# Patient Record
Sex: Female | Born: 1950 | Race: White | Hispanic: No | Marital: Married | Smoking: Current every day smoker
Health system: Southern US, Community
[De-identification: ages and names within clinical notes are randomized; demographics above are authoritative.]

## PROBLEM LIST (undated history)

## (undated) DIAGNOSIS — E1140 Type 2 diabetes mellitus with diabetic neuropathy, unspecified: Secondary | ICD-10-CM

## (undated) DIAGNOSIS — I8390 Asymptomatic varicose veins of unspecified lower extremity: Secondary | ICD-10-CM

## (undated) DIAGNOSIS — I10 Essential (primary) hypertension: Secondary | ICD-10-CM

## (undated) DIAGNOSIS — G4710 Hypersomnia, unspecified: Secondary | ICD-10-CM

## (undated) DIAGNOSIS — M19041 Primary osteoarthritis, right hand: Secondary | ICD-10-CM

## (undated) DIAGNOSIS — F319 Bipolar disorder, unspecified: Secondary | ICD-10-CM

## (undated) DIAGNOSIS — J45909 Unspecified asthma, uncomplicated: Secondary | ICD-10-CM

## (undated) DIAGNOSIS — K589 Irritable bowel syndrome without diarrhea: Secondary | ICD-10-CM

## (undated) DIAGNOSIS — E785 Hyperlipidemia, unspecified: Secondary | ICD-10-CM

## (undated) DIAGNOSIS — M170 Bilateral primary osteoarthritis of knee: Secondary | ICD-10-CM

## (undated) DIAGNOSIS — M19072 Primary osteoarthritis, left ankle and foot: Secondary | ICD-10-CM

## (undated) DIAGNOSIS — G4730 Sleep apnea, unspecified: Secondary | ICD-10-CM

## (undated) DIAGNOSIS — M1990 Unspecified osteoarthritis, unspecified site: Secondary | ICD-10-CM

## (undated) DIAGNOSIS — M791 Myalgia, unspecified site: Secondary | ICD-10-CM

## (undated) DIAGNOSIS — E119 Type 2 diabetes mellitus without complications: Secondary | ICD-10-CM

## (undated) DIAGNOSIS — M19042 Primary osteoarthritis, left hand: Secondary | ICD-10-CM

## (undated) DIAGNOSIS — Z9114 Patient's other noncompliance with medication regimen: Secondary | ICD-10-CM

## (undated) DIAGNOSIS — Z91199 Patient's noncompliance with other medical treatment and regimen due to unspecified reason: Secondary | ICD-10-CM

## (undated) DIAGNOSIS — I739 Peripheral vascular disease, unspecified: Secondary | ICD-10-CM

## (undated) DIAGNOSIS — D649 Anemia, unspecified: Secondary | ICD-10-CM

## (undated) DIAGNOSIS — M5136 Other intervertebral disc degeneration, lumbar region: Principal | ICD-10-CM

## (undated) DIAGNOSIS — M069 Rheumatoid arthritis, unspecified: Secondary | ICD-10-CM

## (undated) DIAGNOSIS — N189 Chronic kidney disease, unspecified: Secondary | ICD-10-CM

## (undated) DIAGNOSIS — S42309A Unspecified fracture of shaft of humerus, unspecified arm, initial encounter for closed fracture: Secondary | ICD-10-CM

## (undated) DIAGNOSIS — M7730 Calcaneal spur, unspecified foot: Secondary | ICD-10-CM

## (undated) DIAGNOSIS — L309 Dermatitis, unspecified: Secondary | ICD-10-CM

## (undated) DIAGNOSIS — S0300XA Dislocation of jaw, unspecified side, initial encounter: Secondary | ICD-10-CM

## (undated) DIAGNOSIS — M19071 Primary osteoarthritis, right ankle and foot: Secondary | ICD-10-CM

## (undated) DIAGNOSIS — F988 Other specified behavioral and emotional disorders with onset usually occurring in childhood and adolescence: Secondary | ICD-10-CM

## (undated) DIAGNOSIS — I499 Cardiac arrhythmia, unspecified: Secondary | ICD-10-CM

## (undated) DIAGNOSIS — K219 Gastro-esophageal reflux disease without esophagitis: Secondary | ICD-10-CM

## (undated) DIAGNOSIS — R600 Localized edema: Secondary | ICD-10-CM

## (undated) HISTORY — DX: Rheumatoid arthritis, unspecified: M06.9

## (undated) HISTORY — PX: ELBOW SURGERY: SHX618

## (undated) HISTORY — DX: Myalgia, unspecified site: M79.10

## (undated) HISTORY — DX: Hypersomnia, unspecified: G47.10

## (undated) HISTORY — DX: Dermatitis, unspecified: L30.9

## (undated) HISTORY — DX: Unspecified asthma, uncomplicated: J45.909

## (undated) HISTORY — DX: Hyperlipidemia, unspecified: E78.5

## (undated) HISTORY — DX: Primary osteoarthritis, left ankle and foot: M19.072

## (undated) HISTORY — DX: Calcaneal spur, unspecified foot: M77.30

## (undated) HISTORY — DX: Localized edema: R60.0

## (undated) HISTORY — DX: Primary osteoarthritis, right hand: M19.041

## (undated) HISTORY — PX: BACK SURGERY: SHX140

## (undated) HISTORY — DX: Primary osteoarthritis, right ankle and foot: M19.071

## (undated) HISTORY — DX: Other intervertebral disc degeneration, lumbar region: M51.36

## (undated) HISTORY — DX: Asymptomatic varicose veins of unspecified lower extremity: I83.90

## (undated) HISTORY — DX: Peripheral vascular disease, unspecified: I73.9

## (undated) HISTORY — DX: Type 2 diabetes mellitus without complications: E11.9

## (undated) HISTORY — DX: Dislocation of jaw, unspecified side, initial encounter: S03.00XA

## (undated) HISTORY — DX: Type 2 diabetes mellitus with diabetic neuropathy, unspecified: E11.40

## (undated) HISTORY — DX: Sleep apnea, unspecified: G47.30

## (undated) HISTORY — DX: Irritable bowel syndrome, unspecified: K58.9

## (undated) HISTORY — DX: Gastro-esophageal reflux disease without esophagitis: K21.9

## (undated) HISTORY — DX: Other specified behavioral and emotional disorders with onset usually occurring in childhood and adolescence: F98.8

## (undated) HISTORY — DX: Unspecified osteoarthritis, unspecified site: M19.90

## (undated) HISTORY — DX: Primary osteoarthritis, left hand: M19.042

## (undated) HISTORY — PX: ENDOVENOUS ABLATION SAPHENOUS VEIN W/ LASER: SUR449

## (undated) HISTORY — DX: Essential (primary) hypertension: I10

## (undated) HISTORY — DX: Bilateral primary osteoarthritis of knee: M17.0

---

## 2006-07-15 ENCOUNTER — Ambulatory Visit: Payer: Self-pay | Admitting: Orthopedic Surgery

## 2006-07-30 DIAGNOSIS — E119 Type 2 diabetes mellitus without complications: Secondary | ICD-10-CM | POA: Insufficient documentation

## 2006-08-22 ENCOUNTER — Ambulatory Visit: Payer: Self-pay | Admitting: Orthopedic Surgery

## 2006-09-02 ENCOUNTER — Ambulatory Visit: Payer: Self-pay

## 2006-09-26 ENCOUNTER — Ambulatory Visit: Payer: Self-pay | Admitting: Orthopedic Surgery

## 2006-12-30 ENCOUNTER — Ambulatory Visit: Payer: Self-pay | Admitting: Orthopedic Surgery

## 2007-04-24 ENCOUNTER — Ambulatory Visit: Payer: Self-pay | Admitting: Orthopedic Surgery

## 2007-04-24 DIAGNOSIS — M24519 Contracture, unspecified shoulder: Secondary | ICD-10-CM | POA: Insufficient documentation

## 2007-07-28 ENCOUNTER — Ambulatory Visit: Payer: Self-pay | Admitting: Orthopedic Surgery

## 2008-12-15 ENCOUNTER — Ambulatory Visit: Payer: Self-pay | Admitting: Vascular Surgery

## 2009-02-05 HISTORY — PX: COLONOSCOPY: SHX174

## 2009-03-16 ENCOUNTER — Ambulatory Visit: Payer: Self-pay | Admitting: Vascular Surgery

## 2009-05-04 ENCOUNTER — Ambulatory Visit: Payer: Self-pay | Admitting: Vascular Surgery

## 2009-05-11 ENCOUNTER — Ambulatory Visit: Payer: Self-pay | Admitting: Vascular Surgery

## 2010-01-10 ENCOUNTER — Ambulatory Visit: Payer: Self-pay | Admitting: Vascular Surgery

## 2010-03-08 NOTE — Assessment & Plan Note (Signed)
Summary: 3 m RECHECK ROM RT SHOULDER/BCBS/MEDICAID/CAF    History of Present Illness: I saw Margaret [name] in the office today for a 3 month followup visit.  She is a 60 years old woman with the complaint of:  right shoulder pain and adhesive capulitis.  Patiient is here today to check her ROM.  Patient states that some movements still bothers her and she states that it bothers her when she sleeps and it aches at night.    Current Allergies: No known allergies   Past Medical History:    Reviewed history from 07/30/2006 and no changes required:       Diabetes       bipolar       left upper thigh pain  Past Surgical History:    Reviewed history from 07/30/2006 and no changes required:       c-section     Review of Systems  Neuro      negative   MS      no other complaints     Shoulder/Elbow Exam  General:    Well-developed, well-nourished, normal body habitus; no deformities, normal grooming.    Skin:    Intact, no scars, lesions, rashes, cafe au lait spots or bruising.    Vascular:    Radial, ulnar, brachial, and axillary pulses 2+ and symmetric; capillary refill less than 2 seconds; no evidence of ischemia, clubbing, or cyanosis.    Sensory:    Gross sensation intact in the upper extremities.    Motor:    Normal strength in the upper extremities.    Shoulder Exam:    Right:    Inspection:  Abnormal    Palpation:  Abnormal    Stability:  stable    Tenderness:  right deltoid    Swelling:  no    Range of Motion:       Flexion-Active: 120       Flexion-Passive: 150       External Rotation : 30  Impingement Sign NEER:    Right positive Apprehension Sign:    Right negative    Impression & Recommendations:  Problem # 1:  * PAIN IN RT SHOULDER AND UPPER ARM Assessment: Improved  Orders: Est. Patient Level III (13244)  Verbal consent obtained/The right shoulder was injected with depomedrol 40mg /cc and sensorcaine .25% . There were no  complications   Problem # 2:  CONTRACTURE OF SHOULDER JOINT (ICD-718.41) Assessment: Improved adhesive capsulitis  Orders: Est. Patient Level III (01027-25) Joint Aspirate / Injection, Large (20610) Depo- Medrol 40mg  (J1030)  Orders: Est. Patient Level III (36644) Joint Aspirate / Injection, Large (20610) Depo- Medrol 40mg  (J1030)    Patient Instructions: 1)  You have received an injection of cortisone today. You may experience increased pain at the injection site. Apply ice pack to the area for 20 minutes every 2 hours and take 2 xtra strength tylenol every 8 hours. This increased pain will usually resolve in 24 hours. The injection will take effect in 3-10 days.  2)  3 months     ]

## 2010-06-20 NOTE — Consult Note (Signed)
NEW PATIENT CONSULTATION   Margaret [name], Margaret [name]  DOB:  [birth year]                                       12/15/2008  ZOXWR#:[id number]   Patient presents today for complaints of right leg venous pathology.  She is a very pleasant 60 year old female with a many-year history of  progressive pain associated with her right leg varicosities.  She has  pain mostly related to her calf and the pretibial area.  She reports  that this is severe with prolonged standing.  It can occur at night as  well after standing for prolonged periods through the day and can awaken  her at night as well.  She does have some relief with elevation.  She  has not tried compression.  She does not have any history of DVT or  superficial thrombophlebitis.  Has not had any bleeding from these  areas.   Review of systems is noted for no history of cardiac disease.  She does  not have any new weight loss.  She does report some weight gain with  limited activity.  She denies any pulmonary dysfunction.  She does have  esophageal reflux and constipation.  Negative GU.  Vascular:  Does have  pain with her walking.  Neurologic is negative for headaches, seizures,  or neurologic changes.  Musculoskeletal is positive for right joint  pain, muscle pain.  Psychiatric is for depression, anxiety.  ENT is  negative.  Hematologic and skin is positive for a rash in her left arm,  otherwise negative.   PAST MEDICAL HISTORY:  Significant for non-insulin diabetes.  She does  have a history of bipolar disorder.   Family history is negative for premature atherosclerotic disease.  She  does have a brother with venous varicosities.   SOCIAL HISTORY:  She is married with 2 children.  She is a housewife.  She does smoke a pack of cigarettes per day and does not drink alcohol  on a regular basis.   PHYSICAL EXAMINATION:  In general, she is a moderately obese white  female in no acute distress.  HEENT:  Pupils are  equal, round and  reactive to light.  Conjunctivae are clear.  Neck shows no JVD, no  cervical adenopathy.  No bruits are present.  Lungs are clear  bilaterally.  She has no rales, rhonchi or wheezes.  Cardiovascular:  Regular rate and rhythm with no murmurs.  Her peripheral pulses are 2+  in her radial and dorsalis pedis bilaterally.  Abdomen:  Soft,  nontender.  Normal bowel sounds.  No masses.  Moderate obesity.  Musculoskeletal:  No major deformities or cyanosis.  Neurologic:  No  focal weaknesses or paresthesias.  Skin:  No ulcers.  She does have the  changes on her lower extremity, most markedly on her right of very  superficial telangiectasia but are above the skin.  These are not true  varicosities but extremely prominent telangiectasia.   She underwent venous duplex in our office, and this reveals no evidence  of DVT and no evidence of deep venous valvular incompetence.  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  Her saphenous vein is not aneurysmal.   I had a long discussion with patient and her husband, present.  I did  review her duplex with her  and also reviewed her workup, as described by  Dr. Sherryll Burger in his office notes, which I reviewed with her.  I have  recommended graduated compression garments for relief of her pain.  I  explained this is related to venous hypertension.  I also explained the  potential risk for bleeding from these very superficial telangiectasia.  Fortunately, this has not been present.  I explained this frequently  happens in the shower or bathtub and can be quite significant bleeding  and can be treated simply with pressure over the bleeding site.  We have  fitted her today for thigh-high graduated compression stockings.  She  knows to continue elevation when possible and will use ibuprofen for  discomfort.  I did explain that she is an excellent candidate for laser  ablation of her right  great saphenous vein should she fail conservative  treatment and discussed this option with her as well.  She will see [place]  again in 3 months for continued discussion.   Larina Earthly, M.D.  Electronically Signed   TFE/MEDQ  D:  12/15/2008  T:  12/16/2008  Job:  [id number]   cc:   Kirstie Peri, MD

## 2010-06-20 NOTE — Assessment & Plan Note (Signed)
OFFICE VISIT   CHARLOTTE, FIDALGO  DOB:  01-10-1951                                       03/16/2009  EAVWU#:[id number]   Patient presents today for continued followup of her right leg venous  hypertension.  She has worn her graduated compression garments for 3  months and does elevate her legs when possible and takes ibuprofen for  discomfort.   DICTATION ENDED AT THIS POINT.     Larina Earthly, M.D.  Electronically Signed   TFE/MEDQ  D:  03/16/2009  T:  03/16/2009  Job:  [id number]

## 2010-06-20 NOTE — Procedures (Signed)
DUPLEX DEEP VENOUS EXAM - LOWER EXTREMITY   INDICATION:  Status post right greater saphenous vein laser ablation.   HISTORY:  Edema:  No.  Trauma/Surgery:  Right greater saphenous vein laser ablation on  05/04/2009.  Pain:  Complaint of medial right thigh and knee tenderness.  PE:  No.  Previous DVT:  No.  Anticoagulants:  Other:   DUPLEX EXAM:                CFV   SFV   PopV  PTV    GSV                R  L  R  L  R  L  R   L  R  L  Thrombosis    o  o  o     o     o      +  Spontaneous   +  +  +     +     +      o  Phasic        +  +  +     +     +      o  Augmentation  +  +  +     +     +      o  Compressible  +  +  +     +     +      o  Competent     +  +  +     +     +      o   Legend:  + - yes  o - no  p - partial  D - decreased   IMPRESSION:  1. No evidence of right lower extremity deep venous thrombosis.  2. 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  3. No reflux was noted in the right saphenofemoral junction.       _____________________________  Larina Earthly, M.D.   CH/MEDQ  D:  05/11/2009  T:  05/11/2009  Job:  [id number]

## 2010-06-20 NOTE — Assessment & Plan Note (Signed)
OFFICE VISIT   DYAMOND, TOLOSA  DOB:  10-03-1950                                       01/10/2010  JXBJY#:[id number]   The patient presents today for follow-up of lower extremity venous  problems.  She is status post laser ablation of her right great  saphenous vein and stab phlebectomies and sclerotherapy for multiple  large telangiectasias over her right medial calf.  These were raised  above the skin and had had bleeding on 1 occasion.  She has had some  mild discomfort but this is improved since her procedure.  She does have  an area over the left calf which she is concerned about as well.  She  has a birthmark at this area which is approximately 1 to 2 cm in  diameter and has developed a telangiectasia below these, which are also  raised above the surface similar to the ones that have bled in the past.  Her past medical history is otherwise unchanged.  I did image her  saphenous veins with SonoSite and this showed a complete closure of her  saphenous vein on the right from the treatment site at just below her  knee proximally.  She does have some mild incompetence on the left vein,  and I have not recommended laser treatment at this time.  I did review  her prior pictures prior to the sclerotherapy.  She does have some  hemosiderin staining at the sclerotherapy site due to the large nest of  telangiectasia that were present.  These are no longer raised and are  markedly improved from her preprocedural period.  She is interested in  sclerotherapy to the area raised at her left medial calf and the areas  around her ankle on the right that were not treated before, and we will  coordinate this with Clementeen Hoof.  I will see her again on an as-needed  basis.     Larina Earthly, M.D.  Electronically Signed   TFE/MEDQ  D:  01/10/2010  T:  01/11/2010  Job:  [id number]

## 2010-06-20 NOTE — Assessment & Plan Note (Signed)
OFFICE VISIT   [name], Margaret [name]  DOB:  [birth year]                                       03/16/2009  ZOXWR#:[id number]   The patient presents today for continued followup of her right leg  venous hypertension.  She has worn graduated compression garments for 3  months.  She reports that she continues to have difficulty despite this  and really no movement.  She does elevate her legs when possible and  does take ibuprofen for discomfort.  She reports that the pain and  swelling of the varicosities make it difficult to do her basic housework  with prolonged standing.  Also was on a walking program and had to stop  this, the length and duration of the walking due to the discomfort.  She  also reports that her usual household activities including walking up  stairs are difficult due to pain as well.  Physical exam is unchanged.  She is a moderately obese white female in no acute distress.  Blood  pressure today is 165/88, heart rate is 109, respirations 16.  Her lower  extremities reveal marked tributary varicosities over her medial calf.  She also has a significant spider vein telangiectasia over her calves as  well.  She has clearly failed conservative treatment of her venous  hypertension and venous varicosities.  I have recommended that we  proceed with laser ablation of her right great saphenous vein and stab  phlebectomy of multiple tributary varicosities.  We will schedule this  at her earliest convenience.     Larina Earthly, M.D.  Electronically Signed   TFE/MEDQ  D:  03/16/2009  T:  03/16/2009  Job:  [id number]

## 2010-06-20 NOTE — Procedures (Signed)
LOWER EXTREMITY VENOUS REFLUX EXAM   INDICATION:  Bilateral lower extremity swelling, pain, and varicose  veins.   EXAM:  Using color-flow imaging and pulse Doppler spectral analysis, the  bilateral common femoral, superficial femoral, popliteal, posterior  tibial, greater and lesser saphenous veins are evaluated.  There is no  evidence suggesting deep venous insufficiency in the bilateral lower  extremities.   The bilateral saphenofemoral junction is not competent with Reflux of  >557milliseconds. The bilateral GSV's are not competent with Reflux of  >549milliseconds with the caliber as described below.   The bilateral proximal short saphenous veins demonstrate competency.   GSV Diameter (used if found to be incompetent only)                                            Right    Left  Proximal Greater Saphenous Vein           0.67 cm  0.60 cm  Proximal-to-mid-thigh                     0.61 cm  0.45 cm  Mid thigh                                 0.48 cm  0.47 cm  Mid-distal thigh                          cm       cm  Distal thigh                              0.55 cm  0.27 cm  Knee                                      0.48 cm  0.28 cm   IMPRESSION:  1. Bilateral greater saphenous vein Reflux with >553milliseconds is      identified with the caliber ranging from 0.48 cm to 0.67 cm knee to      groin on the right and 0.28 to 0.60 cm on the left.  2. The bilateral left greater saphenous vein is not aneurysmal.  3. The bilateral greater saphenous veins are not tortuous.  4. The deep venous system is competent.  5. An incompetent perforator on the right medial proximal calf and      posterior calves are present.          ___________________________________________  Larina Earthly, M.D.   CJ/MEDQ  D:  12/15/2008  T:  12/15/2008  Job:  [id number]

## 2010-06-20 NOTE — Assessment & Plan Note (Signed)
OFFICE VISIT   Margaret [name], Margaret [name]  DOB:  1950-05-29                                       05/04/2009  ZOXWR#:[id number]   This patient presents today for laser ablation of her right great  saphenous vein from below her knee to her saphenofemoral junction, stab  phlebectomy to multiple tributaries in her calf and also sclerotherapy  to areas of her calf that were also engorged and painful.  She has no  immediate complications and will be seen in 1 week for followup.     Larina Earthly, M.D.  Electronically Signed   TFE/MEDQ  D:  05/04/2009  T:  05/05/2009  Job:  [id number]

## 2010-06-20 NOTE — Assessment & Plan Note (Signed)
OFFICE VISIT   Margaret [name], Margaret [name]  DOB:  December 16, 1950                                       05/11/2009  ZOXWR#:[id number]   The patient presents today for one week followup of her laser ablation  of right great saphenous vein and stab phlebectomy of multiple  tributaries varicosities.  She has the usual amount of soreness at the  ablation site.  Her stab sites look quite good with mild bruising and  her sclerotherapy sites look quite good as well.  She underwent  noninvasive vascular laboratory studies in our office today and this  reveals ablation of her saphenous vein from just below her knee up to  her saphenofemoral junction with no evidence of DVT.  I am pleased with  her initial response as is the patient and plan to see her again in 6  weeks for final followup.     Larina Earthly, M.D.  Electronically Signed   TFE/MEDQ  D:  05/11/2009  T:  05/12/2009  Job:  [id number]   cc:   Kirstie Peri, MD

## 2010-08-18 ENCOUNTER — Other Ambulatory Visit: Payer: Self-pay | Admitting: Unknown Physician Specialty

## 2010-08-18 DIAGNOSIS — Z1231 Encounter for screening mammogram for malignant neoplasm of breast: Secondary | ICD-10-CM

## 2010-09-11 ENCOUNTER — Ambulatory Visit
Admission: RE | Admit: 2010-09-11 | Discharge: 2010-09-11 | Disposition: A | Discharge status: Home / Self Care | Payer: Medicare Other | Source: Ambulatory Visit | Attending: Unknown Physician Specialty | Admitting: Unknown Physician Specialty

## 2010-09-11 DIAGNOSIS — Z1231 Encounter for screening mammogram for malignant neoplasm of breast: Secondary | ICD-10-CM

## 2011-09-25 ENCOUNTER — Other Ambulatory Visit: Payer: Self-pay | Admitting: Unknown Physician Specialty

## 2011-09-25 DIAGNOSIS — Z1231 Encounter for screening mammogram for malignant neoplasm of breast: Secondary | ICD-10-CM

## 2011-10-11 ENCOUNTER — Ambulatory Visit
Admission: RE | Admit: 2011-10-11 | Discharge: 2011-10-11 | Disposition: A | Discharge status: Home / Self Care | Payer: Medicare Other | Source: Ambulatory Visit | Attending: Unknown Physician Specialty | Admitting: Unknown Physician Specialty

## 2011-10-11 DIAGNOSIS — Z1231 Encounter for screening mammogram for malignant neoplasm of breast: Secondary | ICD-10-CM

## 2012-10-16 ENCOUNTER — Other Ambulatory Visit: Payer: Self-pay

## 2012-10-16 DIAGNOSIS — Z1231 Encounter for screening mammogram for malignant neoplasm of breast: Secondary | ICD-10-CM

## 2012-11-17 ENCOUNTER — Ambulatory Visit: Payer: Medicare Other

## 2012-11-20 ENCOUNTER — Ambulatory Visit
Admission: RE | Admit: 2012-11-20 | Discharge: 2012-11-20 | Disposition: A | Discharge status: Home / Self Care | Payer: Medicare PPO | Source: Ambulatory Visit

## 2012-11-20 DIAGNOSIS — Z1231 Encounter for screening mammogram for malignant neoplasm of breast: Secondary | ICD-10-CM

## 2013-02-05 DIAGNOSIS — G4730 Sleep apnea, unspecified: Secondary | ICD-10-CM

## 2013-02-05 HISTORY — DX: Sleep apnea, unspecified: G47.30

## 2013-04-08 DIAGNOSIS — N903 Dysplasia of vulva, unspecified: Secondary | ICD-10-CM | POA: Insufficient documentation

## 2013-11-30 ENCOUNTER — Other Ambulatory Visit: Payer: Self-pay

## 2013-11-30 DIAGNOSIS — Z1231 Encounter for screening mammogram for malignant neoplasm of breast: Secondary | ICD-10-CM

## 2013-12-17 ENCOUNTER — Ambulatory Visit
Admission: RE | Admit: 2013-12-17 | Discharge: 2013-12-17 | Disposition: A | Discharge status: Home / Self Care | Payer: Medicare PPO | Source: Ambulatory Visit

## 2013-12-17 DIAGNOSIS — Z1231 Encounter for screening mammogram for malignant neoplasm of breast: Secondary | ICD-10-CM

## 2014-04-20 ENCOUNTER — Other Ambulatory Visit (HOSPITAL_COMMUNITY): Payer: Self-pay | Admitting: Internal Medicine

## 2014-04-20 DIAGNOSIS — N644 Mastodynia: Secondary | ICD-10-CM

## 2014-04-22 ENCOUNTER — Other Ambulatory Visit (HOSPITAL_COMMUNITY): Payer: Self-pay | Admitting: Internal Medicine

## 2014-04-22 ENCOUNTER — Ambulatory Visit (HOSPITAL_COMMUNITY)
Admission: RE | Admit: 2014-04-22 | Discharge: 2014-04-22 | Disposition: A | Discharge status: Home / Self Care | Payer: Medicare PPO | Source: Ambulatory Visit | Attending: Internal Medicine | Admitting: Internal Medicine

## 2014-04-22 DIAGNOSIS — N644 Mastodynia: Secondary | ICD-10-CM

## 2014-04-22 DIAGNOSIS — M25552 Pain in left hip: Secondary | ICD-10-CM

## 2014-10-26 ENCOUNTER — Encounter: Payer: Self-pay | Admitting: Vascular Surgery

## 2014-10-26 ENCOUNTER — Other Ambulatory Visit: Payer: Self-pay | Admitting: *Deleted

## 2014-10-26 DIAGNOSIS — M79605 Pain in left leg: Secondary | ICD-10-CM

## 2014-10-26 DIAGNOSIS — M79604 Pain in right leg: Secondary | ICD-10-CM

## 2014-10-27 ENCOUNTER — Encounter: Payer: Self-pay | Admitting: Vascular Surgery

## 2014-12-10 ENCOUNTER — Encounter: Payer: Self-pay | Admitting: Vascular Surgery

## 2014-12-14 ENCOUNTER — Ambulatory Visit (HOSPITAL_COMMUNITY)
Admission: RE | Admit: 2014-12-14 | Discharge: 2014-12-14 | Disposition: A | Discharge status: Home / Self Care | Payer: Medicare PPO | Source: Ambulatory Visit | Attending: Vascular Surgery | Admitting: Vascular Surgery

## 2014-12-14 ENCOUNTER — Ambulatory Visit (INDEPENDENT_AMBULATORY_CARE_PROVIDER_SITE_OTHER): Payer: Medicare PPO | Admitting: Vascular Surgery

## 2014-12-14 ENCOUNTER — Encounter: Payer: Self-pay | Admitting: Vascular Surgery

## 2014-12-14 VITALS — BP 122/68 | HR 89 | Temp 97.1°F | Resp 14 | Ht 62.5 in | Wt 198.0 lb

## 2014-12-14 DIAGNOSIS — M79605 Pain in left leg: Secondary | ICD-10-CM | POA: Diagnosis not present

## 2014-12-14 DIAGNOSIS — M25561 Pain in right knee: Secondary | ICD-10-CM | POA: Diagnosis not present

## 2014-12-14 DIAGNOSIS — M79604 Pain in right leg: Secondary | ICD-10-CM | POA: Diagnosis not present

## 2014-12-14 DIAGNOSIS — M25562 Pain in left knee: Secondary | ICD-10-CM | POA: Diagnosis not present

## 2014-12-14 NOTE — Progress Notes (Signed)
V[facility] and Vein Specialist of [facility]  Patient name: Margaret [name] MRN: [id number] DOB: 1950-12-23 Sex: female  REASON FOR CONSULT: Bilateral lower extremity pain and cyanosis to me from prior endovenous treatment of saphenous vein with ablation. This was approximately 2011. She presents now with multiple complaints regarding her lower extremities. She reports that she has tenderness in the discomfort and not to touch and at rest on both lower extremity 6 sitting throughout her legs down into her feet. She also reports that her feet remain cold to the touch and she has dependent rubor but this can occur with her legs elevated as well. She does have degenerative disc disease with back pain that this can extend into her legs as well. Has no history of heart disease and no history of lower from the arterial insufficiency. She is a diabetic. A portion she does smoke pack cigarettes per day    Past Medical History  Diagnosis Date  . Diabetes mellitus without complication (HCC)   . Arthritis   . Hyperlipidemia   . Peripheral vascular disease (HCC)   . Varicose veins   . Diabetic neuropathy (HCC)   . Benign essential hypertension   . Hypersomnia   . Gastroesophageal reflux disease   . IBS (irritable bowel syndrome)   . Eczema   . Sleep apnea   . Myalgia   . Asthma     Family History  Problem Relation Age of Onset  . Hypertension Father   . AAA (abdominal aortic aneurysm) Brother     SOCIAL HISTORY: Social History   Social History  . Marital Status: Married    Spouse Name: N/A  . Number of Children: N/A  . Years of Education: N/A   Occupational History  . Not on file.   Social History Main Topics  . Smoking status: Current Every Day Smoker -- 1.00 packs/day    Types: Cigarettes  . Smokeless tobacco: Never Used  . Alcohol Use: No  . Drug Use: No  . Sexual Activity: Not on file   Other Topics Concern  . Not on file   Social History Narrative    Allergies    Allergen Reactions  . Hydroxychloroquine Rash  . Metformin And Related Diarrhea  . Vicodin [Hydrocodone-Acetaminophen] Nausea Only    Current Outpatient Prescriptions  Medication Sig Dispense Refill  . ALPRAZolam (XANAX) 0.5 MG tablet Take 0.5 mg by mouth.    Marland Kitchen aspirin 81 MG tablet Take 81 mg by mouth daily.    Marland Kitchen atomoxetine (STRATTERA) 60 MG capsule Take 60 mg by mouth.    Marland Kitchen buPROPion (WELLBUTRIN XL) 300 MG 24 hr tablet Take 300 mg by mouth.    . diltiazem (TAZTIA XT) 180 MG 24 hr capsule Take 180 mg by mouth.    Marland Kitchen glimepiride (AMARYL) 4 MG tablet Take 4 mg by mouth daily with breakfast. Take 1/2  -1 (one) every morning per medication list from [facility] notes.    Marland Kitchen glimepiride (AMARYL) 4 MG tablet Take 4 mg by mouth.    . Insulin Pen Needle (NOVOFINE) 30G X 8 MM MISC Inject 1 packet into the skin daily.    . Liraglutide (VICTOZA) 18 MG/3ML SOPN Inject 1.8 mg into the skin.    Marland Kitchen lisinopril (PRINIVIL,ZESTRIL) 5 MG tablet Take 5 mg by mouth.    . metFORMIN (GLUCOPHAGE) 500 MG tablet     . METFORMIN HCL PO Take 500 mg by mouth 2 (two) times daily.    Marland Kitchen  pantoprazole (PROTONIX) 40 MG tablet Take 40 mg by mouth.    . sulfaSALAzine (AZULFIDINE) 500 MG EC tablet     . sulfaSALAzine (AZULFIDINE) 500 MG tablet Take 1,000 mg by mouth.    . topiramate (TOPAMAX) 200 MG tablet Take 200 mg by mouth.    . clotrimazole-betamethasone (LOTRISONE) cream Apply 1 application topically 2 (two) times daily.     No current facility-administered medications for this visit.    REVIEW OF SYSTEMS:  [X]  denotes positive finding, [ ]  denotes negative finding Cardiac  Comments:  Chest pain or chest pressure:    Shortness of breath upon exertion:    Short of breath when lying flat:    Irregular heart rhythm:        Vascular    Pain in calf, thigh, or hip brought on by ambulation:    Pain in feet at night that wakes you up from your sleep:     Blood clot in your veins:    Leg swelling:          Pulmonary    Oxygen at home:    Productive cough:     Wheezing:         Neurologic    Sudden weakness in arms or legs:     Sudden numbness in arms or legs:     Sudden onset of difficulty speaking or slurred speech:    Temporary loss of vision in one eye:     Problems with dizziness:         Gastrointestinal    Blood in stool:     Vomited blood:         Genitourinary    Burning when urinating:     Blood in urine:        Psychiatric    Major depression:  x       Hematologic    Bleeding problems:    Problems with blood clotting too easily:        Skin    Rashes or ulcers:        Constitutional    Fever or chills:      PHYSICAL EXAM: Filed Vitals:   12/14/14 0937  BP: 122/68  Pulse: 89  Temp: 97.1 F (36.2 C)  Resp: 14  Height: 5' 2.5" (1.588 m)  Weight: 198 lb (89.812 kg)  SpO2: 97%    GENERAL: The patient is a well-nourished female, in no acute distress. The vital signs are documented above. CARDIAC: There is a regular rate and rhythm.  VASCULAR: Palpable dorsalis pedis pulses bilaterally. 2+ radial pulses bilaterally PULMONARY: There is good air exchange bilaterally without wheezing or rales. ABDOMEN: Soft and non-tender with normal pitched bowel sounds.  MUSCULOSKELETAL: There are no major deformities NEUROLOGIC: No focal weakness or paresthesias are detected. SKIN: There are no ulcers or rashes noted. She does have a red discoloration in her feet and extending into her toes bilaterally. No ulceration PSYCHIATRIC: The patient has a normal affect.  DATA:  Noninvasive arterial studies reveal ankle arm index diminished at 0.70 on the right and normal at greater than 1 on the left. He does have monophasic flows of the tibial vessels on the right leg and normal biphasic and triphasic flows on the left.  MEDICAL ISSUES: Had long discussion with patient and her husband present. I did explain that she does have mild arterial insufficiency left leg and normal  arterial flow in her right leg. Her symptoms are equal right and left. He  does not have any significant swelling in her lower extremities. I do not feel this is related to venous hypertension. The digital waveforms in her toes bilaterally are flattened suggestive of either vasospasm or small vessel disease in the toes of her feet bilaterally. Explained there would be no treatment option for this. 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 I did reassure her that this is not limb threatening but do not see any options from a vascular stent for for further evaluation or treatment. She does have multiple other etiologies that would explain her leg pain. She will continue follow-up with her other providers and see [place] again on as-needed basis   Kalianne Fetting [facility] [employer]: [phone]

## 2015-01-12 ENCOUNTER — Other Ambulatory Visit: Payer: Self-pay | Admitting: Neurosurgery

## 2015-01-20 ENCOUNTER — Encounter (HOSPITAL_COMMUNITY)
Admission: RE | Admit: 2015-01-20 | Discharge: 2015-01-20 | Disposition: A | Discharge status: Home / Self Care | Payer: Medicare PPO | Source: Ambulatory Visit | Attending: Neurosurgery | Admitting: Neurosurgery

## 2015-01-20 ENCOUNTER — Encounter (HOSPITAL_COMMUNITY): Payer: Self-pay

## 2015-01-20 DIAGNOSIS — Z01818 Encounter for other preprocedural examination: Secondary | ICD-10-CM | POA: Diagnosis not present

## 2015-01-20 DIAGNOSIS — Z0183 Encounter for blood typing: Secondary | ICD-10-CM | POA: Diagnosis not present

## 2015-01-20 DIAGNOSIS — Z01812 Encounter for preprocedural laboratory examination: Secondary | ICD-10-CM | POA: Diagnosis not present

## 2015-01-20 DIAGNOSIS — M4310 Spondylolisthesis, site unspecified: Secondary | ICD-10-CM | POA: Insufficient documentation

## 2015-01-20 HISTORY — DX: Bipolar disorder, unspecified: F31.9

## 2015-01-20 HISTORY — DX: Patient's other noncompliance with medication regimen: Z91.14

## 2015-01-20 HISTORY — DX: Cardiac arrhythmia, unspecified: I49.9

## 2015-01-20 HISTORY — DX: Patient's noncompliance with other medical treatment and regimen due to unspecified reason: Z91.199

## 2015-01-20 HISTORY — DX: Rheumatoid arthritis, unspecified: M06.9

## 2015-01-20 LAB — CBC
HEMATOCRIT: 43.6 % (ref 36.0–46.0)
Hemoglobin: 14.4 g/dL (ref 12.0–15.0)
MCH: 32.6 pg (ref 26.0–34.0)
MCHC: 33 g/dL (ref 30.0–36.0)
MCV: 98.6 fL (ref 78.0–100.0)
Platelets: 224 10*3/uL (ref 150–400)
RBC: 4.42 MIL/uL (ref 3.87–5.11)
RDW: 13.1 % (ref 11.5–15.5)
WBC: 8.5 10*3/uL (ref 4.0–10.5)

## 2015-01-20 LAB — SURGICAL PCR SCREEN
MRSA, PCR: NEGATIVE
STAPHYLOCOCCUS AUREUS: NEGATIVE

## 2015-01-20 LAB — BASIC METABOLIC PANEL
Anion gap: 10 (ref 5–15)
BUN: 22 mg/dL — ABNORMAL HIGH (ref 6–20)
CALCIUM: 9.4 mg/dL (ref 8.9–10.3)
CO2: 20 mmol/L — AB (ref 22–32)
CREATININE: 0.95 mg/dL (ref 0.44–1.00)
Chloride: 112 mmol/L — ABNORMAL HIGH (ref 101–111)
GFR calc non Af Amer: >60 mL/min (ref >60)
GLUCOSE: 132 mg/dL — AB (ref 65–99)
Potassium: 4.2 mmol/L (ref 3.5–5.1)
Sodium: 142 mmol/L (ref 135–145)

## 2015-01-20 LAB — TYPE AND SCREEN
ABO/RH(D): A POS
ANTIBODY SCREEN: NEGATIVE

## 2015-01-20 LAB — ABO/RH: ABO/RH(D): A POS

## 2015-01-20 LAB — GLUCOSE, CAPILLARY: Glucose-Capillary: 142 mg/dL — ABNORMAL HIGH (ref 65–99)

## 2015-01-20 NOTE — Progress Notes (Signed)
Patient denies ever having seen a cardiologist for any reason or having any cardiac testing done.    Patient's PCP is Kirstie Peri, MD at [facility].  Patient sees Dr. Corliss Skains at [facility] for management of Rheumatoid Arthritis.

## 2015-01-20 NOTE — Pre-Procedure Instructions (Signed)
Huldah Marin [facility]  01/20/2015      [facility] [address], [place] - [address] Dol[place]is [address] [place] [address] Phone: [phone] Fax: [phone]    Your procedure is scheduled on Monday December 19th 2016 at 1210pm.  Report to [facility] Admitting at 1000 A.M.  Call this number if you have problems in the days leading up to your surgery:  [phone]  Call this number if you have problems the morning of surgery:  [phone]   Remember:  Do not eat food or drink liquids after midnight Sunday December 18th.  Take these medicines the morning of surgery with A SIP OF WATER alprazolam (xanax) if needed, atomoxetine (strattera) if needed, bupropion (wellbutrin) if needed, pantoprazole (protonix) if needed. Please be sure to take your diltiazem (taztia xt) and your topiramate (topamax) if you normally take them in the morning.   Please stop your sulfasalazine now.  STOP: ALL Vitamins, Supplements, Effient and Herbal Medications, Fish Oils, Aspirins, NSAIDs (Nonsteroidal Anti-inflammatories such as Ibuprofen, Aleve, or Advil), and Goody's/BC Powders 7 days prior to surgery, until after surgery as directed by your physician.    How to Manage Your Diabetes Before Surgery   Why is it important to control my blood sugar before and after surgery?   Improving blood sugar levels before and after surgery helps healing and can limit problems.  A way of improving blood sugar control is eating a healthy diet by:  - Eating less sugar and carbohydrates  - Increasing activity/exercise  - Talk with your doctor about reaching your blood sugar goals  High blood sugars (greater than 180 mg/dL) can raise your risk of infections and slow down your recovery so you will need to focus on controlling your diabetes during the weeks before surgery.  Make sure that the doctor who takes care of your diabetes knows about your planned surgery including the date and location.  How do  I manage my blood sugars before surgery?   Check your blood sugar at least 4 times a day, 2 days before surgery to make sure that they are not too high or low.   Check your blood sugar the morning of your surgery when you wake up and every 2               hours until you get to the Short-Stay unit.  If your blood sugar is less than 70 mg/dL, you will need to treat for low blood sugar by:  Treat a low blood sugar (less than 70 mg/dL) with 1/2 cup of clear juice (cranberry or apple), 4 glucose tablets, OR glucose gel.  Recheck blood sugar in 15 minutes after treatment (to make sure it is greater than 70 mg/dL).  If blood sugar is not greater than 70 mg/dL on re-check, call [phone] for further instructions.   Report your blood sugar to the Short-Stay nurse when you get to Short-Stay.  References:  [facility], 2007 "How to Manage your Diabetes Before and After Surgery".  What do I do about my diabetes medications?   Do not take oral diabetes medicines (pills) the morning of surgery.   THE MORNING OF SURGERY, Do not take any of your diabetes medications.  This includes: glimeripide (amaryl), liraglutide (victoza), and metformin (glucophage).      Do not take other diabetes injectables the day of surgery including Byetta, Victoza, Bydureon, and Trulicity.    If your CBG is  greater than 220 mg/dL, you may take 1/2 of your sliding scale (correction) dose of insulin.    Do not wear jewelry, make-up or nail polish.  Do not wear lotions, powders, or perfumes.  You may wear deodorant.  Do not shave 48 hours prior to surgery.  Men may shave face and neck.  Do not bring valuables to the hospital.  [facility] is not responsible for any belongings or valuables.  Contacts, dentures or bridgework may not be worn into surgery.  Leave your suitcase in the car.  After surgery it may be brought to your room.  For patients admitted to the hospital, discharge time  will be determined by your treatment team.  Patients discharged the day of surgery will not be allowed to drive home.    Please read over the following fact sheets that you were given. Pain Booklet, Coughing and Deep Breathing, Blood Transfusion Information, MRSA Information and Surgical Site Infection Prevention        Preparing for Surgery at [facility]  Before surgery, you can play an important role.  Because skin is not sterile, your skin needs to be as free of germs as possible.  You can reduce the number of germs on your skin by washing with CHG (chlorahexidine gluconate) Soap before surgery.  CHG is an antiseptic cleaner with kills germs and bonds with the skin to continue killing germs even after washing.   Please do not use if you have an allergy to CHG or antibacterial soaps.  If your skin becomes reddened/irritated stop using the CHG.  Do not shave (including legs and underarms) for at least 48 hours prior to first CHG shower.  It is okay to shave your face.  Please follow these instructions carefully:  1. Shower with CHG Soap the night before surgery and the morning of Surgery. 2. If you choose to wash your hair, wash your hair first as usual with your normal shampoo. 3. After you shampoo, rinse your hair and body thoroughly to remove the Shampoo. 4. Use CHG as you would any other liquid soap. You can apply chg directly to the skin and wash gently with scrungie or a clean washcloth. 5. Apply the CHG Soap to your body ONLY FROM THE NECK DOWN. Do not use on open wounds or open sores. Avoid contact with your eyes, ears, mouth and genitals (private parts). Wash genitals (private parts) with your normal soap. 6. Wash thoroughly, paying special attention to the area where your surgery will be performed. 7. Thoroughly rinse your body with warm water from the neck down. 8. DO NOT  shower/wash with your normal soap after using and rinsing off the CHG Soap. 9. Pat yourself dry with a clean towel.  10. Wear clean pajamas.  11. Place clean sheets on your bed the night of your first shower and do not sleep with pets.  Day of Surgery  Do not apply any lotions/deodorants the morning of surgery. Please wear clean clothes to the hospital/surgery center.

## 2015-01-21 LAB — HEMOGLOBIN A1C
Hgb A1c MFr Bld: 6.7 % — ABNORMAL HIGH (ref 4.8–5.6)
Mean Plasma Glucose: 146 mg/dL

## 2015-01-23 MED ORDER — CEFAZOLIN SODIUM-DEXTROSE 2-3 GM-% IV SOLR
2.0000 g | INTRAVENOUS | Status: AC
  Administered 2015-01-24: 2 g via INTRAVENOUS
  Filled 2015-01-23 (×2): qty 50

## 2015-01-24 ENCOUNTER — Encounter (HOSPITAL_COMMUNITY): Payer: Self-pay | Admitting: *Deleted

## 2015-01-24 ENCOUNTER — Encounter (HOSPITAL_COMMUNITY)
Admission: RE | Disposition: A | Discharge status: Home / Self Care | Payer: Self-pay | Source: Ambulatory Visit | Attending: Neurosurgery

## 2015-01-24 ENCOUNTER — Inpatient Hospital Stay (HOSPITAL_COMMUNITY): Payer: Medicare PPO | Admitting: Certified Registered Nurse Anesthetist

## 2015-01-24 ENCOUNTER — Inpatient Hospital Stay (HOSPITAL_COMMUNITY)
Admission: RE | Admit: 2015-01-24 | Discharge: 2015-01-25 | DRG: 460 | Disposition: A | Discharge status: Home / Self Care | Payer: Medicare PPO | Source: Ambulatory Visit | Attending: Neurosurgery | Admitting: Neurosurgery

## 2015-01-24 ENCOUNTER — Inpatient Hospital Stay (HOSPITAL_COMMUNITY): Payer: Medicare PPO

## 2015-01-24 DIAGNOSIS — Z9119 Patient's noncompliance with other medical treatment and regimen: Secondary | ICD-10-CM | POA: Diagnosis not present

## 2015-01-24 DIAGNOSIS — E1151 Type 2 diabetes mellitus with diabetic peripheral angiopathy without gangrene: Secondary | ICD-10-CM | POA: Diagnosis present

## 2015-01-24 DIAGNOSIS — M4316 Spondylolisthesis, lumbar region: Secondary | ICD-10-CM | POA: Diagnosis present

## 2015-01-24 DIAGNOSIS — Z888 Allergy status to other drugs, medicaments and biological substances status: Secondary | ICD-10-CM

## 2015-01-24 DIAGNOSIS — E785 Hyperlipidemia, unspecified: Secondary | ICD-10-CM | POA: Diagnosis present

## 2015-01-24 DIAGNOSIS — M069 Rheumatoid arthritis, unspecified: Secondary | ICD-10-CM | POA: Diagnosis present

## 2015-01-24 DIAGNOSIS — J45909 Unspecified asthma, uncomplicated: Secondary | ICD-10-CM | POA: Diagnosis present

## 2015-01-24 DIAGNOSIS — M79606 Pain in leg, unspecified: Secondary | ICD-10-CM | POA: Diagnosis present

## 2015-01-24 DIAGNOSIS — M4806 Spinal stenosis, lumbar region: Secondary | ICD-10-CM | POA: Diagnosis present

## 2015-01-24 DIAGNOSIS — K589 Irritable bowel syndrome without diarrhea: Secondary | ICD-10-CM | POA: Diagnosis present

## 2015-01-24 DIAGNOSIS — I10 Essential (primary) hypertension: Secondary | ICD-10-CM | POA: Diagnosis present

## 2015-01-24 DIAGNOSIS — F17200 Nicotine dependence, unspecified, uncomplicated: Secondary | ICD-10-CM | POA: Diagnosis present

## 2015-01-24 DIAGNOSIS — M1990 Unspecified osteoarthritis, unspecified site: Secondary | ICD-10-CM | POA: Diagnosis present

## 2015-01-24 DIAGNOSIS — Z885 Allergy status to narcotic agent status: Secondary | ICD-10-CM

## 2015-01-24 DIAGNOSIS — G4730 Sleep apnea, unspecified: Secondary | ICD-10-CM | POA: Diagnosis present

## 2015-01-24 DIAGNOSIS — K219 Gastro-esophageal reflux disease without esophagitis: Secondary | ICD-10-CM | POA: Diagnosis present

## 2015-01-24 DIAGNOSIS — E1140 Type 2 diabetes mellitus with diabetic neuropathy, unspecified: Secondary | ICD-10-CM | POA: Diagnosis present

## 2015-01-24 DIAGNOSIS — Z419 Encounter for procedure for purposes other than remedying health state, unspecified: Secondary | ICD-10-CM

## 2015-01-24 DIAGNOSIS — Z79899 Other long term (current) drug therapy: Secondary | ICD-10-CM | POA: Diagnosis not present

## 2015-01-24 DIAGNOSIS — Z7984 Long term (current) use of oral hypoglycemic drugs: Secondary | ICD-10-CM | POA: Diagnosis not present

## 2015-01-24 DIAGNOSIS — Z7982 Long term (current) use of aspirin: Secondary | ICD-10-CM | POA: Diagnosis not present

## 2015-01-24 DIAGNOSIS — F319 Bipolar disorder, unspecified: Secondary | ICD-10-CM | POA: Diagnosis present

## 2015-01-24 LAB — GLUCOSE, CAPILLARY
GLUCOSE-CAPILLARY: 164 mg/dL — AB (ref 65–99)
GLUCOSE-CAPILLARY: 206 mg/dL — AB (ref 65–99)

## 2015-01-24 LAB — CBC
HCT: 38.3 % (ref 36.0–46.0)
Hemoglobin: 12.4 g/dL (ref 12.0–15.0)
MCH: 32.2 pg (ref 26.0–34.0)
MCHC: 32.4 g/dL (ref 30.0–36.0)
MCV: 99.5 fL (ref 78.0–100.0)
PLATELETS: 181 10*3/uL (ref 150–400)
RBC: 3.85 MIL/uL — AB (ref 3.87–5.11)
RDW: 13.1 % (ref 11.5–15.5)
WBC: 14.3 10*3/uL — AB (ref 4.0–10.5)

## 2015-01-24 LAB — CREATININE, SERUM
CREATININE: 1.01 mg/dL — AB (ref 0.44–1.00)
GFR calc non Af Amer: 58 mL/min — ABNORMAL LOW (ref >60)

## 2015-01-24 SURGERY — POSTERIOR LUMBAR FUSION 1 LEVEL
Anesthesia: General | Site: Spine Lumbar

## 2015-01-24 MED ORDER — PROPOFOL 10 MG/ML IV BOLUS
INTRAVENOUS | Status: AC
  Filled 2015-01-24: qty 20

## 2015-01-24 MED ORDER — ALBUMIN HUMAN 5 % IV SOLN
INTRAVENOUS | Status: DC | PRN
  Administered 2015-01-24: 15:00:00 via INTRAVENOUS

## 2015-01-24 MED ORDER — DILTIAZEM HCL ER BEADS 180 MG PO CP24: 180.0000 mg | ORAL_CAPSULE | Freq: Every day | ORAL | Status: DC

## 2015-01-24 MED ORDER — BUPROPION HCL ER (XL) 300 MG PO TB24
300.0000 mg | ORAL_TABLET | Freq: Every day | ORAL | Status: DC
  Administered 2015-01-25: 300 mg via ORAL
  Filled 2015-01-24: qty 1

## 2015-01-24 MED ORDER — FENTANYL CITRATE (PF) 100 MCG/2ML IJ SOLN
25.0000 ug | INTRAMUSCULAR | Status: DC | PRN
  Administered 2015-01-24 (×3): 50 ug via INTRAVENOUS

## 2015-01-24 MED ORDER — ACETAMINOPHEN 325 MG PO TABS: 650.0000 mg | ORAL_TABLET | ORAL | Status: DC | PRN

## 2015-01-24 MED ORDER — GLYCOPYRROLATE 0.2 MG/ML IJ SOLN
INTRAMUSCULAR | Status: DC | PRN
  Administered 2015-01-24: 0.4 mg via INTRAVENOUS

## 2015-01-24 MED ORDER — TOPIRAMATE 100 MG PO TABS
200.0000 mg | ORAL_TABLET | Freq: Two times a day (BID) | ORAL | Status: DC
  Administered 2015-01-24 – 2015-01-25 (×2): 200 mg via ORAL
  Filled 2015-01-24 (×3): qty 2

## 2015-01-24 MED ORDER — SODIUM CHLORIDE 0.9 % IJ SOLN: 3.0000 mL | Freq: Two times a day (BID) | INTRAMUSCULAR | Status: DC

## 2015-01-24 MED ORDER — ALPRAZOLAM 0.5 MG PO TABS: 0.5000 mg | ORAL_TABLET | Freq: Two times a day (BID) | ORAL | Status: DC | PRN

## 2015-01-24 MED ORDER — ONDANSETRON HCL 4 MG/2ML IJ SOLN: 4.0000 mg | INTRAMUSCULAR | Status: DC | PRN

## 2015-01-24 MED ORDER — ATOMOXETINE HCL 60 MG PO CAPS
60.0000 mg | ORAL_CAPSULE | Freq: Every day | ORAL | Status: DC
  Administered 2015-01-25: 60 mg via ORAL
  Filled 2015-01-24: qty 1

## 2015-01-24 MED ORDER — GLIMEPIRIDE 4 MG PO TABS
4.0000 mg | ORAL_TABLET | Freq: Every day | ORAL | Status: DC
  Administered 2015-01-25: 4 mg via ORAL
  Filled 2015-01-24 (×2): qty 1

## 2015-01-24 MED ORDER — ONDANSETRON HCL 4 MG/2ML IJ SOLN
INTRAMUSCULAR | Status: DC | PRN
  Administered 2015-01-24: 4 mg via INTRAVENOUS

## 2015-01-24 MED ORDER — METHOCARBAMOL 500 MG PO TABS
500.0000 mg | ORAL_TABLET | Freq: Four times a day (QID) | ORAL | Status: DC | PRN
Start: 2015-01-24 — End: 2015-01-25
  Administered 2015-01-24 – 2015-01-25 (×3): 500 mg via ORAL
  Filled 2015-01-24 (×2): qty 1

## 2015-01-24 MED ORDER — OXYCODONE-ACETAMINOPHEN 5-325 MG PO TABS
ORAL_TABLET | ORAL | Status: AC
  Filled 2015-01-24: qty 1

## 2015-01-24 MED ORDER — BUPIVACAINE HCL (PF) 0.5 % IJ SOLN
INTRAMUSCULAR | Status: DC | PRN
  Administered 2015-01-24: 5 mL

## 2015-01-24 MED ORDER — SODIUM CHLORIDE 0.9 % IJ SOLN: 3.0000 mL | INTRAMUSCULAR | Status: DC | PRN

## 2015-01-24 MED ORDER — FENTANYL CITRATE (PF) 250 MCG/5ML IJ SOLN
INTRAMUSCULAR | Status: AC
  Filled 2015-01-24: qty 5

## 2015-01-24 MED ORDER — LIRAGLUTIDE 18 MG/3ML ~~LOC~~ SOPN: 1.8000 mg | PEN_INJECTOR | Freq: Every day | SUBCUTANEOUS | Status: DC

## 2015-01-24 MED ORDER — CEFAZOLIN SODIUM-DEXTROSE 2-3 GM-% IV SOLR
2.0000 g | Freq: Three times a day (TID) | INTRAVENOUS | Status: AC
  Administered 2015-01-24 – 2015-01-25 (×2): 2 g via INTRAVENOUS
  Filled 2015-01-24 (×2): qty 50

## 2015-01-24 MED ORDER — LIDOCAINE-EPINEPHRINE 1 %-1:100000 IJ SOLN
INTRAMUSCULAR | Status: DC | PRN
  Administered 2015-01-24: 5 mL

## 2015-01-24 MED ORDER — NEOSTIGMINE METHYLSULFATE 10 MG/10ML IV SOLN
INTRAVENOUS | Status: DC | PRN
Start: 2015-01-24 — End: 2015-01-24
  Administered 2015-01-24: 3 mg via INTRAVENOUS

## 2015-01-24 MED ORDER — FENTANYL CITRATE (PF) 100 MCG/2ML IJ SOLN
INTRAMUSCULAR | Status: DC | PRN
  Administered 2015-01-24 (×2): 50 ug via INTRAVENOUS
  Administered 2015-01-24: 100 ug via INTRAVENOUS
  Administered 2015-01-24 (×2): 50 ug via INTRAVENOUS

## 2015-01-24 MED ORDER — PANTOPRAZOLE SODIUM 40 MG PO TBEC
40.0000 mg | DELAYED_RELEASE_TABLET | Freq: Two times a day (BID) | ORAL | Status: DC
  Administered 2015-01-24 – 2015-01-25 (×2): 40 mg via ORAL
  Filled 2015-01-24 (×2): qty 1

## 2015-01-24 MED ORDER — LIDOCAINE HCL (CARDIAC) 20 MG/ML IV SOLN: INTRAVENOUS | Status: DC | PRN

## 2015-01-24 MED ORDER — BACITRACIN 50000 UNITS IM SOLR
INTRAMUSCULAR | Status: DC | PRN
  Administered 2015-01-24: 500 mL

## 2015-01-24 MED ORDER — SENNOSIDES-DOCUSATE SODIUM 8.6-50 MG PO TABS: 1.0000 | ORAL_TABLET | Freq: Every evening | ORAL | Status: DC | PRN

## 2015-01-24 MED ORDER — OXYCODONE-ACETAMINOPHEN 5-325 MG PO TABS
1.0000 | ORAL_TABLET | ORAL | Status: DC | PRN
  Administered 2015-01-24: 1 via ORAL
  Administered 2015-01-24 – 2015-01-25 (×3): 2 via ORAL
  Filled 2015-01-24 (×3): qty 2

## 2015-01-24 MED ORDER — MENTHOL 3 MG MT LOZG: 1.0000 | LOZENGE | OROMUCOSAL | Status: DC | PRN

## 2015-01-24 MED ORDER — ROCURONIUM BROMIDE 100 MG/10ML IV SOLN
INTRAVENOUS | Status: DC | PRN
  Administered 2015-01-24: 50 mg via INTRAVENOUS
  Administered 2015-01-24: 10 mg via INTRAVENOUS

## 2015-01-24 MED ORDER — FENTANYL CITRATE (PF) 100 MCG/2ML IJ SOLN
INTRAMUSCULAR | Status: AC
  Filled 2015-01-24: qty 2

## 2015-01-24 MED ORDER — EPHEDRINE SULFATE 50 MG/ML IJ SOLN
INTRAMUSCULAR | Status: DC | PRN
  Administered 2015-01-24 (×2): 5 mg via INTRAVENOUS

## 2015-01-24 MED ORDER — LISINOPRIL 5 MG PO TABS
5.0000 mg | ORAL_TABLET | Freq: Every day | ORAL | Status: DC
  Administered 2015-01-25: 5 mg via ORAL
  Filled 2015-01-24 (×2): qty 1

## 2015-01-24 MED ORDER — 0.9 % SODIUM CHLORIDE (POUR BTL) OPTIME
TOPICAL | Status: DC | PRN
  Administered 2015-01-24: 1000 mL

## 2015-01-24 MED ORDER — INSULIN ASPART 100 UNIT/ML ~~LOC~~ SOLN
0.0000 [IU] | Freq: Three times a day (TID) | SUBCUTANEOUS | Status: DC
  Administered 2015-01-25: 4 [IU] via SUBCUTANEOUS

## 2015-01-24 MED ORDER — METHOCARBAMOL 1000 MG/10ML IJ SOLN
500.0000 mg | Freq: Four times a day (QID) | INTRAVENOUS | Status: DC | PRN
  Filled 2015-01-24: qty 5

## 2015-01-24 MED ORDER — DOCUSATE SODIUM 100 MG PO CAPS
100.0000 mg | ORAL_CAPSULE | Freq: Two times a day (BID) | ORAL | Status: DC
  Administered 2015-01-24 – 2015-01-25 (×2): 100 mg via ORAL
  Filled 2015-01-24 (×2): qty 1

## 2015-01-24 MED ORDER — METFORMIN HCL 500 MG PO TABS
500.0000 mg | ORAL_TABLET | Freq: Two times a day (BID) | ORAL | Status: DC
  Administered 2015-01-25: 500 mg via ORAL
  Filled 2015-01-24: qty 1

## 2015-01-24 MED ORDER — MIDAZOLAM HCL 5 MG/5ML IJ SOLN
INTRAMUSCULAR | Status: DC | PRN
  Administered 2015-01-24: 2 mg via INTRAVENOUS

## 2015-01-24 MED ORDER — THROMBIN 20000 UNITS EX SOLR
CUTANEOUS | Status: DC | PRN
  Administered 2015-01-24: 20 mL via TOPICAL

## 2015-01-24 MED ORDER — SENNA 8.6 MG PO TABS
1.0000 | ORAL_TABLET | Freq: Two times a day (BID) | ORAL | Status: DC
  Administered 2015-01-24 – 2015-01-25 (×2): 8.6 mg via ORAL
  Filled 2015-01-24 (×2): qty 1

## 2015-01-24 MED ORDER — HEPARIN SODIUM (PORCINE) 5000 UNIT/ML IJ SOLN
5000.0000 [IU] | Freq: Three times a day (TID) | INTRAMUSCULAR | Status: DC
  Administered 2015-01-25: 5000 [IU] via SUBCUTANEOUS
  Filled 2015-01-24: qty 1

## 2015-01-24 MED ORDER — LACTATED RINGERS IV SOLN
INTRAVENOUS | Status: DC
  Administered 2015-01-24 (×2): via INTRAVENOUS

## 2015-01-24 MED ORDER — DILTIAZEM HCL ER COATED BEADS 180 MG PO CP24
180.0000 mg | ORAL_CAPSULE | Freq: Every day | ORAL | Status: DC
  Administered 2015-01-25: 180 mg via ORAL
  Filled 2015-01-24: qty 1

## 2015-01-24 MED ORDER — THROMBIN 5000 UNITS EX SOLR
OROMUCOSAL | Status: DC | PRN
  Administered 2015-01-24 (×2): 5 mL via TOPICAL

## 2015-01-24 MED ORDER — METHOCARBAMOL 500 MG PO TABS
ORAL_TABLET | ORAL | Status: AC
  Filled 2015-01-24: qty 1

## 2015-01-24 MED ORDER — MIDAZOLAM HCL 2 MG/2ML IJ SOLN
INTRAMUSCULAR | Status: AC
  Filled 2015-01-24: qty 2

## 2015-01-24 MED ORDER — ONDANSETRON HCL 4 MG/2ML IJ SOLN: 4.0000 mg | Freq: Once | INTRAMUSCULAR | Status: DC | PRN

## 2015-01-24 MED ORDER — ACETAMINOPHEN 650 MG RE SUPP: 650.0000 mg | RECTAL | Status: DC | PRN

## 2015-01-24 MED ORDER — SODIUM CHLORIDE 0.9 % IV SOLN: INTRAVENOUS | Status: DC

## 2015-01-24 MED ORDER — SULFASALAZINE 500 MG PO TABS
1000.0000 mg | ORAL_TABLET | Freq: Two times a day (BID) | ORAL | Status: DC
  Administered 2015-01-24 – 2015-01-25 (×2): 1000 mg via ORAL
  Filled 2015-01-24 (×3): qty 2

## 2015-01-24 MED ORDER — SODIUM CHLORIDE 0.9 % IV SOLN: 250.0000 mL | INTRAVENOUS | Status: DC

## 2015-01-24 MED ORDER — PROPOFOL 10 MG/ML IV BOLUS
INTRAVENOUS | Status: DC | PRN
  Administered 2015-01-24: 130 mg via INTRAVENOUS

## 2015-01-24 MED ORDER — MORPHINE SULFATE (PF) 2 MG/ML IV SOLN
1.0000 mg | INTRAVENOUS | Status: DC | PRN
  Administered 2015-01-24: 2 mg via INTRAVENOUS
  Administered 2015-01-24: 4 mg via INTRAVENOUS
  Filled 2015-01-24 (×2): qty 2

## 2015-01-24 MED ORDER — LIDOCAINE HCL (CARDIAC) 20 MG/ML IV SOLN
INTRAVENOUS | Status: DC | PRN
  Administered 2015-01-24: 100 mg via INTRAVENOUS

## 2015-01-24 MED ORDER — ZOLPIDEM TARTRATE 5 MG PO TABS: 5.0000 mg | ORAL_TABLET | Freq: Every evening | ORAL | Status: DC | PRN

## 2015-01-24 MED ORDER — PHENYLEPHRINE HCL 10 MG/ML IJ SOLN
INTRAMUSCULAR | Status: DC | PRN
  Administered 2015-01-24 (×2): 80 ug via INTRAVENOUS
  Administered 2015-01-24: 160 ug via INTRAVENOUS
  Administered 2015-01-24 (×3): 80 ug via INTRAVENOUS
  Administered 2015-01-24: 120 ug via INTRAVENOUS
  Administered 2015-01-24: 80 ug via INTRAVENOUS
  Administered 2015-01-24: 160 ug via INTRAVENOUS
  Administered 2015-01-24 (×2): 80 ug via INTRAVENOUS
  Administered 2015-01-24: 120 ug via INTRAVENOUS
  Administered 2015-01-24 (×5): 80 ug via INTRAVENOUS

## 2015-01-24 MED ORDER — INSULIN ASPART 100 UNIT/ML ~~LOC~~ SOLN
0.0000 [IU] | Freq: Every day | SUBCUTANEOUS | Status: DC
Start: 2015-01-24 — End: 2015-01-25
  Administered 2015-01-24: 2 [IU] via SUBCUTANEOUS

## 2015-01-24 MED ORDER — PHENOL 1.4 % MT LIQD
1.0000 | OROMUCOSAL | Status: DC | PRN
Start: 2015-01-24 — End: 2015-01-25

## 2015-01-24 SURGICAL SUPPLY — 73 items
BAG DECANTER FOR FLEXI CONT (MISCELLANEOUS) ×3 IMPLANT
BENZOIN TINCTURE PRP APPL 2/3 (GAUZE/BANDAGES/DRESSINGS) IMPLANT
BIT DRILL 3.5 POWEREASE (BIT) ×2 IMPLANT
BIT DRILL 3.5MM POWEREASE (BIT) ×1
BLADE CLIPPER SURG (BLADE) IMPLANT
BLADE SURG 11 STRL SS (BLADE) ×3 IMPLANT
BUR MATCHSTICK NEURO 3.0 LAGG (BURR) ×3 IMPLANT
BUR PRECISION FLUTE 5.0 (BURR) ×3 IMPLANT
CANISTER SUCT 3000ML PPV (MISCELLANEOUS) ×3 IMPLANT
CLOSURE WOUND 1/2 X4 (GAUZE/BANDAGES/DRESSINGS)
CONT SPEC 4OZ CLIKSEAL STRL BL (MISCELLANEOUS) ×3 IMPLANT
COVER BACK TABLE 60X90IN (DRAPES) ×3 IMPLANT
DECANTER SPIKE VIAL GLASS SM (MISCELLANEOUS) ×3 IMPLANT
DERMABOND ADVANCED (GAUZE/BANDAGES/DRESSINGS) ×2
DERMABOND ADVANCED .7 DNX12 (GAUZE/BANDAGES/DRESSINGS) ×1 IMPLANT
DEVICE INTERBODY ELEVATE 9X23 (Cage) ×6 IMPLANT
DRAPE C-ARM 42X72 X-RAY (DRAPES) ×3 IMPLANT
DRAPE C-ARMOR (DRAPES) ×3 IMPLANT
DRAPE LAPAROTOMY 100X72X124 (DRAPES) ×3 IMPLANT
DRAPE POUCH INSTRU U-SHP 10X18 (DRAPES) ×3 IMPLANT
DRAPE PROXIMA HALF (DRAPES) ×3 IMPLANT
DRAPE SURG 17X23 STRL (DRAPES) IMPLANT
DRSG OPSITE POSTOP 4X6 (GAUZE/BANDAGES/DRESSINGS) ×3 IMPLANT
DURAPREP 26ML APPLICATOR (WOUND CARE) ×3 IMPLANT
ELECT REM PT RETURN 9FT ADLT (ELECTROSURGICAL) ×3
ELECTRODE REM PT RTRN 9FT ADLT (ELECTROSURGICAL) ×1 IMPLANT
GAUZE SPONGE 4X4 12PLY STRL (GAUZE/BANDAGES/DRESSINGS) IMPLANT
GAUZE SPONGE 4X4 16PLY XRAY LF (GAUZE/BANDAGES/DRESSINGS) IMPLANT
GLOVE BIOGEL PI IND STRL 7.0 (GLOVE) ×3 IMPLANT
GLOVE BIOGEL PI IND STRL 7.5 (GLOVE) ×3 IMPLANT
GLOVE BIOGEL PI INDICATOR 7.0 (GLOVE) ×6
GLOVE BIOGEL PI INDICATOR 7.5 (GLOVE) ×6
GLOVE ECLIPSE 7.0 STRL STRAW (GLOVE) ×6 IMPLANT
GLOVE EXAM NITRILE LRG STRL (GLOVE) IMPLANT
GLOVE EXAM NITRILE MD LF STRL (GLOVE) IMPLANT
GLOVE EXAM NITRILE XL STR (GLOVE) IMPLANT
GLOVE EXAM NITRILE XS STR PU (GLOVE) IMPLANT
GLOVE SURG SS PI 6.5 STRL IVOR (GLOVE) ×9 IMPLANT
GLOVE SURG SS PI 7.0 STRL IVOR (GLOVE) ×12 IMPLANT
GOWN STRL REUS W/ TWL LRG LVL3 (GOWN DISPOSABLE) ×3 IMPLANT
GOWN STRL REUS W/ TWL XL LVL3 (GOWN DISPOSABLE) ×1 IMPLANT
GOWN STRL REUS W/TWL 2XL LVL3 (GOWN DISPOSABLE) IMPLANT
GOWN STRL REUS W/TWL LRG LVL3 (GOWN DISPOSABLE) ×6
GOWN STRL REUS W/TWL XL LVL3 (GOWN DISPOSABLE) ×2
HEMOSTAT POWDER KIT SURGIFOAM (HEMOSTASIS) ×6 IMPLANT
KIT BASIN OR (CUSTOM PROCEDURE TRAY) ×3 IMPLANT
KIT INFUSE XX SMALL 0.7CC (Orthopedic Implant) ×3 IMPLANT
KIT ROOM TURNOVER OR (KITS) ×3 IMPLANT
MILL MEDIUM DISP (BLADE) ×3 IMPLANT
NEEDLE HYPO 18GX1.5 BLUNT FILL (NEEDLE) IMPLANT
NEEDLE HYPO 25X1 1.5 SAFETY (NEEDLE) ×3 IMPLANT
NEEDLE SPNL 18GX3.5 QUINCKE PK (NEEDLE) ×3 IMPLANT
NS IRRIG 1000ML POUR BTL (IV SOLUTION) ×3 IMPLANT
PACK LAMINECTOMY NEURO (CUSTOM PROCEDURE TRAY) ×3 IMPLANT
PAD ARMBOARD 7.5X6 YLW CONV (MISCELLANEOUS) ×9 IMPLANT
ROD COBALT 47.5X35 (Rod) ×6 IMPLANT
SCREW 5.5X35MM (Screw) ×8 IMPLANT
SCREW BN 35X5.5XMA NS SPNE (Screw) ×4 IMPLANT
SCREW SET SOLERA (Screw) ×8 IMPLANT
SCREW SET SOLERA TI (Screw) ×4 IMPLANT
SPONGE LAP 4X18 X RAY DECT (DISPOSABLE) IMPLANT
SPONGE SURGIFOAM ABS GEL 100 (HEMOSTASIS) ×3 IMPLANT
STRIP BIOACTIVE VITOSS 25X52X4 (Orthopedic Implant) ×3 IMPLANT
STRIP CLOSURE SKIN 1/2X4 (GAUZE/BANDAGES/DRESSINGS) IMPLANT
SUT VIC AB 0 CT1 18XCR BRD8 (SUTURE) ×2 IMPLANT
SUT VIC AB 0 CT1 8-18 (SUTURE) ×4
SUT VICRYL 3-0 RB1 18 ABS (SUTURE) ×6 IMPLANT
SYR 3ML LL SCALE MARK (SYRINGE) IMPLANT
TOWEL OR 17X24 6PK STRL BLUE (TOWEL DISPOSABLE) ×3 IMPLANT
TOWEL OR 17X26 10 PK STRL BLUE (TOWEL DISPOSABLE) ×3 IMPLANT
TRAP SPECIMEN MUCOUS 40CC (MISCELLANEOUS) ×3 IMPLANT
TRAY FOLEY W/METER SILVER 14FR (SET/KITS/TRAYS/PACK) ×3 IMPLANT
WATER STERILE IRR 1000ML POUR (IV SOLUTION) ×3 IMPLANT

## 2015-01-24 NOTE — Anesthesia Preprocedure Evaluation (Addendum)
Anesthesia Evaluation  Patient identified by MRN, date of birth, ID band Patient awake    Reviewed: Allergy & Precautions, NPO status , Patient's Chart, lab work & pertinent test results  History of Anesthesia Complications Negative for: history of anesthetic complications  Airway Mallampati: III  TM Distance: >3 FB Neck ROM: Full    Dental no notable dental hx. (+) Dental Advisory Given, Poor Dentition   Pulmonary asthma , sleep apnea , Current Smoker,    Pulmonary exam normal breath sounds clear to auscultation       Cardiovascular hypertension, Pt. on medications Normal cardiovascular exam Rhythm:Regular Rate:Normal     Neuro/Psych PSYCHIATRIC DISORDERS Bipolar Disorder negative neurological ROS  negative psych ROS   GI/Hepatic Neg liver ROS, GERD  Medicated and Controlled,  Endo/Other  diabetesobesity  Renal/GU negative Renal ROS  negative genitourinary   Musculoskeletal  (+) Arthritis ,   Abdominal   Peds negative pediatric ROS (+)  Hematology negative hematology ROS (+)   Anesthesia Other Findings   Reproductive/Obstetrics negative OB ROS                           Anesthesia Physical Anesthesia Plan  ASA: II  Anesthesia Plan: General   Post-op Pain Management:    Induction: Intravenous  Airway Management Planned: Oral ETT  Additional Equipment:   Intra-op Plan:   Post-operative Plan: Extubation in OR  Informed Consent: I have reviewed the patients History and Physical, chart, labs and discussed the procedure including the risks, benefits and alternatives for the proposed anesthesia with the patient or authorized representative who has indicated his/her understanding and acceptance.   Dental advisory given  Plan Discussed with: CRNA  Anesthesia Plan Comments:         Anesthesia Quick Evaluation

## 2015-01-24 NOTE — Op Note (Signed)
PREOP DIAGNOSIS:  1. 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 Spondylolisthesis, L4-5  POSTOP DIAGNOSIS: Same  PROCEDURE: 1. L4 laminectomy with facetectomy for decompression of exiting nerve roots, more than would be required for placement of interbody graft 2. Placement of anterior interbody device - Medtronic expandable cage, 75mm x2 3. Posterior instrumentation using cortical pedicle screws at L4, L5 Medtronic Solera 5.5x65mm 4. Interbody arthrodesis, L4-5 5. Use of locally harvested bone autograft 6. Use of non-structural bone allograft - Vitoss BA 7. Use of BMP  SURGEON: Dr. Lisbeth Renshaw, MD  ASSISTANT: None  ANESTHESIA: General Endotracheal  EBL: 100cc  SPECIMENS: None  DRAINS: None  COMPLICATIONS: none immediate  CONDITION: Hemodynamically stable to PACU  HISTORY: Margaret [name] is a 64 y.o. female who has been followed in the outpatient clinic with back and leg pain related to stenosis and spondylolisthesis at L4-5. She attempted multiple conservative treatments and ultimately elected to proceed with surgical decompression and fusion. Risks and benefits were reviewed and consent was obtained.  PROCEDURE IN DETAIL: After informed consent was obtained and witnessed, the patient was brought to the operating room. After induction of general anesthesia, the patient was positioned on the operative table in the prone position. All pressure points were meticulously padded. Incision was then marked out and prepped and draped in the usual sterile fashion.  After timeout was conducted, skin was infiltrated with local anesthetic. Skin incision was then made sharply and Bovie electrocautery was used to dissect the subcutaneous tissue until the lumbodorsal fascia was identified and incised. The muscle was then elevated in the subperiosteal plane and the L4 lamina, inferior L3-4 and superior L4-5 facet complexes were identified. Self-retaining retractors were then placed.  Using intraoperative  fluoroscopy, entry points for bilateral L4cortical pedicle screws were identified. Pilot holes were then created under lateral fluoroscopy and tapped to 5.5 mm.  At this point attention was turned to decompression. Complete L4 laminectomy with facetectomy was completed using a combination of Kerrison rongeurs and a high-speed drill. I did not a significant amount of spondylotic tissue adherent to the dura on the left in the axilla of the L4 nerve root which was completely removed. Good decompression of the exiting and traversing roots was confirmed.  Disc space was then identified, incised, and using a combination of shavers, curettes and rongeurs, complete discectomy was completed. Endplates were prepared, and a 71mm expnadable cage was tapped into place and expanded to 49mm augmented with placement of bone harvested during decompression mixed with Vitoss. BMP was also placed in the interspace. Good position was confirmed with fluoroscopy.  At this point, the L5 cortical pedicle screws were drilled and tapped to 5.5 mm. Screws were then placed in L4 and L5, 5.63mm x 8mm. 82mm rod was then placed, set screws placed and final tightened. Final AP and lateral fluoroscopic images confirmed good position.  The wound was then irrigated with copious amounts of antibiotic saline, then closed in standard fashion using a combination of interrupted 0 and 3-0 Vicryl stitches in the muscular, fascial, and subcutaneous layers. Skin was then closed using standard Dermabond. Sterile dressing was then applied. The patient was then transferred to the stretcher, extubated, and taken to the postanesthesia care unit in stable hemodynamic condition.  At the end of the case all sponge, needle, cottonoid, and instrument counts were correct.

## 2015-01-24 NOTE — Transfer of Care (Signed)
Immediate Anesthesia Transfer of Care Note  Patient: Margaret [name]  Procedure(s) Performed: Procedure(s): LUMBAR FOUR-FIVE POSTERIOR LUMBAR INTERBODY FUSION WITH INTERBODY PROSTHESIS;POSTERIOR LATERAL ARTHRODESIS AND POSTERIOR NONSEGMENTAL INSTRUMENTATION (N/A)  Patient Location: PACU  Anesthesia Type:General  Level of Consciousness: awake, alert , oriented and patient cooperative  Airway & Oxygen Therapy: Patient Spontanous Breathing and Patient connected to nasal cannula oxygen  Post-op Assessment: Report given to RN, Post -op Vital signs reviewed and stable, Patient moving all extremities, Patient moving all extremities X 4 and Patient able to stick tongue midline  Post vital signs: Reviewed and stable  Last Vitals:  Filed Vitals:   01/24/15 1019  BP: 129/74  Pulse: 94  Temp: 36.3 C  Resp: 18    Complications: No apparent anesthesia complications

## 2015-01-24 NOTE — Anesthesia Postprocedure Evaluation (Signed)
Anesthesia Post Note  Patient: Margaret [name]  Procedure(s) Performed: Procedure(s) (LRB): LUMBAR FOUR-FIVE POSTERIOR LUMBAR INTERBODY FUSION WITH INTERBODY PROSTHESIS;POSTERIOR LATERAL ARTHRODESIS AND POSTERIOR NONSEGMENTAL INSTRUMENTATION (N/A)  Patient location during evaluation: PACU Anesthesia Type: General Level of consciousness: awake Pain management: pain level controlled Respiratory status: spontaneous breathing Cardiovascular status: stable    Last Vitals:  Filed Vitals:   01/24/15 1715 01/24/15 1730  BP: 112/67 114/65  Pulse: 84 76  Temp:    Resp: 16 14    Last Pain:  Filed Vitals:   01/24/15 1735  PainSc: Asleep                 EDWARDS,Aylan Bayona

## 2015-01-24 NOTE — H&P (Signed)
CC:  Back and leg pain  HPI: Margaret [name] is a 64 year old woman I'm seeing for back and leg pain. She was last seen a few months ago at which time she had been having fairly progressive back and leg pain consistent with neurogenic claudication. I referred her for a course of physical therapy and requested an MRI. She says that because her back and leg pain is so severe, she was not really able to even attempt physical therapy. In fact, she says that her husband has to assist her with walking of any length, even around their property. She is not really able to take NSAIDs because they cause problems with her stomach, and she has been taking tramadol, but this is not providing any significant relief of her pain.   PMH: Past Medical History  Diagnosis Date  . Diabetes mellitus without complication (HCC)   . Arthritis   . Hyperlipidemia   . Peripheral vascular disease (HCC)   . Varicose veins   . Diabetic neuropathy (HCC)   . Benign essential hypertension   . Hypersomnia   . Gastroesophageal reflux disease   . IBS (irritable bowel syndrome)   . Eczema   . Myalgia   . Asthma   . Dysrhythmia     takes diltiazem prescribed by medical doctor; has never cardiologist  . Noncompliance with CPAP treatment   . Sleep apnea 2015    home sleep study   . Rheumatoid arthritis (HCC)   . Bipolar disorder (HCC)     hospitalized at [facility] for manic episodes    PSH: Past Surgical History  Procedure Laterality Date  . Cesarean section    . Elbow surgery    . Endovenous ablation saphenous vein w/ laser      SH: Social History  Substance Use Topics  . Smoking status: Current Every Day Smoker -- 1.00 packs/day for 40 years    Types: Cigarettes  . Smokeless tobacco: Never Used  . Alcohol Use: No    MEDS: Prior to Admission medications   Medication Sig Start Date End Date Taking? Authorizing Provider  ALPRAZolam Prudy Feeler) 0.5 MG tablet Take 0.5 mg by mouth 2 (two) times daily as needed for  anxiety.    Yes Historical Provider, MD  aspirin 81 MG tablet Take 81 mg by mouth daily.   Yes Historical Provider, MD  atomoxetine (STRATTERA) 60 MG capsule Take 60 mg by mouth daily.    Yes Historical Provider, MD  buPROPion (WELLBUTRIN XL) 300 MG 24 hr tablet Take 300 mg by mouth daily.    Yes Historical Provider, MD  diltiazem (TAZTIA XT) 180 MG 24 hr capsule Take 180 mg by mouth daily.    Yes Historical Provider, MD  glimepiride (AMARYL) 4 MG tablet Take 4 mg by mouth daily with breakfast.    Yes Historical Provider, MD  Liraglutide (VICTOZA) 18 MG/3ML SOPN Inject 1.8 mg into the skin daily.    Yes Historical Provider, MD  lisinopril (PRINIVIL,ZESTRIL) 5 MG tablet Take 5 mg by mouth daily.    Yes Historical Provider, MD  metFORMIN (GLUCOPHAGE) 500 MG tablet Take 500 mg by mouth 2 (two) times daily with a meal.  11/04/14  Yes Historical Provider, MD  pantoprazole (PROTONIX) 40 MG tablet Take 40 mg by mouth 2 (two) times daily.    Yes Historical Provider, MD  sulfaSALAzine (AZULFIDINE) 500 MG tablet Take 1,000 mg by mouth 2 (two) times daily.    Yes Historical Provider, MD  topiramate (TOPAMAX) 200 MG  tablet Take 200 mg by mouth 2 (two) times daily.    Yes Historical Provider, MD    ALLERGY: Allergies  Allergen Reactions  . Hydroxychloroquine Rash  . Vicodin [Hydrocodone-Acetaminophen] Nausea Only    ROS: ROS  NEUROLOGIC EXAM: Awake, alert, oriented Memory and concentration grossly intact Speech fluent, appropriate CN grossly intact Motor exam: Upper Extremities Deltoid Bicep Tricep Grip  Right 5/5 5/5 5/5 5/5  Left 5/5 5/5 5/5 5/5   Lower Extremity IP Quad PF DF EHL  Right 5/5 5/5 5/5 5/5 5/5  Left 5/5 5/5 5/5 5/5 5/5   Sensation grossly intact to LT  [facility]: MRI of the lumbar spine dated 11-16 was reviewed. This demonstrates primary pathology at L4 L5, where there is grade 1 anterolisthesis. There is also severe multifactorial stenosis from broad-based disc bulge, facet  arthropathy, and ligamentous hypertrophy. There is mild to moderate central and lateral recess stenosis at L3 L4 primarily due to broad-based disc bulge.  IMPRESSION: 64 year old woman with back and leg pain likely related to severe stenosis and spondylolisthesis at L4 L5. She is unable to tolerate any significant activity, including physical therapy due to the pain. She is not getting any relief from oral medication.  PLAN: Proceed with L4-5 decompression/fusion  I did review the MRI findings with the patient and her husband. Options at this point were discussed including surgical decompression and fusion at L4 L5. Risks and benefits of the surgery were discussed, including nerve root injury leading to leg or foot weakness and or bowel and bladder dysfunction, CSF leak, bleeding, and infection. Possible outcomes of surgery were also discussed including the possibility of uncomplicated surgery but persistence of pain symptoms and the possiblity of accelerated adjacent level degeneration. The general risks of anesthesia were also reviewed including heart attack, stroke, and DVT/PE.   The patient under husband appear to understand our discussion, and agreed to proceed with surgery.  All questions were answered

## 2015-01-24 NOTE — Anesthesia Procedure Notes (Signed)
Procedure Name: Intubation Date/Time: 01/24/2015 1:37 PM Performed by: Adonis Housekeeper Pre-anesthesia Checklist: Patient identified, Emergency Drugs available, Suction available and Patient being monitored Patient Re-evaluated:Patient Re-evaluated prior to inductionOxygen Delivery Method: Circle system utilized Preoxygenation: Pre-oxygenation with 100% oxygen Intubation Type: IV induction Ventilation: Mask ventilation without difficulty, Oral airway inserted - appropriate to patient size and Two handed mask ventilation required Laryngoscope Size: Mac and 3 Grade View: Grade II Tube type: Oral Tube size: 7.0 mm Number of attempts: 1 Airway Equipment and Method: Stylet Placement Confirmation: ETT inserted through vocal cords under direct vision,  positive ETCO2 and breath sounds checked- equal and bilateral Secured at: 22 cm Tube secured with: Tape Dental Injury: Teeth and Oropharynx as per pre-operative assessment

## 2015-01-25 LAB — CBC
HEMATOCRIT: 36.6 % (ref 36.0–46.0)
HEMOGLOBIN: 12.6 g/dL (ref 12.0–15.0)
MCH: 33.4 pg (ref 26.0–34.0)
MCHC: 34.4 g/dL (ref 30.0–36.0)
MCV: 97.1 fL (ref 78.0–100.0)
Platelets: 197 10*3/uL (ref 150–400)
RBC: 3.77 MIL/uL — AB (ref 3.87–5.11)
RDW: 12.9 % (ref 11.5–15.5)
WBC: 13.2 10*3/uL — ABNORMAL HIGH (ref 4.0–10.5)

## 2015-01-25 LAB — BASIC METABOLIC PANEL
Anion gap: 9 (ref 5–15)
BUN: 17 mg/dL (ref 6–20)
CHLORIDE: 112 mmol/L — AB (ref 101–111)
CO2: 20 mmol/L — AB (ref 22–32)
CREATININE: 0.9 mg/dL (ref 0.44–1.00)
Calcium: 8.6 mg/dL — ABNORMAL LOW (ref 8.9–10.3)
GFR calc non Af Amer: >60 mL/min (ref >60)
Glucose, Bld: 185 mg/dL — ABNORMAL HIGH (ref 65–99)
POTASSIUM: 4.1 mmol/L (ref 3.5–5.1)
SODIUM: 141 mmol/L (ref 135–145)

## 2015-01-25 LAB — GLUCOSE, CAPILLARY: GLUCOSE-CAPILLARY: 184 mg/dL — AB (ref 65–99)

## 2015-01-25 MED ORDER — METHOCARBAMOL 500 MG PO TABS: 500.0000 mg | ORAL_TABLET | Freq: Four times a day (QID) | ORAL | Status: DC | PRN

## 2015-01-25 MED ORDER — OXYCODONE-ACETAMINOPHEN 5-325 MG PO TABS: 1.0000 | ORAL_TABLET | ORAL | Status: DC | PRN

## 2015-01-25 MED ORDER — ASPIRIN 81 MG PO TABS: 81.0000 mg | ORAL_TABLET | Freq: Every day | ORAL | Status: DC

## 2015-01-25 NOTE — Evaluation (Signed)
Occupational Therapy Evaluation Patient Details Name: Margaret [name] MRN: [id number] DOB: 06/02/1950 Today's Date: 01/25/2015    History of Present Illness 64 yo female s/p L4-5 fusion PMH: DM arthritis, PVD, neuropathy, IBS, asthma, bipolar   Clinical Impression   Patient evaluated by Occupational Therapy with no further acute OT needs identified. All education has been completed and the patient has no further questions. See below for any follow-up Occupational Therapy or equipment needs. OT to sign off. Thank you for referral.      Follow Up Recommendations  No OT follow up    Equipment Recommendations  None recommended by OT    Recommendations for Other Services       Precautions / Restrictions Precautions Precautions: Back Precaution Comments: handout provided and reviewed in detail      Mobility Bed Mobility Overal bed mobility: Needs Assistance Bed Mobility: Supine to Sit;Sit to Supine     Supine to sit: Min assist Sit to supine: Min assist   General bed mobility comments: (A) for bil LE into bed surface but able to complete proper sequence  Transfers Overall transfer level: Needs assistance   Transfers: Sit to/from Stand Sit to Stand: Supervision              Balance                                            ADL Overall ADL's : Needs assistance/impaired Eating/Feeding: Modified independent   Grooming: Wash/dry face;Oral care;Supervision/safety       Lower Body Bathing: Moderate assistance;Sit to/from stand       Lower Body Dressing: Moderate assistance;Sit to/from stand Lower Body Dressing Details (indicate cue type and reason): husband (A) at baseline and declined AE education. Pt does have reacher at home. Pt states "i am just not good with that stuff" Toilet Transfer: Min guard     Toileting - Clothing Manipulation Details (indicate cue type and reason): educated on avoiding bendign and twisting Tub/ Shower  Transfer: Supervision/safety;Walk-in Copywriter, advertising Details (indicate cue type and reason): spouse present and educated patient on use of warm water  Functional mobility during ADLs: Supervision/safety General ADL Comments: Pt and spouse provided hand out and reviewed in detail. educated on night time medication management, care for dogs at home x2 and proper sitting positioning     Vision     Perception     Praxis      Pertinent Vitals/Pain Pain Assessment: 0-10 Pain Score: 2  Pain Location: back pain at surgerical site Pain Descriptors / Indicators: Discomfort Pain Intervention(s): Repositioned;Monitored during session     Hand Dominance Right   Extremity/Trunk Assessment Upper Extremity Assessment Upper Extremity Assessment: Overall WFL for tasks assessed   Lower Extremity Assessment Lower Extremity Assessment: Defer to PT evaluation   Cervical / Trunk Assessment Cervical / Trunk Assessment: Other exceptions (s/p surg)   Communication Communication Communication: No difficulties   Cognition Arousal/Alertness: Awake/alert Behavior During Therapy: WFL for tasks assessed/performed Overall Cognitive Status: Within Functional Limits for tasks assessed                     General Comments       Exercises       Shoulder Instructions      Home Living Family/patient expects to be discharged to:: Private residence Living Arrangements: Spouse/significant other Available Help at  Discharge: Family;Available 24 hours/day Type of Home: House Home Access: Stairs to enter [employer] of Steps: 1 Entrance Stairs-Rails: Right Home Layout: Multi-level;Bed/bath upstairs Alternate Level Stairs-Number of Steps: 7 with rail Alternate Level Stairs-Rails: Can reach both Bathroom Shower/Tub: Producer, television/film/video: Standard     Home Equipment: None          Prior Functioning/Environment Level of Independence: Needs assistance     ADL's / Homemaking Assistance Needed: husband helps with dressing / bathing        OT Diagnosis: Generalized weakness;Acute pain   OT Problem List:     OT Treatment/Interventions:      OT Goals(Current goals can be found in the care plan section) Acute Rehab OT Goals OT Goal Formulation: With patient/family Potential to Achieve Goals: Good  OT Frequency:     Barriers to D/C:            Co-evaluation              End of Session Nurse Communication: Mobility status;Precautions  Activity Tolerance: Patient tolerated treatment well Patient left: in bed;with call bell/phone within reach;with family/visitor present   Time: 1610-9604 OT Time Calculation (min): 26 min Charges:  OT General Charges $OT Visit: 1 Procedure OT Evaluation $Initial OT Evaluation Tier I: 1 Procedure OT Treatments $Self Care/Home Management : 8-22 mins G-Codes:    Harolyn Rutherford 02/18/2015, 9:12 AM   Mateo Flow   OTR/L Pager: [phone] Office: [phone] .

## 2015-01-25 NOTE — Discharge Instructions (Signed)
Wound Care Keep incision covered and dry for two days.   Do not put any creams, lotions, or ointments on incision. Leave incision open to air. Activity Walk each and every day, increasing distance each day. No lifting greater than 5 lbs.  Avoid bending, lifting and twisting. No driving for 2 weeks; may ride as a passenger locally. If provided with back brace, wear when out of bed.  It is not necessary to wear brace in bed. Diet Resume your normal diet.  Return to Work Will be discussed at you follow up appointment. Call Your Doctor If Any of These Occur Redness, drainage, or swelling at the wound.  Temperature greater than 101 degrees. Severe pain not relieved by pain medication. Incision starts to come apart. Follow Up Appt Call today for appointment in 1-2 weeks ([phone]) or for problems.  If you have any hardware placed in your spine, you will need an x-ray before your appointment.

## 2015-01-25 NOTE — Progress Notes (Signed)
Orthopedic Tech Progress Note Patient Details:  Margaret [name] 1950-02-23 [id number]  Patient ID: Margaret [name], female   DOB: [birth year], 64 y.o.   MRN: [id number] Called in bio-tech brace order; spoke with Anderson Malta, Frantz Quattrone 01/25/2015, 10:09 AM

## 2015-01-25 NOTE — Evaluation (Signed)
Physical Therapy Evaluation and Discharge Patient Details Name: Margaret [name] MRN: [id number] DOB: 01-Jan-1951 Today's Date: 01/25/2015   History of Present Illness  64 yo female s/p L4-5 fusion PMH: DM arthritis, PVD, neuropathy, IBS, asthma, bipolar  Clinical Impression  Pt admitted with above diagnosis. Pt currently with functional limitations due to the deficits listed below (see PT Problem List). At the time of PT eval pt was able to perform transfers and ambulation with supervision for safety. Pt will have husband available for support at home. Pt will benefit from skilled PT to increase their independence and safety with mobility to allow discharge to the venue listed below.       Follow Up Recommendations Outpatient PT    Equipment Recommendations  None recommended by PT    Recommendations for Other Services       Precautions / Restrictions Precautions Precautions: Back Precaution Comments: handout provided and reviewed in detail Restrictions Weight Bearing Restrictions: No      Mobility  Bed Mobility Overal bed mobility: Needs Assistance Bed Mobility: Supine to Sit;Sit to Supine     Supine to sit: Min assist Sit to supine: Min assist   General bed mobility comments: Pt received sitting up EOB  Transfers Overall transfer level: Needs assistance Equipment used: None Transfers: Sit to/from Stand Sit to Stand: Supervision         General transfer comment: Supervision for safety. Pt practiced x4 throughout session until pt able to achieve without assist.  Ambulation/Gait Ambulation/Gait assistance: Supervision Ambulation Distance (Feet): 400 Feet Assistive device: None Gait Pattern/deviations: Step-through pattern;Decreased stride length Gait velocity: Decreased Gait velocity interpretation: Below normal speed for age/gender General Gait Details: Somewhat slow but guarded. Pt states she is used to reaching out for support due to leg pain, and although her  legs no longer hurt, she is still wanting to reach out for furniture to hold to.   Stairs Stairs: Yes Stairs assistance: Min guard Stair Management: One rail Right;Forwards Number of Stairs: 7 General stair comments: Pt was cued for sequencing and technique. Was able to safely maneuver 7 stairs without physical assistance, however close guard was provided for safety.   Wheelchair Mobility    Modified Rankin (Stroke Patients Only)       Balance Overall balance assessment: Needs assistance Sitting-balance support: Feet supported;No upper extremity supported Sitting balance-Leahy Scale: Fair     Standing balance support: No upper extremity supported;During functional activity Standing balance-Leahy Scale: Fair                               Pertinent Vitals/Pain Pain Assessment: 0-10 Pain Score: 2  Pain Location: Incision site Pain Descriptors / Indicators: Operative site guarding;Discomfort Pain Intervention(s): Limited activity within patient's tolerance;Monitored during session;Repositioned    Home Living Family/patient expects to be discharged to:: Private residence Living Arrangements: Spouse/significant other Available Help at Discharge: Family;Available 24 hours/day Type of Home: House Home Access: Stairs to enter Entrance Stairs-Rails: Right Entrance Stairs-Number of Steps: 1 Home Layout: Multi-level;Bed/bath upstairs Home Equipment: None      Prior Function Level of Independence: Needs assistance      ADL's / Homemaking Assistance Needed: husband helps with dressing / bathing        Hand Dominance   Dominant Hand: Right    Extremity/Trunk Assessment   Upper Extremity Assessment: Defer to OT evaluation           Lower Extremity Assessment: Generalized  weakness      Cervical / Trunk Assessment: Other exceptions (s/p surg)  Communication   Communication: No difficulties  Cognition Arousal/Alertness: Awake/alert Behavior During  Therapy: WFL for tasks assessed/performed Overall Cognitive Status: Within Functional Limits for tasks assessed                      General Comments General comments (skin integrity, edema, etc.): dressing intact and dry    Exercises        Assessment/Plan    PT Assessment Patent does not need any further PT services  PT Diagnosis Difficulty walking;Acute pain   PT Problem List    PT Treatment Interventions     PT Goals (Current goals can be found in the Care Plan section) Acute Rehab PT Goals PT Goal Formulation: All assessment and education complete, DC therapy    Frequency     Barriers to discharge        Co-evaluation               End of Session   Activity Tolerance: Patient tolerated treatment well Patient left: in chair;with call bell/phone within reach;with family/visitor present Nurse Communication: Mobility status         Time: 1607-3710 PT Time Calculation (min) (ACUTE ONLY): 21 min   Charges:   PT Evaluation $Initial PT Evaluation Tier I: 1 Procedure     PT G CodesConni Slipper 2015/02/24, 9:50 AM   Conni Slipper, PT, DPT Acute Rehabilitation Services Pager: [phone]

## 2015-01-25 NOTE — Progress Notes (Signed)
Pt. discharged home accompanied by husband. Prescriptions and discharge instructions given with verbalization of understanding. Incision site on back with no s/s of infection - no swelling, redness, bleeding, and/or drainage noted. Lumbar brace in place. No c/o pain at the time of discharged. Pt stated understanding of instructions given.  Marland Kitchen

## 2015-01-25 NOTE — Discharge Summary (Signed)
Physician Discharge Summary  Patient ID: Margaret [name] MRN: [id number] DOB/AGE: 09-28-1950 64 y.o.  Admit date: 01/24/2015 Discharge date: 01/25/2015  Admission Diagnoses: Lumbar spondylolisthesis, L4-5  Discharge Diagnoses: Same Active Problems:   Spondylolisthesis at L4-L5 level   Discharged Condition: Stable  Hospital Course:  Mrs. ROENA SASSAMAN is a 64 y.o. female who presented to the clinic with back and leg pain and MRI demonstrating stenosis with spondylolisthesis at L4-5. The patient was admitted for elective L4-5 PLIF which was done without complication. On POD#1 the patient was at her neurologic baseline, reporting relief of her  leg pain. Back pain was controlled with oral medication, she was ambulating without difficulty, voiding normally, and tolerating diet.  Treatments: Surgery - L4-5 PLIF  Discharge Exam: Blood pressure 114/65, pulse 90, temperature 98.4 F (36.9 C), temperature source Oral, resp. rate 18, height 5\' 2"  (1.575 m), weight 88.905 kg (196 lb), SpO2 100 %. Awake, alert, oriented Speech fluent, appropriate CN grossly intact 5/5 BUE/BLE Wound c/d/i Wound c/d/i  Disposition: Home     Medication List    TAKE these medications        ALPRAZolam 0.5 MG tablet  Commonly known as:  XANAX  Take 0.5 mg by mouth 2 (two) times daily as needed for anxiety.     aspirin 81 MG tablet  Take 1 tablet (81 mg total) by mouth daily.  Start taking on:  01/31/2015     atomoxetine 60 MG capsule  Commonly known as:  STRATTERA  Take 60 mg by mouth daily.     buPROPion 300 MG 24 hr tablet  Commonly known as:  WELLBUTRIN XL  Take 300 mg by mouth daily.     glimepiride 4 MG tablet  Commonly known as:  AMARYL  Take 4 mg by mouth daily with breakfast.     lisinopril 5 MG tablet  Commonly known as:  PRINIVIL,ZESTRIL  Take 5 mg by mouth daily.     metFORMIN 500 MG tablet  Commonly known as:  GLUCOPHAGE  Take 500 mg by mouth 2 (two) times daily with a  meal.     methocarbamol 500 MG tablet  Commonly known as:  ROBAXIN  Take 1 tablet (500 mg total) by mouth every 6 (six) hours as needed for muscle spasms.     oxyCODONE-acetaminophen 5-325 MG tablet  Commonly known as:  PERCOCET/ROXICET  Take 1-2 tablets by mouth every 4 (four) hours as needed for moderate pain.     pantoprazole 40 MG tablet  Commonly known as:  PROTONIX  Take 40 mg by mouth 2 (two) times daily.     sulfaSALAzine 500 MG tablet  Commonly known as:  AZULFIDINE  Take 1,000 mg by mouth 2 (two) times daily.     TAZTIA XT 180 MG 24 hr capsule  Generic drug:  diltiazem  Take 180 mg by mouth daily.     topiramate 200 MG tablet  Commonly known as:  TOPAMAX  Take 200 mg by mouth 2 (two) times daily.     VICTOZA 18 MG/3ML Sopn  Generic drug:  Liraglutide  Inject 1.8 mg into the skin daily.           Follow-up Information    Follow up with [facility], Anthonny Schiller, C, MD In 2 weeks.   Specialty:  Neurosurgery   Contact information:   [address] [place] [phone]       Signed: [phone] 01/25/2015, 9:54 AM

## 2015-01-27 NOTE — Progress Notes (Signed)
Utilization review completed.  

## 2015-05-18 LAB — CBC AND DIFFERENTIAL
HCT: 42 % (ref 36–46)
Hemoglobin: 13.7 g/dL (ref 12.0–16.0)
Platelets: 265 10*3/uL (ref 150–399)
WBC: 10.3 10^3/mL

## 2015-05-18 LAB — HEPATIC FUNCTION PANEL
ALT: 12 U/L (ref 7–35)
AST: 12 U/L — AB (ref 13–35)
Alkaline Phosphatase: 78 U/L (ref 25–125)
BILIRUBIN, TOTAL: 0.3 mg/dL

## 2015-05-18 LAB — BASIC METABOLIC PANEL
BUN: 25 mg/dL — AB (ref 4–21)
Creatinine: 1 mg/dL (ref 0.5–1.1)
GLUCOSE: 153 mg/dL
POTASSIUM: 4.5 mmol/L (ref 3.4–5.3)
SODIUM: 140 mmol/L (ref 137–147)

## 2015-05-31 ENCOUNTER — Encounter: Payer: Self-pay | Admitting: Internal Medicine

## 2015-06-24 ENCOUNTER — Ambulatory Visit (INDEPENDENT_AMBULATORY_CARE_PROVIDER_SITE_OTHER): Payer: Medicare Other | Admitting: Gastroenterology

## 2015-06-24 ENCOUNTER — Encounter: Payer: Self-pay | Admitting: Gastroenterology

## 2015-06-24 VITALS — BP 131/5 | HR 96 | Temp 97.3°F | Ht 62.5 in | Wt 198.0 lb

## 2015-06-24 DIAGNOSIS — K5900 Constipation, unspecified: Secondary | ICD-10-CM | POA: Diagnosis not present

## 2015-06-24 DIAGNOSIS — K219 Gastro-esophageal reflux disease without esophagitis: Secondary | ICD-10-CM

## 2015-06-24 NOTE — Progress Notes (Signed)
Primary Care Physician:  Kirstie Peri, MD Primary Gastroenterologist:  Dr. Jena Gauss   Chief Complaint  Patient presents with  . Gastroesophageal Reflux    HPI:   Margaret [name] is a 65 y.o. female presenting today at the request of Dr. Sherryll Burger secondary to GERD. Feels like her esophagus flutters/spasms while eating. Present for about 2-3 months. Notes a remote history of this with eating. Would go walking and then it would stop. Chronic history of reflux. Has changed PPIs in past with improvement. Taking Protonix BID. Added Zantac, which made it "stop completely" for weeks but will still have intermittent instances. No dysphagia. Sometimes feels like gas that won't come out. Will take something to get rid of gas, but it won't go away until "it gets ready to go away". No N/V. No unexplained weight loss or lack of appetite. Has taken Nexium in the past. Never tried Dexilant.   Last colonoscopy in 2011 by Dr. Loreta Ave in [place]. Stays constipated "all the time". Stool softeners and probiotics but still staying constipated. BM will sometimes be only every 3 days. Eats a lot of fiber.   Past Medical History  Diagnosis Date  . Diabetes mellitus without complication (HCC)   . Arthritis   . Hyperlipidemia   . Peripheral vascular disease (HCC)   . Varicose veins   . Diabetic neuropathy (HCC)   . Benign essential hypertension   . Hypersomnia   . Gastroesophageal reflux disease   . IBS (irritable bowel syndrome)   . Eczema   . Myalgia   . Asthma   . Dysrhythmia     takes diltiazem prescribed by medical doctor; has never seen cardiologist  . Noncompliance with CPAP treatment   . Sleep apnea 2015    home sleep study   . Rheumatoid arthritis (HCC)   . Bipolar disorder [facility])     hospitalized at [facility] for manic episodes    Past Surgical History  Procedure Laterality Date  . Cesarean section    . Elbow surgery    . Endovenous ablation saphenous vein w/ laser    . Colonoscopy  2011   Dr. Loreta Ave in [place]  . Back surgery      Current Outpatient Prescriptions  Medication Sig Dispense Refill  . ALPRAZolam (XANAX) 0.5 MG tablet Take 0.5 mg by mouth 2 (two) times daily as needed for anxiety.     Marland Kitchen atomoxetine (STRATTERA) 60 MG capsule Take 60 mg by mouth daily.     Marland Kitchen buPROPion (WELLBUTRIN XL) 300 MG 24 hr tablet Take 300 mg by mouth daily.     Marland Kitchen diltiazem (TAZTIA XT) 180 MG 24 hr capsule Take 180 mg by mouth daily.     Marland Kitchen glimepiride (AMARYL) 4 MG tablet Take 4 mg by mouth daily with breakfast.     . Liraglutide (VICTOZA) 18 MG/3ML SOPN Inject 1.8 mg into the skin daily.     Marland Kitchen lisinopril (PRINIVIL,ZESTRIL) 5 MG tablet Take 5 mg by mouth daily.     . metFORMIN (GLUCOPHAGE) 500 MG tablet Take 500 mg by mouth 2 (two) times daily with a meal.     . pantoprazole (PROTONIX) 40 MG tablet Take 40 mg by mouth 2 (two) times daily.     Marland Kitchen sulfaSALAzine (AZULFIDINE) 500 MG tablet Take 1,000 mg by mouth 2 (two) times daily.     Marland Kitchen topiramate (TOPAMAX) 200 MG tablet Take 200 mg by mouth 2 (two) times daily.     . traMADol (ULTRAM) 50 MG  tablet Take 50 mg by mouth every 6 (six) hours as needed.     No current facility-administered medications for this visit.    Allergies as of 06/24/2015 - Review Complete 06/24/2015  Allergen Reaction Noted  . Hydroxychloroquine Rash 10/26/2014  . Vicodin [hydrocodone-acetaminophen] Nausea Only 10/26/2014    Family History  Problem Relation Age of Onset  . Hypertension Father   . AAA (abdominal aortic aneurysm) Brother   . Colon cancer Neg Hx   . Colon polyps Neg Hx     Social History   Social History  . Marital Status: Married    Spouse Name: N/A  . Number of Children: N/A  . Years of Education: N/A   Occupational History  . Not on file.   Social History Main Topics  . Smoking status: Current Every Day Smoker -- 1.00 packs/day for 40 years    Types: Cigarettes  . Smokeless tobacco: Never Used  . Alcohol Use: No  . Drug Use: No  .  Sexual Activity: Not on file   Other Topics Concern  . Not on file   Social History Narrative    Review of Systems: Gen: Denies any fever, chills, fatigue, weight loss, lack of appetite.  CV: Denies chest pain, heart palpitations, peripheral edema, syncope.  Resp: Denies shortness of breath at rest or with exertion. Denies wheezing or cough.  GI: see HPI  GU : Denies urinary burning, urinary frequency, urinary hesitancy MS: +joint pain  Derm: Denies rash, itching, dry skin Psych: Denies depression, anxiety, memory loss, and confusion Heme: Denies bruising, bleeding, and enlarged lymph nodes.  Physical Exam: BP 131/5 mmHg  Pulse 96  Temp(Src) 97.3 F (36.3 C) (Oral)  Ht 5' 2.5" (1.588 m)  Wt 198 lb (89.812 kg)  BMI 35.62 kg/m2 General:   Alert and oriented. Pleasant and cooperative. Well-nourished and well-developed.  Head:  Normocephalic and atraumatic. Eyes:  Without icterus, sclera clear and conjunctiva pink.  Ears:  Normal auditory acuity. Nose:  No deformity, discharge,  or lesions. Mouth:  No deformity or lesions, oral mucosa pink.  Lungs:  Clear to auscultation bilaterally. No wheezes, rales, or rhonchi. No distress.  Heart:  S1, S2 present without murmurs appreciated.  Abdomen:  +BS, soft, non-tender and non-distended. No HSM noted. No guarding or rebound. No masses appreciated.  Rectal:  Deferred  Msk:  Symmetrical without gross deformities. Normal posture. Extremities:  Without edema. Neurologic:  Alert and  oriented x4;  grossly normal neurologically. Psych:  Alert and cooperative. Normal mood and affect.

## 2015-06-24 NOTE — Assessment & Plan Note (Signed)
65 year old female with chronic GERD, noting vague symptoms of "flutters/spasms" in chest while eating, with some improvement noted with PPI therapy in the past. No alarm symptoms. No prior EGD. Will trial Dexilant once daily, with patient to call in 10 days with an update. If no improvement, consider EGD with Propofol (polypharmacy). May need BPE. Risks and benefits discussed at time of visit if EGD needed. 3 month return.

## 2015-06-24 NOTE — Patient Instructions (Signed)
For reflux: stop Protonix. We have given you samples of Dexilant. Take this once each day. Call in about 10 days with an update. If this is better, we will hold on the upper endoscopy. If not, we will need to proceed with that.   For constipation: you may stop the stool softeners. Start the samples of Linzess, one capsule each morning on an empty stomach before breakfast.   Call me with an update regarding medications and if you would like a prescription.  We will see you in 3 months regardless.    Have a great weekend!

## 2015-06-24 NOTE — Assessment & Plan Note (Signed)
No improvement with OTC agents. Start Linzess 290 mcg once daily. Obtain colonoscopy reports (Dr. Loreta Ave 2011). Return in 3 months.

## 2015-06-27 NOTE — Progress Notes (Signed)
CC'D TO PCP °

## 2015-06-30 ENCOUNTER — Other Ambulatory Visit: Payer: Self-pay

## 2015-06-30 DIAGNOSIS — R928 Other abnormal and inconclusive findings on diagnostic imaging of breast: Secondary | ICD-10-CM

## 2015-07-04 ENCOUNTER — Encounter: Payer: Self-pay | Admitting: Gastroenterology

## 2015-07-05 ENCOUNTER — Other Ambulatory Visit: Payer: Self-pay | Admitting: Gastroenterology

## 2015-07-05 MED ORDER — LINACLOTIDE 290 MCG PO CAPS: 290.0000 ug | ORAL_CAPSULE | Freq: Every day | ORAL | Status: DC

## 2015-07-05 MED ORDER — DEXLANSOPRAZOLE 60 MG PO CPDR: 60.0000 mg | DELAYED_RELEASE_CAPSULE | Freq: Every day | ORAL | Status: DC

## 2015-07-12 ENCOUNTER — Ambulatory Visit
Admission: RE | Admit: 2015-07-12 | Discharge: 2015-07-12 | Disposition: A | Discharge status: Home / Self Care | Payer: Medicare Other | Source: Ambulatory Visit

## 2015-07-12 DIAGNOSIS — R928 Other abnormal and inconclusive findings on diagnostic imaging of breast: Secondary | ICD-10-CM

## 2015-07-22 ENCOUNTER — Telehealth: Payer: Self-pay | Admitting: Internal Medicine

## 2015-07-22 NOTE — Telephone Encounter (Signed)
PLEASE CALL PATIENT REGARDING DEXILANT AND HER BLOOD SUGER

## 2015-07-25 NOTE — Telephone Encounter (Signed)
Spoke with the pt, she said she started on dexilant and linzess in May after seeing AS. The medications are working well but now her blood sugar is elevated. Blood sugar one day last week was 349. Her last A1C was 7.8 when the previous one was 6.1. She said she has not changed her diet, the only thing different is she started on dexilant and linzess. She read on the Internet that dexilant can block some medications from working. She wants to know what she should do?

## 2015-07-26 NOTE — Telephone Encounter (Signed)
No change in GI medication. See Dr. Treating diabetes for further device

## 2015-08-08 NOTE — Telephone Encounter (Signed)
Pt is aware of plan. I have called the Aciphex to the pharmacy. She does take the Linzess daily. She will call within 3 weeks and let us know how she is doing.

## 2015-08-08 NOTE — Telephone Encounter (Signed)
I called and informed pt. She was very upset that it has taken awhile for a return call. She was expressing her frustration and I asked if she would like to speak to my Print production planner. Durward Mallard took the call.

## 2015-08-08 NOTE — Telephone Encounter (Signed)
Patient called in very concerned that she needed to see RMR in regards to her blood sugar.  She states her pcp has increased her Metformin and now blood sugars are under control.    She stopped her Dexilant about two weeks ago to help control her blood sugars, however she would like to try another medication for her reflux that will not cause an increase in her blood sugar.  Patient became frustrated and ask me to speak with her husband-who is a EMT, regarding her issues with Dexilant.   After speaking with him for several minutes, he then placed his wife back on the phone and she thanked me for calling and apologized for her getting upset.   Please advise on another medication she can take for her reflux  She can be reached at [phone].

## 2015-08-08 NOTE — Telephone Encounter (Signed)
Routing to Doris to call the patient 

## 2015-08-08 NOTE — Telephone Encounter (Signed)
Communication noted. I discussed with AS. I doubt blood sugars and Dexilant are related.  Let's try a course of rabeprazole 20 mg twice a day 1 month (dispense 60) with 11 refills  Need to review the multi pronged approach to reflux disease treatment. Patient needs to get literature on non-pharmacologic therapy of GERD as well.  Please make sure she is taking Linzess daily as well for constipation.  If GERD symptoms haven't settled down significantly in 3 weeks from now, we'll bypass the office and set her up directly for an EGD with propofol.  If rabeprazole is effective, I see her back in the office in about 2 months.

## 2015-09-13 ENCOUNTER — Ambulatory Visit (HOSPITAL_COMMUNITY)
Admission: RE | Admit: 2015-09-13 | Discharge: 2015-09-13 | Disposition: A | Discharge status: Home / Self Care | Payer: Medicare Other | Source: Ambulatory Visit | Attending: Internal Medicine | Admitting: Internal Medicine

## 2015-09-13 ENCOUNTER — Other Ambulatory Visit (HOSPITAL_COMMUNITY): Payer: Self-pay | Admitting: Internal Medicine

## 2015-09-13 DIAGNOSIS — R0602 Shortness of breath: Secondary | ICD-10-CM

## 2015-09-26 ENCOUNTER — Ambulatory Visit: Payer: Medicare Other | Admitting: Gastroenterology

## 2015-10-03 ENCOUNTER — Ambulatory Visit: Payer: Medicare Other | Admitting: Internal Medicine

## 2016-01-05 ENCOUNTER — Encounter: Payer: Self-pay | Admitting: *Deleted

## 2016-01-05 DIAGNOSIS — M5136 Other intervertebral disc degeneration, lumbar region: Secondary | ICD-10-CM

## 2016-01-05 DIAGNOSIS — N903 Dysplasia of vulva, unspecified: Secondary | ICD-10-CM

## 2016-01-05 DIAGNOSIS — F988 Other specified behavioral and emotional disorders with onset usually occurring in childhood and adolescence: Secondary | ICD-10-CM

## 2016-01-05 DIAGNOSIS — F319 Bipolar disorder, unspecified: Secondary | ICD-10-CM | POA: Insufficient documentation

## 2016-01-05 DIAGNOSIS — M19042 Primary osteoarthritis, left hand: Secondary | ICD-10-CM

## 2016-01-05 DIAGNOSIS — R600 Localized edema: Secondary | ICD-10-CM

## 2016-01-05 DIAGNOSIS — M19041 Primary osteoarthritis, right hand: Secondary | ICD-10-CM

## 2016-01-05 DIAGNOSIS — M51369 Other intervertebral disc degeneration, lumbar region without mention of lumbar back pain or lower extremity pain: Secondary | ICD-10-CM

## 2016-01-05 DIAGNOSIS — M19072 Primary osteoarthritis, left ankle and foot: Secondary | ICD-10-CM

## 2016-01-05 DIAGNOSIS — M19071 Primary osteoarthritis, right ankle and foot: Secondary | ICD-10-CM

## 2016-01-05 DIAGNOSIS — G4730 Sleep apnea, unspecified: Secondary | ICD-10-CM | POA: Insufficient documentation

## 2016-01-05 DIAGNOSIS — M7730 Calcaneal spur, unspecified foot: Secondary | ICD-10-CM

## 2016-01-05 DIAGNOSIS — S0300XA Dislocation of jaw, unspecified side, initial encounter: Secondary | ICD-10-CM

## 2016-01-05 DIAGNOSIS — M069 Rheumatoid arthritis, unspecified: Secondary | ICD-10-CM

## 2016-01-05 DIAGNOSIS — M170 Bilateral primary osteoarthritis of knee: Secondary | ICD-10-CM

## 2016-01-05 HISTORY — DX: Bilateral primary osteoarthritis of knee: M17.0

## 2016-01-05 HISTORY — DX: Primary osteoarthritis, left hand: M19.041

## 2016-01-05 HISTORY — DX: Calcaneal spur, unspecified foot: M77.30

## 2016-01-05 HISTORY — DX: Primary osteoarthritis, right ankle and foot: M19.071

## 2016-01-05 HISTORY — DX: Localized edema: R60.0

## 2016-01-05 HISTORY — DX: Other specified behavioral and emotional disorders with onset usually occurring in childhood and adolescence: F98.8

## 2016-01-05 HISTORY — DX: Dislocation of jaw, unspecified side, initial encounter: S03.00XA

## 2016-01-05 HISTORY — DX: Rheumatoid arthritis, unspecified: M06.9

## 2016-01-05 HISTORY — DX: Other intervertebral disc degeneration, lumbar region: M51.36

## 2016-01-05 HISTORY — DX: Other intervertebral disc degeneration, lumbar region without mention of lumbar back pain or lower extremity pain: M51.369

## 2016-01-06 DIAGNOSIS — Z79899 Other long term (current) drug therapy: Secondary | ICD-10-CM | POA: Insufficient documentation

## 2016-01-06 NOTE — Progress Notes (Signed)
Office Visit Note  Patient: Margaret [name]             Date of Birth: [birth year]           MRN: [id number]             PCP: Kirstie Peri, MD Referring: Kirstie Peri, MD Visit Date: 01/09/2016 Occupation: House maker    Subjective:  Bilateral knee joint pain   History of Present Illness: Margaret [name] is a 65 y.o. female with history of sero positive rheumatoid arthritis. She continues to have some pain in her knee joints. She had Visco supplement injections to her knee joints in April 2017 and did well for a while. Recently her  right knee joint has been hurting. She denies any joint swelling. She complains of some occasional lower back pain. She had discectomy on her lower back in December 2016.  Activities of Daily Living:  Patient reports morning stiffness for 10 minutes.   Patient Denies nocturnal pain.  Difficulty dressing/grooming: Denies Difficulty climbing stairs: Denies Difficulty getting out of chair: Denies Difficulty using hands for taps, buttons, cutlery, and/or writing: Denies   Review of Systems  Constitutional: Positive for weight loss. Negative for fatigue, night sweats, weight gain and weakness.       Intentional weight loss  HENT: Positive for mouth dryness. Negative for mouth sores, trouble swallowing, trouble swallowing and nose dryness.        Medication related  Eyes: Positive for dryness. Negative for pain, redness and visual disturbance.  Respiratory: Negative for cough, shortness of breath and difficulty breathing.   Cardiovascular: Negative for chest pain, palpitations, hypertension, irregular heartbeat and swelling in legs/feet.  Gastrointestinal: Negative for blood in stool, constipation and diarrhea.  Endocrine: Negative for increased urination.  Genitourinary: Negative for vaginal dryness.  Musculoskeletal: Positive for arthralgias, joint pain and morning stiffness. Negative for joint swelling, myalgias, muscle weakness, muscle tenderness and  myalgias.  Skin: Negative for color change, rash, hair loss, skin tightness, ulcers and sensitivity to sunlight.  Allergic/Immunologic: Negative for susceptible to infections.  Neurological: Negative for dizziness, memory loss and night sweats.  Hematological: Negative for swollen glands.  Psychiatric/Behavioral: Negative for depressed mood and sleep disturbance. The patient is not nervous/anxious.   All other systems reviewed and are negative.   PMFS History:  Patient Active Problem List   Diagnosis Date Noted  . Smoker 01/09/2016  . High risk medication use 01/06/2016  . RA (rheumatoid arthritis) (HCC) 01/05/2016  . DDD (degenerative disc disease), lumbar 01/05/2016  . Calcaneal spur 01/05/2016  . Bipolar disorder (HCC) 01/05/2016  . ADD (attention deficit disorder) 01/05/2016  . Sleep apnea 01/05/2016  . TMJ (dislocation of temporomandibular joint) 01/05/2016  . Pedal edema 01/05/2016  . Osteoarthritis of both hands 01/05/2016  . Osteoarthritis of both feet 01/05/2016  . Osteoarthritis of both knees 01/05/2016  . GERD (gastroesophageal reflux disease) 06/24/2015  . Constipation 06/24/2015  . Spondylolisthesis at L4-L5 level 01/24/2015  . 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 CONTRACTURE OF SHOULDER JOINT 04/24/2007  . DIABETES 07/30/2006    Past Medical History:  Diagnosis Date  . ADD (attention deficit disorder) 01/05/2016  . Arthritis   . Asthma   . Benign essential hypertension   . Bipolar disorder [facility])    hospitalized at [facility] for manic episodes  . Calcaneal spur 01/05/2016  . DDD (degenerative disc disease), lumbar 01/05/2016  . Diabetes mellitus without complication (HCC)   . Diabetic neuropathy (HCC)   . Dysrhythmia  takes diltiazem prescribed by medical doctor; has never seen cardiologist  . Eczema   . Gastroesophageal reflux disease   . Hyperlipidemia   . Hypersomnia   . IBS (irritable bowel syndrome)   . Myalgia   . Noncompliance with CPAP treatment     . Osteoarthritis of both feet 01/05/2016  . Osteoarthritis of both hands 01/05/2016  . Osteoarthritis of both knees 01/05/2016  . Pedal edema 01/05/2016  . Peripheral vascular disease (HCC)   . RA (rheumatoid arthritis) (HCC) 01/05/2016   +RF, +CCP, Nodulous,   . Rheumatoid arthritis (HCC)   . Sleep apnea 2015   home sleep study   . TMJ (dislocation of temporomandibular joint) 01/05/2016  . Varicose veins     Family History  Problem Relation Age of Onset  . Hypertension Father   . AAA (abdominal aortic aneurysm) Brother   . Colon cancer Neg Hx   . Colon polyps Neg Hx    Past Surgical History:  Procedure Laterality Date  . BACK SURGERY    . CESAREAN SECTION    . COLONOSCOPY  2011   Dr. Loreta Ave in [place]  . ELBOW SURGERY    . ENDOVENOUS ABLATION SAPHENOUS VEIN W/ LASER     Social History   Social History Narrative  . No narrative on file     Objective: Vital Signs: BP 127/63 (BP Location: Left Arm, Patient Position: Sitting, Cuff Size: Large)   Pulse 97   Resp 14   Ht 5\' 3"  (1.6 m)   Wt 187 lb (84.8 kg)   BMI 33.13 kg/m    Physical Exam  Constitutional: She is oriented to person, place, and time. She appears well-developed and well-nourished.  HENT:  Head: Normocephalic and atraumatic.  Eyes: Conjunctivae and EOM are normal.  Neck: Normal range of motion.  Cardiovascular: Normal rate, regular rhythm, normal heart sounds and intact distal pulses.   Pulmonary/Chest: Effort normal and breath sounds normal.  Abdominal: Soft. Bowel sounds are normal.  Liver and spleen were difficult to palpate due to body habitus  Lymphadenopathy:    She has no cervical adenopathy.  Neurological: She is alert and oriented to person, place, and time.  Skin: Skin is warm and dry. Capillary refill takes less than 2 seconds.  Psychiatric: She has a normal mood and affect. Her behavior is normal.  Nursing note and vitals reviewed.    Musculoskeletal Exam: C-spine good range of  motion, thoracic spine good range of motion she has limited painful range of motion of her lumbar spine. Shoulder joints, elbow joints, wrist joints with good range of motion MCPs with good range of motion no synovitis she has thickening of PIP/DIP joints in her hands consistent with osteoarthritis. Hip joints, knee joints, ankle joints, MTPs PIPs with good range of motion she has thickening of PIP/DIP joints consistent with osteoarthritis.  CDAI Exam: CDAI Homunculus Exam:   Tenderness:  RLE: tibiofemoral  Joint Counts:  CDAI Tender Joint count: 1 CDAI Swollen Joint count: 0  Global Assessments:  Patient Global Assessment: 4 Provider Global Assessment: 4  CDAI Calculated Score: 9   Investigation: Findings:  Rheumatoid arthritis.  Positive rheumatoid factor and CCP CBC CMP 05/18/2015 normal  08/18/2015 RAPID-3 shows a high severity, consistent with 4.3 index.   03/21/2015 X-rays of bilateral knee joints  shows moderate medial compartment narrowing, mild patellofemoral joint space narrowing, no CPPD.   X-rays of bilateral hands 2 views shows moderate DIP and PIP joint space narrowing, moderate radiocarpal joint space  narrowing, no erosions and no changes versus 2014.   X-rays of bilateral feet 2 views versus May 2014 shows bilateral DIP and PIP joint space narrowing, no MTP joint space narrowing, no erosions.  No changes versus May 2014.      On sulfasalazine EN 2 pills b.i.d.  Imaging: No results found.  Speciality Comments: No specialty comments available.    Procedures:  No procedures performed Allergies: Fluoxetine hcl (pmdd); Hydroxychloroquine; and Vicodin [hydrocodone-acetaminophen]   Assessment / Plan:     Visit Diagnoses: Rheumatoid arthritis with rheumatoid factor of multiple sites without organ or systems involvement (HCC) -   Positive rheumatoid factor and CCP: She currently does not have any synovitis although she continues to have some  arthralgias.  Primary osteoarthritis of both hands: Joint pain continues. Muscle strengthening and joint protection was discussed.  Primary osteoarthritis of both knees: She did really well with Visco supplement injections her right knee joint has been causing some discomfort now. I've advised her to contact us before she wants to had her next Visco supplement injections.  Primary osteoarthritis of both feet: Proper proper fitting shoes were discussed.  High risk medication use : She is on sulfasalazine which she is tolerating well her labs are past-due we will check her labs today and then every 3 months to monitor for drug toxicity.  Chronic smoker: Smoking cessation was discussed at length.  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 She continues to have some lower back pain.  Diabetes, reflux, bipolar disorder, ADD, sleep apnea.   Orders: Orders Placed This Encounter  Procedures  . COMPLETE METABOLIC PANEL WITH GFR  . COMPLETE METABOLIC PANEL WITH GFR  . CBC with Differential/Platelet  . COMPLETE METABOLIC PANEL WITH GFR   No orders of the defined types were placed in this encounter.   Face-to-face time spent with patient was  . 50% of time was spent in counseling and coordination of care.  Follow-Up Instructions: Return in about 5 months (around 06/08/2016) for Rheumatoid arthritis.   Pollyann Savoy, MD

## 2016-01-09 ENCOUNTER — Ambulatory Visit (INDEPENDENT_AMBULATORY_CARE_PROVIDER_SITE_OTHER): Payer: Medicare Other | Admitting: Rheumatology

## 2016-01-09 ENCOUNTER — Encounter: Payer: Self-pay | Admitting: Rheumatology

## 2016-01-09 VITALS — BP 127/63 | HR 97 | Resp 14 | Ht 63.0 in | Wt 187.0 lb

## 2016-01-09 DIAGNOSIS — M170 Bilateral primary osteoarthritis of knee: Secondary | ICD-10-CM | POA: Diagnosis not present

## 2016-01-09 DIAGNOSIS — M19071 Primary osteoarthritis, right ankle and foot: Secondary | ICD-10-CM

## 2016-01-09 DIAGNOSIS — M0579 Rheumatoid arthritis with rheumatoid factor of multiple sites without organ or systems involvement: Secondary | ICD-10-CM | POA: Diagnosis not present

## 2016-01-09 DIAGNOSIS — M19072 Primary osteoarthritis, left ankle and foot: Secondary | ICD-10-CM

## 2016-01-09 DIAGNOSIS — M19042 Primary osteoarthritis, left hand: Secondary | ICD-10-CM | POA: Diagnosis not present

## 2016-01-09 DIAGNOSIS — F17200 Nicotine dependence, unspecified, uncomplicated: Secondary | ICD-10-CM | POA: Diagnosis not present

## 2016-01-09 DIAGNOSIS — Z79899 Other long term (current) drug therapy: Secondary | ICD-10-CM

## 2016-01-09 DIAGNOSIS — M19041 Primary osteoarthritis, right hand: Secondary | ICD-10-CM | POA: Diagnosis not present

## 2016-01-09 LAB — COMPLETE METABOLIC PANEL WITH GFR
ALBUMIN: 4.3 g/dL (ref 3.6–5.1)
ALT: 14 U/L (ref 6–29)
AST: 13 U/L (ref 10–35)
Alkaline Phosphatase: 56 U/L (ref 33–130)
BILIRUBIN TOTAL: 0.2 mg/dL (ref 0.2–1.2)
BUN: 25 mg/dL (ref 7–25)
CO2: 23 mmol/L (ref 20–31)
CREATININE: 1.11 mg/dL — AB (ref 0.50–0.99)
Calcium: 9.3 mg/dL (ref 8.6–10.4)
Chloride: 107 mmol/L (ref 98–110)
GFR, Est African American: 61 mL/min (ref >=60)
GFR, Est Non African American: 53 mL/min — ABNORMAL LOW (ref >=60)
GLUCOSE: 112 mg/dL — AB (ref 65–99)
Potassium: 4.2 mmol/L (ref 3.5–5.3)
SODIUM: 139 mmol/L (ref 135–146)
TOTAL PROTEIN: 6.3 g/dL (ref 6.1–8.1)

## 2016-01-09 LAB — CBC WITH DIFFERENTIAL/PLATELET
BASOS PCT: 0 %
Basophils Absolute: 0 cells/uL (ref 0–200)
EOS ABS: 270 {cells}/uL (ref 15–500)
Eosinophils Relative: 3 %
HCT: 41 % (ref 35.0–45.0)
HEMOGLOBIN: 13.4 g/dL (ref 11.7–15.5)
LYMPHS ABS: 2430 {cells}/uL (ref 850–3900)
Lymphocytes Relative: 27 %
MCH: 31.8 pg (ref 27.0–33.0)
MCHC: 32.7 g/dL (ref 32.0–36.0)
MCV: 97.4 fL (ref 80.0–100.0)
MONOS PCT: 8 %
MPV: 9.1 fL (ref 7.5–12.5)
Monocytes Absolute: 720 cells/uL (ref 200–950)
NEUTROS ABS: 5580 {cells}/uL (ref 1500–7800)
Neutrophils Relative %: 62 %
PLATELETS: 253 10*3/uL (ref 140–400)
RBC: 4.21 MIL/uL (ref 3.80–5.10)
RDW: 13.7 % (ref 11.0–15.0)
WBC: 9 10*3/uL (ref 3.8–10.8)

## 2016-01-09 NOTE — Patient Instructions (Signed)
Standing Labs We placed an order today for your standing lab work.    Please come back and get your standing labs in March 2018 and every 3 months.  We have open lab Monday through Friday from 8:30-11:30 AM and 1-4 PM at the office of Dr. Arbutus Ped, PA.   The office is located at [address], [address], [place], [place] [address] No appointment is necessary.   Labs are drawn by [employer].  You may receive a bill from [facility] for your lab work.

## 2016-01-09 NOTE — Progress Notes (Signed)
Rheumatology Medication Review by a Pharmacist Does the patient feel that his/her medications are working for him/her?  Yes Has the patient been experiencing any side effects to the medications prescribed?  No Does the patient have any problems obtaining medications?  No  Issues to address at subsequent visits: None   Pharmacist comments:  Margaret [name] is a pleasant 65 yo F who presents for follow up of her rheumatoid arthritis.  Patient is currently taking sulfasalazine 1000 mg twice daily.  Patient reports adherence with her medication.  Patient is past due for her standing labs at this time.  Will place standing lab orders.  Patient denied any medication related questions at this time.    Margaret [name], Pharm.D., BCPS Clinical Pharmacist Pager: [phone] Phone: [phone] 01/09/2016 8:27 AM

## 2016-01-10 NOTE — Progress Notes (Signed)
Reduce sulfasalazine to 3 tablets a day ,will recheck labs and schedule

## 2016-01-19 ENCOUNTER — Ambulatory Visit: Payer: Self-pay | Admitting: Rheumatology

## 2016-03-12 ENCOUNTER — Ambulatory Visit (INDEPENDENT_AMBULATORY_CARE_PROVIDER_SITE_OTHER): Payer: Medicare Other | Admitting: Otolaryngology

## 2016-03-12 DIAGNOSIS — H9313 Tinnitus, bilateral: Secondary | ICD-10-CM | POA: Diagnosis not present

## 2016-03-12 DIAGNOSIS — H9209 Otalgia, unspecified ear: Secondary | ICD-10-CM

## 2016-03-12 DIAGNOSIS — H903 Sensorineural hearing loss, bilateral: Secondary | ICD-10-CM | POA: Diagnosis not present

## 2016-03-12 DIAGNOSIS — H6123 Impacted cerumen, bilateral: Secondary | ICD-10-CM

## 2016-04-12 ENCOUNTER — Other Ambulatory Visit: Payer: Self-pay | Admitting: *Deleted

## 2016-04-12 DIAGNOSIS — Z79899 Other long term (current) drug therapy: Secondary | ICD-10-CM

## 2016-04-12 LAB — COMPLETE METABOLIC PANEL WITH GFR
ALBUMIN: 4.1 g/dL (ref 3.6–5.1)
ALK PHOS: 54 U/L (ref 33–130)
ALT: 14 U/L (ref 6–29)
AST: 12 U/L (ref 10–35)
BUN: 22 mg/dL (ref 7–25)
CALCIUM: 9.7 mg/dL (ref 8.6–10.4)
CHLORIDE: 105 mmol/L (ref 98–110)
CO2: 23 mmol/L (ref 20–31)
Creat: 1.04 mg/dL — ABNORMAL HIGH (ref 0.50–0.99)
GFR, EST AFRICAN AMERICAN: 66 mL/min (ref >=60)
GFR, EST NON AFRICAN AMERICAN: 57 mL/min — AB (ref >=60)
Glucose, Bld: 152 mg/dL — ABNORMAL HIGH (ref 65–99)
POTASSIUM: 4.5 mmol/L (ref 3.5–5.3)
Sodium: 140 mmol/L (ref 135–146)
Total Bilirubin: 0.3 mg/dL (ref 0.2–1.2)
Total Protein: 6.2 g/dL (ref 6.1–8.1)

## 2016-04-13 NOTE — Progress Notes (Signed)
stable °

## 2016-05-05 ENCOUNTER — Other Ambulatory Visit: Payer: Self-pay | Admitting: Rheumatology

## 2016-05-08 NOTE — Telephone Encounter (Signed)
Last Visit: 01/09/16 Next Visit: 06/07/16 Labs: 04/12/16 Stable  Okay to refill SSZ?

## 2016-05-08 NOTE — Telephone Encounter (Signed)
ok 

## 2016-06-07 ENCOUNTER — Encounter: Payer: Self-pay | Admitting: Rheumatology

## 2016-06-07 ENCOUNTER — Ambulatory Visit (INDEPENDENT_AMBULATORY_CARE_PROVIDER_SITE_OTHER): Payer: Medicare Other | Admitting: Rheumatology

## 2016-06-07 VITALS — BP 120/74 | HR 74 | Ht 62.5 in | Wt 184.0 lb

## 2016-06-07 DIAGNOSIS — M19041 Primary osteoarthritis, right hand: Secondary | ICD-10-CM

## 2016-06-07 DIAGNOSIS — M059 Rheumatoid arthritis with rheumatoid factor, unspecified: Secondary | ICD-10-CM

## 2016-06-07 DIAGNOSIS — M170 Bilateral primary osteoarthritis of knee: Secondary | ICD-10-CM | POA: Diagnosis not present

## 2016-06-07 DIAGNOSIS — Z79899 Other long term (current) drug therapy: Secondary | ICD-10-CM

## 2016-06-07 DIAGNOSIS — M19071 Primary osteoarthritis, right ankle and foot: Secondary | ICD-10-CM

## 2016-06-07 DIAGNOSIS — M19042 Primary osteoarthritis, left hand: Secondary | ICD-10-CM

## 2016-06-07 DIAGNOSIS — M19072 Primary osteoarthritis, left ankle and foot: Secondary | ICD-10-CM

## 2016-06-07 LAB — COMPLETE METABOLIC PANEL WITH GFR
ALK PHOS: 59 U/L (ref 33–130)
ALT: 13 U/L (ref 6–29)
AST: 12 U/L (ref 10–35)
Albumin: 4.2 g/dL (ref 3.6–5.1)
BILIRUBIN TOTAL: 0.3 mg/dL (ref 0.2–1.2)
BUN: 28 mg/dL — ABNORMAL HIGH (ref 7–25)
CO2: 18 mmol/L — AB (ref 20–31)
Calcium: 9.2 mg/dL (ref 8.6–10.4)
Chloride: 110 mmol/L (ref 98–110)
Creat: 1.04 mg/dL — ABNORMAL HIGH (ref 0.50–0.99)
GFR, EST AFRICAN AMERICAN: 66 mL/min (ref >=60)
GFR, EST NON AFRICAN AMERICAN: 57 mL/min — AB (ref >=60)
Glucose, Bld: 99 mg/dL (ref 65–99)
POTASSIUM: 4.3 mmol/L (ref 3.5–5.3)
Sodium: 139 mmol/L (ref 135–146)
TOTAL PROTEIN: 6.4 g/dL (ref 6.1–8.1)

## 2016-06-07 LAB — CBC WITH DIFFERENTIAL/PLATELET
BASOS ABS: 96 {cells}/uL (ref 0–200)
Basophils Relative: 1 %
EOS ABS: 384 {cells}/uL (ref 15–500)
EOS PCT: 4 %
HCT: 41.9 % (ref 35.0–45.0)
Hemoglobin: 13.7 g/dL (ref 11.7–15.5)
LYMPHS PCT: 27 %
Lymphs Abs: 2592 cells/uL (ref 850–3900)
MCH: 30.1 pg (ref 27.0–33.0)
MCHC: 32.7 g/dL (ref 32.0–36.0)
MCV: 92.1 fL (ref 80.0–100.0)
MONOS PCT: 8 %
MPV: 9.4 fL (ref 7.5–12.5)
Monocytes Absolute: 768 cells/uL (ref 200–950)
NEUTROS ABS: 5760 {cells}/uL (ref 1500–7800)
NEUTROS PCT: 60 %
PLATELETS: 285 10*3/uL (ref 140–400)
RBC: 4.55 MIL/uL (ref 3.80–5.10)
RDW: 14.8 % (ref 11.0–15.0)
WBC: 9.6 10*3/uL (ref 3.8–10.8)

## 2016-06-07 NOTE — Progress Notes (Signed)
Office Visit Note  Patient: Margaret [name]             Date of Birth: [birth year]           MRN: [id number]             PCP: Monico Blitz, MD Referring: Monico Blitz, MD Visit Date: 06/07/2016 Occupation: '@GUAROCC' @    Subjective:  Follow-up   History of Present Illness: Margaret [name] is a 66 y.o. female   Last seen 01/09/2016. Patient has a history of rheumatoid arthritis with positive rheumatoid factor and positive CCP. At the last visit on December, she was advised to decrease her sulfasalazine. She was advised to take sulfasalazine EN 1000 mg in the morning and 500 mg in the evening. She has been compliant and doing well with this new dose. She has had no flare.    Activities of Daily Living:  Patient reports morning stiffness for 15 minutes.   Patient Denies nocturnal pain.  Difficulty dressing/grooming: Denies Difficulty climbing stairs: Denies Difficulty getting out of chair: Denies Difficulty using hands for taps, buttons, cutlery, and/or writing: Denies   Review of Systems  Constitutional: Negative for fatigue.  HENT: Negative for mouth sores and mouth dryness.   Eyes: Negative for dryness.  Respiratory: Negative for shortness of breath.   Gastrointestinal: Negative for constipation and diarrhea.  Musculoskeletal: Negative for myalgias and myalgias.  Skin: Negative for sensitivity to sunlight.  Psychiatric/Behavioral: Negative for decreased concentration and sleep disturbance.    PMFS History:  Patient Active Problem List   Diagnosis Date Noted  . Smoker 01/09/2016  . High risk medication use 01/06/2016  . RA (rheumatoid arthritis) ([place]) 01/05/2016  . DDD (degenerative disc disease), lumbar 01/05/2016  . Calcaneal spur 01/05/2016  . Bipolar disorder ([place]) 01/05/2016  . ADD (attention deficit disorder) 01/05/2016  . Sleep apnea 01/05/2016  . TMJ (dislocation of temporomandibular joint) 01/05/2016  . Pedal edema 01/05/2016  . Osteoarthritis of both  hands 01/05/2016  . Osteoarthritis of both feet 01/05/2016  . Osteoarthritis of both knees 01/05/2016  . GERD (gastroesophageal reflux disease) 06/24/2015  . Constipation 06/24/2015  . Spondylolisthesis at L4-L5 level 01/24/2015  . 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 CONTRACTURE OF SHOULDER JOINT 04/24/2007  . DIABETES 07/30/2006    Past Medical History:  Diagnosis Date  . ADD (attention deficit disorder) 01/05/2016  . Arthritis   . Asthma   . Benign essential hypertension   . Bipolar disorder [facility])    hospitalized at [facility] for manic episodes  . Calcaneal spur 01/05/2016  . DDD (degenerative disc disease), lumbar 01/05/2016  . Diabetes mellitus without complication ([place])   . Diabetic neuropathy ([place])   . Dysrhythmia    takes diltiazem prescribed by medical doctor; has never seen cardiologist  . Eczema   . Gastroesophageal reflux disease   . Hyperlipidemia   . Hypersomnia   . IBS (irritable bowel syndrome)   . Myalgia   . Noncompliance with CPAP treatment   . Osteoarthritis of both feet 01/05/2016  . Osteoarthritis of both hands 01/05/2016  . Osteoarthritis of both knees 01/05/2016  . Pedal edema 01/05/2016  . Peripheral vascular disease ([place])   . RA (rheumatoid arthritis) ([place]) 01/05/2016   +RF, +CCP, Nodulous,   . Rheumatoid arthritis ([place])   . Sleep apnea 2015   home sleep study   . TMJ (dislocation of temporomandibular joint) 01/05/2016  . Varicose veins     Family History  Problem Relation Age of Onset  .  Hypertension Father   . AAA (abdominal aortic aneurysm) Brother   . Colon cancer Neg Hx   . Colon polyps Neg Hx    Past Surgical History:  Procedure Laterality Date  . BACK SURGERY    . CESAREAN SECTION    . COLONOSCOPY  2011   Dr. Collene Mares in [place]  . ELBOW SURGERY    . ENDOVENOUS ABLATION SAPHENOUS VEIN W/ LASER     Social History   Social History Narrative  . No narrative on file     Objective: Vital Signs: BP 120/74   Pulse 74   Ht 5' 2.5"  (1.588 m)   Wt 184 lb (83.5 kg)   BMI 33.12 kg/m    Physical Exam  Constitutional: She is oriented to person, place, and time. She appears well-developed and well-nourished.  HENT:  Head: Normocephalic and atraumatic.  Eyes: EOM are normal. Pupils are equal, round, and reactive to light.  Cardiovascular: Normal rate, regular rhythm and normal heart sounds.  Exam reveals no gallop and no friction rub.   No murmur heard. Pulmonary/Chest: Effort normal and breath sounds normal. She has no wheezes. She has no rales.  Abdominal: Soft. Bowel sounds are normal. She exhibits no distension. There is no tenderness. There is no guarding. No hernia.  Musculoskeletal: Normal range of motion. She exhibits no edema, tenderness or deformity.  Lymphadenopathy:    She has no cervical adenopathy.  Neurological: She is alert and oriented to person, place, and time. Coordination normal.  Skin: Skin is warm and dry. Capillary refill takes less than 2 seconds. No rash noted.  Psychiatric: She has a normal mood and affect. Her behavior is normal.  Nursing note and vitals reviewed.    Musculoskeletal Exam:  Full range of motion of all joints Grip strength is equal and strong bilaterally Fiber myalgia tender points are all absent  CDAI Exam: CDAI Homunculus Exam:   Joint Counts:  CDAI Tender Joint count: 0 CDAI Swollen Joint count: 0  Global Assessments:  Patient Global Assessment: 0 Provider Global Assessment: 0    Investigation: No additional findings. Orders Only on 04/12/2016  Component Date Value Ref Range Status  . Sodium 04/12/2016 140  135 - 146 mmol/L Final  . Potassium 04/12/2016 4.5  3.5 - 5.3 mmol/L Final  . Chloride 04/12/2016 105  98 - 110 mmol/L Final  . CO2 04/12/2016 23  20 - 31 mmol/L Final  . Glucose, Bld 04/12/2016 152* 65 - 99 mg/dL Final  . BUN 04/12/2016 22  7 - 25 mg/dL Final  . Creat 04/12/2016 1.04* 0.50 - 0.99 mg/dL Final   Comment:   For patients > or = 64 years  of age: The upper reference limit for Creatinine is approximately 13% higher for people identified as African-American.     . Total Bilirubin 04/12/2016 0.3  0.2 - 1.2 mg/dL Final  . Alkaline Phosphatase 04/12/2016 54  33 - 130 U/L Final  . AST 04/12/2016 12  10 - 35 U/L Final  . ALT 04/12/2016 14  6 - 29 U/L Final  . Total Protein 04/12/2016 6.2  6.1 - 8.1 g/dL Final  . Albumin 04/12/2016 4.1  3.6 - 5.1 g/dL Final  . Calcium 04/12/2016 9.7  8.6 - 10.4 mg/dL Final  . GFR, Est African American 04/12/2016 66  >=60 mL/min Final  . GFR, Est Non African American 04/12/2016 57* >=60 mL/min Final  Office Visit on 01/09/2016  Component Date Value Ref Range Status  . WBC  01/09/2016 9.0  3.8 - 10.8 K/uL Final  . RBC 01/09/2016 4.21  3.80 - 5.10 MIL/uL Final  . Hemoglobin 01/09/2016 13.4  11.7 - 15.5 g/dL Final  . HCT 01/09/2016 41.0  35.0 - 45.0 % Final  . MCV 01/09/2016 97.4  80.0 - 100.0 fL Final  . MCH 01/09/2016 31.8  27.0 - 33.0 pg Final  . MCHC 01/09/2016 32.7  32.0 - 36.0 g/dL Final  . RDW 01/09/2016 13.7  11.0 - 15.0 % Final  . Platelets 01/09/2016 253  140 - 400 K/uL Final  . MPV 01/09/2016 9.1  7.5 - 12.5 fL Final  . Neutro Abs 01/09/2016 5580  1,500 - 7,800 cells/uL Final  . Lymphs Abs 01/09/2016 2430  850 - 3,900 cells/uL Final  . Monocytes Absolute 01/09/2016 720  200 - 950 cells/uL Final  . Eosinophils Absolute 01/09/2016 270  15 - 500 cells/uL Final  . Basophils Absolute 01/09/2016 0  0 - 200 cells/uL Final  . Neutrophils Relative % 01/09/2016 62  % Final  . Lymphocytes Relative 01/09/2016 27  % Final  . Monocytes Relative 01/09/2016 8  % Final  . Eosinophils Relative 01/09/2016 3  % Final  . Basophils Relative 01/09/2016 0  % Final  . Smear Review 01/09/2016 Criteria for review not met   Final  . Sodium 01/09/2016 139  135 - 146 mmol/L Final  . Potassium 01/09/2016 4.2  3.5 - 5.3 mmol/L Final  . Chloride 01/09/2016 107  98 - 110 mmol/L Final  . CO2 01/09/2016 23  20 - 31  mmol/L Final  . Glucose, Bld 01/09/2016 112* 65 - 99 mg/dL Final  . BUN 01/09/2016 25  7 - 25 mg/dL Final  . Creat 01/09/2016 1.11* 0.50 - 0.99 mg/dL Final   Comment:   For patients > or = 64 years of age: The upper reference limit for Creatinine is approximately 13% higher for people identified as African-American.     . Total Bilirubin 01/09/2016 0.2  0.2 - 1.2 mg/dL Final  . Alkaline Phosphatase 01/09/2016 56  33 - 130 U/L Final  . AST 01/09/2016 13  10 - 35 U/L Final  . ALT 01/09/2016 14  6 - 29 U/L Final  . Total Protein 01/09/2016 6.3  6.1 - 8.1 g/dL Final  . Albumin 01/09/2016 4.3  3.6 - 5.1 g/dL Final  . Calcium 01/09/2016 9.3  8.6 - 10.4 mg/dL Final  . GFR, Est African American 01/09/2016 61  >=60 mL/min Final  . GFR, Est Non African American 01/09/2016 53* >=60 mL/min Final  Abstract on 01/05/2016  Component Date Value Ref Range Status  . Hemoglobin 05/18/2015 13.7  12.0 - 16.0 g/dL Final  . HCT 05/18/2015 42  36 - 46 % Final  . Platelets 05/18/2015 265  150 - 399 K/L Final  . WBC 05/18/2015 10.3  10^3/mL Final  . Glucose 05/18/2015 153  mg/dL Final  . BUN 05/18/2015 25* 4 - 21 mg/dL Final  . Creatinine 05/18/2015 1.0  0.5 - 1.1 mg/dL Final  . Potassium 05/18/2015 4.5  3.4 - 5.3 mmol/L Final  . Sodium 05/18/2015 140  137 - 147 mmol/L Final  . Alkaline Phosphatase 05/18/2015 78  25 - 125 U/L Final  . ALT 05/18/2015 12  7 - 35 U/L Final  . AST 05/18/2015 12* 13 - 35 U/L Final  . Bilirubin, Total 05/18/2015 0.3  mg/dL Final     Imaging: No results found.  Speciality Comments: No specialty comments available.  Procedures:  No procedures performed Allergies: Fluoxetine hcl (pmdd); Hydroxychloroquine; and Vicodin [hydrocodone-acetaminophen]   Assessment / Plan:     Visit Diagnoses: High risk medications (not anticoagulants) long-term use - SSZ EN 1000 mg in a.m., 500 mg PM; adequate response - Plan: CBC with Differential/Platelet, COMPLETE METABOLIC PANEL WITH  GFR  Rheumatoid arthritis with positive rheumatoid factor, involving unspecified site (HCC) - +CCP;  Primary osteoarthritis of both knees  Primary osteoarthritis of both hands  Primary osteoarthritis of both feet   Plan: #1: Rheumatoid arthritis with positive rheumatoid factor and CCP. No flare since the last visit. No joint pain, swelling, stiffness.  #2: High risk prescription Last refill of sulfasalazine was done on 05/08/2016. Patient was told verbally at the last office visit with Dr. Estanislado Pandy to change the sulfasalazine to the following new dose: 2 pills in the morning and 1 pill at night. However, when we refilled it on 05/08/2016, we wrote for 2 pills in the morning and 2 pills at night but patient has been taking it the way she was instructed by Dr. Estanislado Pandy verbally at the December office visit.  #3: On March 2018 labs, Mild elevation of creatinine at 1.04, and mild decrease in GFR at 57. We have adjusted the patient's sulfasalazine where she is taking only 2 pills in the morning and 1 pill at night. I would like to do labs today to see if her kidney function is improved some. Note: Patient is a diabetic and may have slight decrease in kidney function due to her diabetes.  #4: Osteoarthritis of the hands, knees, feet. Doing relatively well. Mild pain and discomfort at times.  #5: Return to clinic in 5 months  Orders: Orders Placed This Encounter  Procedures  . CBC with Differential/Platelet  . COMPLETE METABOLIC PANEL WITH GFR   No orders of the defined types were placed in this encounter.   Face-to-face time spent with patient was 30 minutes. 50% of time was spent in counseling and coordination of care.  Follow-Up Instructions: Return in about 5 months (around 11/07/2016) for RA,SSZ 1045m am, 5044mpm; elevated kidney fnx; .   NaEliezer LoftsPA-C  Note - This record has been created using [employer] Chart creation errors have been sought, but may  not always  have been located. Such creation errors do not reflect on  the standard of medical care.

## 2016-06-08 ENCOUNTER — Ambulatory Visit: Payer: Medicare Other | Admitting: Rheumatology

## 2016-06-09 NOTE — Progress Notes (Signed)
stable °

## 2016-06-18 ENCOUNTER — Other Ambulatory Visit: Payer: Self-pay | Admitting: Unknown Physician Specialty

## 2016-06-18 DIAGNOSIS — Z1231 Encounter for screening mammogram for malignant neoplasm of breast: Secondary | ICD-10-CM

## 2016-07-12 ENCOUNTER — Ambulatory Visit
Admission: RE | Admit: 2016-07-12 | Discharge: 2016-07-12 | Disposition: A | Discharge status: Home / Self Care | Payer: Medicare Other | Source: Ambulatory Visit | Attending: Unknown Physician Specialty | Admitting: Unknown Physician Specialty

## 2016-07-12 DIAGNOSIS — Z1231 Encounter for screening mammogram for malignant neoplasm of breast: Secondary | ICD-10-CM

## 2016-08-12 ENCOUNTER — Other Ambulatory Visit: Payer: Self-pay | Admitting: Rheumatology

## 2016-08-13 NOTE — Telephone Encounter (Signed)
Last Visit: 06/07/16 Next Visit: 11/07/16 Labs: 06/07/16 Stable  Okay to refill per Dr. Corliss Skains

## 2016-10-23 ENCOUNTER — Ambulatory Visit (INDEPENDENT_AMBULATORY_CARE_PROVIDER_SITE_OTHER): Payer: Medicare Other | Admitting: Rheumatology

## 2016-10-23 ENCOUNTER — Encounter: Payer: Self-pay | Admitting: Rheumatology

## 2016-10-23 VITALS — BP 131/65 | HR 94 | Resp 12 | Ht 62.5 in | Wt 188.0 lb

## 2016-10-23 DIAGNOSIS — M19041 Primary osteoarthritis, right hand: Secondary | ICD-10-CM | POA: Diagnosis not present

## 2016-10-23 DIAGNOSIS — M19072 Primary osteoarthritis, left ankle and foot: Secondary | ICD-10-CM | POA: Diagnosis not present

## 2016-10-23 DIAGNOSIS — M170 Bilateral primary osteoarthritis of knee: Secondary | ICD-10-CM

## 2016-10-23 DIAGNOSIS — Z79899 Other long term (current) drug therapy: Secondary | ICD-10-CM

## 2016-10-23 DIAGNOSIS — M19042 Primary osteoarthritis, left hand: Secondary | ICD-10-CM

## 2016-10-23 DIAGNOSIS — M19071 Primary osteoarthritis, right ankle and foot: Secondary | ICD-10-CM

## 2016-10-23 DIAGNOSIS — M059 Rheumatoid arthritis with rheumatoid factor, unspecified: Secondary | ICD-10-CM | POA: Diagnosis not present

## 2016-10-23 MED ORDER — SULFASALAZINE 500 MG PO TBEC: DELAYED_RELEASE_TABLET | ORAL | 0 refills | Status: DC

## 2016-10-23 NOTE — Progress Notes (Signed)
Office Visit Note  Patient: Margaret [name]             Date of Birth: May 04, 1950           MRN: [id number]             PCP: Monico Blitz, MD Referring: Monico Blitz, MD Visit Date: 10/23/2016 Occupation: '@GUAROCC' @    Subjective:  No chief complaint on file.   History of Present Illness: Margaret [name] is a 66 y.o. female  Last seen in our office on 06/07/2016 for rheumatoid arthritis with positives rheumatoid factor and positive CCP and high risk prescription (sulfasalazine 1000 mg in the morning and 500 mg in the evening) and mildly elevated creatinine at 1.04 and mildly decreased GFR at 57. It is because the kidney functions were mildly abnormal that we change the sulfasalazine dose to 3 pills a day.  Today, patient states she is doing well in all of her joints except her knee joints have some pain. The pain in her knees are coming from osteoarthritis. She states that this is been going on for the last 2 or 3 months. She states that when she had her this go supplementation injections in her knees, they did really well for a long time until the last 2 or 3 months when they started hurting again. She rates her pain in her knees as well number to call it a 7 on a scale of 0-10. Patient also reports that she is having rheumatoid nodule starting again. She has them to bilateral elbows. She also has 2 growing on her second and third PIP joints (1 on each joint). When her finger strikes and objects sometimes those rheumatoid nodules are slightly painful. Overall she is tolerating these nodules well. If they get larger and more uncomfortable, she knows that she can make an appointment for Dr. Estanislado Pandy to look and see if any of them can be injected with steroid.   Activities of Daily Living:  Patient reports morning stiffness for 15 minutes.   Patient Denies nocturnal pain.  Difficulty dressing/grooming: Denies Difficulty climbing stairs: Denies Difficulty getting out of chair:  Denies Difficulty using hands for taps, buttons, cutlery, and/or writing: Denies   Review of Systems  Constitutional: Negative for fatigue.  HENT: Negative for mouth sores and mouth dryness.   Eyes: Negative for dryness.  Respiratory: Negative for shortness of breath.   Gastrointestinal: Negative for constipation and diarrhea.  Musculoskeletal: Negative for myalgias and myalgias.  Skin: Negative for sensitivity to sunlight.  Psychiatric/Behavioral: Negative for decreased concentration and sleep disturbance.    PMFS History:  Patient Active Problem List   Diagnosis Date Noted  . Smoker 01/09/2016  . High risk medication use 01/06/2016  . RA (rheumatoid arthritis) ([place]) 01/05/2016  . DDD (degenerative disc disease), lumbar 01/05/2016  . Calcaneal spur 01/05/2016  . Bipolar disorder ([place]) 01/05/2016  . ADD (attention deficit disorder) 01/05/2016  . Sleep apnea 01/05/2016  . TMJ (dislocation of temporomandibular joint) 01/05/2016  . Pedal edema 01/05/2016  . Osteoarthritis of both hands 01/05/2016  . Osteoarthritis of both feet 01/05/2016  . Osteoarthritis of both knees 01/05/2016  . GERD (gastroesophageal reflux disease) 06/24/2015  . Constipation 06/24/2015  . Spondylolisthesis at L4-L5 level 01/24/2015  . 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 CONTRACTURE OF SHOULDER JOINT 04/24/2007  . DIABETES 07/30/2006    Past Medical History:  Diagnosis Date  . ADD (attention deficit disorder) 01/05/2016  . Arthritis   . Asthma   .  Benign essential hypertension   . Bipolar disorder [facility])    hospitalized at [facility] for manic episodes  . Calcaneal spur 01/05/2016  . DDD (degenerative disc disease), lumbar 01/05/2016  . Diabetes mellitus without complication ([place])   . Diabetic neuropathy ([place])   . Dysrhythmia    takes diltiazem prescribed by medical doctor; has never seen cardiologist  . Eczema   . Gastroesophageal reflux disease   . Hyperlipidemia   . Hypersomnia   . IBS (irritable  bowel syndrome)   . Myalgia   . Noncompliance with CPAP treatment   . Osteoarthritis of both feet 01/05/2016  . Osteoarthritis of both hands 01/05/2016  . Osteoarthritis of both knees 01/05/2016  . Pedal edema 01/05/2016  . Peripheral vascular disease ([place])   . RA (rheumatoid arthritis) ([place]) 01/05/2016   +RF, +CCP, Nodulous,   . Rheumatoid arthritis ([place])   . Sleep apnea 2015   home sleep study   . TMJ (dislocation of temporomandibular joint) 01/05/2016  . Varicose veins     Family History  Problem Relation Age of Onset  . Hypertension Father   . AAA (abdominal aortic aneurysm) Brother   . Colon cancer Neg Hx   . Colon polyps Neg Hx    Past Surgical History:  Procedure Laterality Date  . BACK SURGERY    . CESAREAN SECTION    . COLONOSCOPY  2011   Dr. Collene Mares in [place]  . ELBOW SURGERY    . ENDOVENOUS ABLATION SAPHENOUS VEIN W/ LASER     Social History   Social History Narrative  . No narrative on file     Objective: Vital Signs: BP 131/65   Pulse 94   Resp 12   Ht 5' 2.5" (1.588 m)   Wt 188 lb (85.3 kg)   BMI 33.84 kg/m    Physical Exam  Constitutional: She is oriented to person, place, and time. She appears well-developed and well-nourished.  HENT:  Head: Normocephalic and atraumatic.  Eyes: Pupils are equal, round, and reactive to light. EOM are normal.  Cardiovascular: Normal rate, regular rhythm and normal heart sounds.  Exam reveals no gallop and no friction rub.   No murmur heard. Pulmonary/Chest: Effort normal and breath sounds normal. She has no wheezes. She has no rales.  Abdominal: Soft. Bowel sounds are normal. She exhibits no distension. There is no tenderness. There is no guarding. No hernia.  Musculoskeletal: Normal range of motion. She exhibits no edema, tenderness or deformity.  Lymphadenopathy:    She has no cervical adenopathy.  Neurological: She is alert and oriented to person, place, and time. Coordination normal.  Skin: Skin is warm  and dry. Capillary refill takes less than 2 seconds. No rash noted.  Psychiatric: She has a normal mood and affect. Her behavior is normal.  Nursing note and vitals reviewed.    Musculoskeletal Exam:  Full range of motion of all joints Grip strength is equal and strong bilaterally Fibromyalgia tender points are all absent  CDAI Exam: No CDAI exam completed.  No synovitis on examination  Investigation: No additional findings. Office Visit on 06/07/2016  Component Date Value Ref Range Status  . WBC 06/07/2016 9.6  3.8 - 10.8 K/uL Final  . RBC 06/07/2016 4.55  3.80 - 5.10 MIL/uL Final  . Hemoglobin 06/07/2016 13.7  11.7 - 15.5 g/dL Final  . HCT 06/07/2016 41.9  35.0 - 45.0 % Final  . MCV 06/07/2016 92.1  80.0 - 100.0 fL Final  . MCH 06/07/2016 30.1  27.0 - 33.0 pg Final  . MCHC 06/07/2016 32.7  32.0 - 36.0 g/dL Final  . RDW 06/07/2016 14.8  11.0 - 15.0 % Final  . Platelets 06/07/2016 285  140 - 400 K/uL Final  . MPV 06/07/2016 9.4  7.5 - 12.5 fL Final  . Neutro Abs 06/07/2016 5760  1,500 - 7,800 cells/uL Final  . Lymphs Abs 06/07/2016 2592  850 - 3,900 cells/uL Final  . Monocytes Absolute 06/07/2016 768  200 - 950 cells/uL Final  . Eosinophils Absolute 06/07/2016 384  15 - 500 cells/uL Final  . Basophils Absolute 06/07/2016 96  0 - 200 cells/uL Final  . Neutrophils Relative % 06/07/2016 60  % Final  . Lymphocytes Relative 06/07/2016 27  % Final  . Monocytes Relative 06/07/2016 8  % Final  . Eosinophils Relative 06/07/2016 4  % Final  . Basophils Relative 06/07/2016 1  % Final  . Smear Review 06/07/2016 Criteria for review not met   Final  . Sodium 06/07/2016 139  135 - 146 mmol/L Final  . Potassium 06/07/2016 4.3  3.5 - 5.3 mmol/L Final  . Chloride 06/07/2016 110  98 - 110 mmol/L Final  . CO2 06/07/2016 18* 20 - 31 mmol/L Final  . Glucose, Bld 06/07/2016 99  65 - 99 mg/dL Final  . BUN 06/07/2016 28* 7 - 25 mg/dL Final  . Creat 06/07/2016 1.04* 0.50 - 0.99 mg/dL Final    Comment:   For patients > or = 64 years of age: The upper reference limit for Creatinine is approximately 13% higher for people identified as African-American.     . Total Bilirubin 06/07/2016 0.3  0.2 - 1.2 mg/dL Final  . Alkaline Phosphatase 06/07/2016 59  33 - 130 U/L Final  . AST 06/07/2016 12  10 - 35 U/L Final  . ALT 06/07/2016 13  6 - 29 U/L Final  . Total Protein 06/07/2016 6.4  6.1 - 8.1 g/dL Final  . Albumin 06/07/2016 4.2  3.6 - 5.1 g/dL Final  . Calcium 06/07/2016 9.2  8.6 - 10.4 mg/dL Final  . GFR, Est African American 06/07/2016 66  >=60 mL/min Final  . GFR, Est Non African American 06/07/2016 57* >=60 mL/min Final     Imaging: No results found.  Speciality Comments: No specialty comments available.    Procedures:  No procedures performed Allergies: Fluoxetine hcl (pmdd); Hydroxychloroquine; and Vicodin [hydrocodone-acetaminophen]   Assessment / Plan:     Visit Diagnoses: Rheumatoid arthritis with positive rheumatoid factor, involving unspecified site ([place]) - Plan: CBC with Differential/Platelet, COMPLETE METABOLIC PANEL WITH GFR  High risk medications (not anticoagulants) long-term use - Plan: CBC with Differential/Platelet, COMPLETE METABOLIC PANEL WITH GFR  Primary osteoarthritis of both knees  Primary osteoarthritis of both hands  Primary osteoarthritis of both feet     Plan: #1: Rheumatoid arthritis with positive rheumatoid factor and positive CCP Doing well. No joint pain, stiffness, swelling.  #2: High risk prescription Sulfasalazine 1000 mg in the morning and 500 mg in the evening Patient has mildly elevated creatinine and decreased GFR on March and May labs. As a result, we have decreased the sulfasalazine slightly.  #3: Abnormal kidney function March 2018 in May 2018, creatinine at 1.04 and GFR at 57  #4: Osteoarthritis of bilateral hands knees and feet. Mild pain and discomfort at times  #5: Return to clinic in 5 months  #6: CBC with  differential and CMP with GFR today  #7: Bilateral knee OA. In the past, patient has  asked [place] to prior authorize her Visco supplementation. She picked up the Visco from our office and took it to her PCPs office for them to injected into her knee. I've asked the patient that if her PCP will be doing the injections in the future, they should do the prior authorization as well. Patient understands and is agreeable.  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 The nodules are small at this time. When they become more enlarged and uncomfortable, she now she can make an appointment with Dr. Estanislado Pandy for her to evaluate and see if they can be treated with cortisone injections. Patient understands and is agreeable.    Orders: Orders Placed This Encounter  Procedures  . CBC with Differential/Platelet  . COMPLETE METABOLIC PANEL WITH GFR   No orders of the defined types were placed in this encounter.   Face-to-face time spent with patient was 30 minutes. 50% of time was spent in counseling and coordination of care.  Follow-Up Instructions: No Follow-up on file.   Eliezer Lofts, PA-C  Note - This record has been created using [employer].  Chart creation errors have been sought, but may not always  have been located. Such creation errors do not reflect on  the standard of medical care.

## 2016-10-24 LAB — CBC WITH DIFFERENTIAL/PLATELET
BASOS ABS: 62 {cells}/uL (ref 0–200)
Basophils Relative: 0.7 %
EOS ABS: 249 {cells}/uL (ref 15–500)
EOS PCT: 2.8 %
HCT: 38.7 % (ref 35.0–45.0)
Hemoglobin: 12.6 g/dL (ref 11.7–15.5)
LYMPHS ABS: 1851 {cells}/uL (ref 850–3900)
MCH: 29.3 pg (ref 27.0–33.0)
MCHC: 32.6 g/dL (ref 32.0–36.0)
MCV: 90 fL (ref 80.0–100.0)
MPV: 9.3 fL (ref 7.5–12.5)
Monocytes Relative: 6.9 %
NEUTROS ABS: 6123 {cells}/uL (ref 1500–7800)
NEUTROS PCT: 68.8 %
PLATELETS: 262 10*3/uL (ref 140–400)
RBC: 4.3 10*6/uL (ref 3.80–5.10)
RDW: 14.5 % (ref 11.0–15.0)
Total Lymphocyte: 20.8 %
WBC mixed population: 614 cells/uL (ref 200–950)
WBC: 8.9 10*3/uL (ref 3.8–10.8)

## 2016-10-24 LAB — COMPLETE METABOLIC PANEL WITH GFR
AG RATIO: 2.3 (calc) (ref 1.0–2.5)
ALBUMIN MSPROF: 4.4 g/dL (ref 3.6–5.1)
ALKALINE PHOSPHATASE (APISO): 52 U/L (ref 33–130)
ALT: 13 U/L (ref 6–29)
AST: 16 U/L (ref 10–35)
BILIRUBIN TOTAL: 0.3 mg/dL (ref 0.2–1.2)
BUN / CREAT RATIO: 17 (calc) (ref 6–22)
BUN: 17 mg/dL (ref 7–25)
CHLORIDE: 110 mmol/L (ref 98–110)
CO2: 20 mmol/L (ref 20–32)
Calcium: 9.1 mg/dL (ref 8.6–10.4)
Creat: 1.03 mg/dL — ABNORMAL HIGH (ref 0.50–0.99)
GFR, EST AFRICAN AMERICAN: 67 mL/min/{1.73_m2} (ref >=60)
GFR, Est Non African American: 57 mL/min/{1.73_m2} — ABNORMAL LOW (ref >=60)
GLUCOSE: 147 mg/dL — AB (ref 65–99)
Globulin: 1.9 g/dL (calc) (ref 1.9–3.7)
POTASSIUM: 4.3 mmol/L (ref 3.5–5.3)
SODIUM: 141 mmol/L (ref 135–146)
TOTAL PROTEIN: 6.3 g/dL (ref 6.1–8.1)

## 2016-11-07 ENCOUNTER — Ambulatory Visit: Payer: Medicare Other | Admitting: Rheumatology

## 2017-01-25 ENCOUNTER — Other Ambulatory Visit: Payer: Self-pay | Admitting: Rheumatology

## 2017-01-25 NOTE — Telephone Encounter (Signed)
Last Visit: 10/23/16 Next Visit: 03/26/17 Labs: 10/23/16 stable  Left message to advise patient she is due for labs.   Okay to refill 30 day supply per Dr. Corliss Skains.

## 2017-02-12 ENCOUNTER — Other Ambulatory Visit: Payer: Self-pay

## 2017-02-12 DIAGNOSIS — Z79899 Other long term (current) drug therapy: Secondary | ICD-10-CM

## 2017-02-12 LAB — COMPLETE METABOLIC PANEL WITH GFR
AG Ratio: 1.8 (calc) (ref 1.0–2.5)
ALBUMIN MSPROF: 4.3 g/dL (ref 3.6–5.1)
ALKALINE PHOSPHATASE (APISO): 69 U/L (ref 33–130)
ALT: 11 U/L (ref 6–29)
AST: 11 U/L (ref 10–35)
BUN / CREAT RATIO: 15 (calc) (ref 6–22)
BUN: 18 mg/dL (ref 7–25)
CO2: 22 mmol/L (ref 20–32)
CREATININE: 1.23 mg/dL — AB (ref 0.50–0.99)
Calcium: 9.2 mg/dL (ref 8.6–10.4)
Chloride: 107 mmol/L (ref 98–110)
GFR, EST AFRICAN AMERICAN: 54 mL/min/{1.73_m2} — AB (ref >=60)
GFR, Est Non African American: 46 mL/min/{1.73_m2} — ABNORMAL LOW (ref >=60)
Globulin: 2.4 g/dL (calc) (ref 1.9–3.7)
Glucose, Bld: 124 mg/dL — ABNORMAL HIGH (ref 65–99)
Potassium: 4.8 mmol/L (ref 3.5–5.3)
Sodium: 140 mmol/L (ref 135–146)
TOTAL PROTEIN: 6.7 g/dL (ref 6.1–8.1)
Total Bilirubin: 0.3 mg/dL (ref 0.2–1.2)

## 2017-02-12 LAB — CBC WITH DIFFERENTIAL/PLATELET
BASOS ABS: 90 {cells}/uL (ref 0–200)
Basophils Relative: 1 %
Eosinophils Absolute: 387 cells/uL (ref 15–500)
Eosinophils Relative: 4.3 %
HEMATOCRIT: 38.8 % (ref 35.0–45.0)
Hemoglobin: 12.4 g/dL (ref 11.7–15.5)
Lymphs Abs: 2115 cells/uL (ref 850–3900)
MCH: 28.7 pg (ref 27.0–33.0)
MCHC: 32 g/dL (ref 32.0–36.0)
MCV: 89.8 fL (ref 80.0–100.0)
MPV: 9.5 fL (ref 7.5–12.5)
Monocytes Relative: 6.8 %
NEUTROS PCT: 64.4 %
Neutro Abs: 5796 cells/uL (ref 1500–7800)
PLATELETS: 295 10*3/uL (ref 140–400)
RBC: 4.32 10*6/uL (ref 3.80–5.10)
RDW: 14.5 % (ref 11.0–15.0)
TOTAL LYMPHOCYTE: 23.5 %
WBC: 9 10*3/uL (ref 3.8–10.8)
WBCMIX: 612 {cells}/uL (ref 200–950)

## 2017-02-13 NOTE — Progress Notes (Signed)
CBC normal

## 2017-02-13 NOTE — Progress Notes (Signed)
Elevated creatinine probably related to use of Prinivil. Please fax results to her PCP. She can continue on the current dose of sulfasalazine. Recheck labs in 3 months as scheduled.

## 2017-03-12 NOTE — Progress Notes (Deleted)
Office Visit Note  Patient: Margaret [name]             Date of Birth: 1950/09/04           MRN: [id number]             PCP: Kirstie Peri, MD Referring: Kirstie Peri, MD Visit Date: 03/26/2017 Occupation: @GUAROCC @    Subjective:  No chief complaint on file.   History of Present Illness: VON INSCOE is a 67 y.o. female ***   Activities of Daily Living:  Patient reports morning stiffness for *** {minute/hour:19697}.   Patient {ACTIONS;DENIES/REPORTS:21021675::"Denies"} nocturnal pain.  Difficulty dressing/grooming: {ACTIONS;DENIES/REPORTS:21021675::"Denies"} Difficulty climbing stairs: {ACTIONS;DENIES/REPORTS:21021675::"Denies"} Difficulty getting out of chair: {ACTIONS;DENIES/REPORTS:21021675::"Denies"} Difficulty using hands for taps, buttons, cutlery, and/or writing: {ACTIONS;DENIES/REPORTS:21021675::"Denies"}   No Rheumatology ROS completed.   PMFS History:  Patient Active Problem List   Diagnosis Date Noted  . Smoker 01/09/2016  . High risk medication use 01/06/2016  . RA (rheumatoid arthritis) ([facility]) 01/05/2016  . DDD (degenerative disc disease), lumbar 01/05/2016  . Calcaneal spur 01/05/2016  . Bipolar disorder ([facility]) 01/05/2016  . ADD (attention deficit disorder) 01/05/2016  . Sleep apnea 01/05/2016  . TMJ (dislocation of temporomandibular joint) 01/05/2016  . Pedal edema 01/05/2016  . Osteoarthritis of both hands 01/05/2016  . Osteoarthritis of both feet 01/05/2016  . Osteoarthritis of both knees 01/05/2016  . GERD (gastroesophageal reflux disease) 06/24/2015  . Constipation 06/24/2015  . Spondylolisthesis at L4-L5 level 01/24/2015  . 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 CONTRACTURE OF SHOULDER JOINT 04/24/2007  . DIABETES 07/30/2006    Past Medical History:  Diagnosis Date  . ADD (attention deficit disorder) 01/05/2016  . Arthritis   . Asthma   . Benign essential hypertension   . Bipolar disorder [facility])    hospitalized at [facility] for manic episodes  .  Calcaneal spur 01/05/2016  . DDD (degenerative disc disease), lumbar 01/05/2016  . Diabetes mellitus without complication ([facility])   . Diabetic neuropathy ([facility])   . Dysrhythmia    takes diltiazem prescribed by medical doctor; has never seen cardiologist  . Eczema   . Gastroesophageal reflux disease   . Hyperlipidemia   . Hypersomnia   . IBS (irritable bowel syndrome)   . Myalgia   . Noncompliance with CPAP treatment   . Osteoarthritis of both feet 01/05/2016  . Osteoarthritis of both hands 01/05/2016  . Osteoarthritis of both knees 01/05/2016  . Pedal edema 01/05/2016  . Peripheral vascular disease ([facility])   . RA (rheumatoid arthritis) ([facility]) 01/05/2016   +RF, +CCP, Nodulous,   . Rheumatoid arthritis ([facility])   . Sleep apnea 2015   home sleep study   . TMJ (dislocation of temporomandibular joint) 01/05/2016  . Varicose veins     Family History  Problem Relation Age of Onset  . Hypertension Father   . AAA (abdominal aortic aneurysm) Brother   . Colon cancer Neg Hx   . Colon polyps Neg Hx    Past Surgical History:  Procedure Laterality Date  . BACK SURGERY    . CESAREAN SECTION    . COLONOSCOPY  2011   Dr. 2012 in [place]  . ELBOW SURGERY    . ENDOVENOUS ABLATION SAPHENOUS VEIN W/ LASER     Social History   Social History Narrative  . Not on file     Objective: Vital Signs: There were no vitals taken for this visit.   Physical Exam   Musculoskeletal Exam: ***  CDAI Exam: No CDAI exam completed.  Investigation: No additional findings. CBC Latest Ref Rng & Units 02/12/2017 10/23/2016 06/07/2016  WBC 3.8 - 10.8 Thousand/uL 9.0 8.9 9.6  Hemoglobin 11.7 - 15.5 g/dL 29.4 76.5 46.5  Hematocrit 35.0 - 45.0 % 38.8 38.7 41.9  Platelets 140 - 400 Thousand/uL 295 262 285   CMP Latest Ref Rng & Units 02/12/2017 10/23/2016 06/07/2016  Glucose 65 - 99 mg/dL 035(W) 656(C) 99  BUN 7 - 25 mg/dL 18 17 12(X)  Creatinine 0.50 - 0.99 mg/dL 5.17(G) 0.17(C) 9.44(H)  Sodium 135 - 146  mmol/L 140 141 139  Potassium 3.5 - 5.3 mmol/L 4.8 4.3 4.3  Chloride 98 - 110 mmol/L 107 110 110  CO2 20 - 32 mmol/L 22 20 18(L)  Calcium 8.6 - 10.4 mg/dL 9.2 9.1 9.2  Total Protein 6.1 - 8.1 g/dL 6.7 6.3 6.4  Total Bilirubin 0.2 - 1.2 mg/dL 0.3 0.3 0.3  Alkaline Phos 33 - 130 U/L - - 59  AST 10 - 35 U/L 11 16 12   ALT 6 - 29 U/L 11 13 13     Imaging: No results found.  Speciality Comments: No specialty comments available.    Procedures:  No procedures performed Allergies: Fluoxetine hcl (pmdd); Hydroxychloroquine; and Vicodin [hydrocodone-acetaminophen]   Assessment / Plan:     Visit Diagnoses: No diagnosis found.    Orders: No orders of the defined types were placed in this encounter.  No orders of the defined types were placed in this encounter.   Face-to-face time spent with patient was *** minutes. 50% of time was spent in counseling and coordination of care.  Follow-Up Instructions: No Follow-up on file.   , CMA  Note - This record has been created using .  Chart creation errors have been sought, but may not always  have been located. Such creation errors do not reflect on  the standard of medical care.

## 2017-03-25 NOTE — Progress Notes (Deleted)
Office Visit Note  Patient: Margaret [name]             Date of Birth: [birth year]           MRN: [id number]             PCP: Kirstie Peri, MD Referring: Kirstie Peri, MD Visit Date: 03/27/2017 Occupation: @GUAROCC @    Subjective:  No chief complaint on file.   History of Present Illness: Margaret [name] is a 67 y.o. female ***   Activities of Daily Living:  Patient reports morning stiffness for *** {minute/hour:19697}.   Patient {ACTIONS;DENIES/REPORTS:21021675::"Denies"} nocturnal pain.  Difficulty dressing/grooming: {ACTIONS;DENIES/REPORTS:21021675::"Denies"} Difficulty climbing stairs: {ACTIONS;DENIES/REPORTS:21021675::"Denies"} Difficulty getting out of chair: {ACTIONS;DENIES/REPORTS:21021675::"Denies"} Difficulty using hands for taps, buttons, cutlery, and/or writing: {ACTIONS;DENIES/REPORTS:21021675::"Denies"}   No Rheumatology ROS completed.   PMFS History:  Patient Active Problem List   Diagnosis Date Noted  . Smoker 01/09/2016  . High risk medication use 01/06/2016  . RA (rheumatoid arthritis) ([facility]) 01/05/2016  . DDD (degenerative disc disease), lumbar 01/05/2016  . Calcaneal spur 01/05/2016  . Bipolar disorder ([facility]) 01/05/2016  . ADD (attention deficit disorder) 01/05/2016  . Sleep apnea 01/05/2016  . TMJ (dislocation of temporomandibular joint) 01/05/2016  . Pedal edema 01/05/2016  . Osteoarthritis of both hands 01/05/2016  . Osteoarthritis of both feet 01/05/2016  . Osteoarthritis of both knees 01/05/2016  . GERD (gastroesophageal reflux disease) 06/24/2015  . Constipation 06/24/2015  . Spondylolisthesis at L4-L5 level 01/24/2015  . 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 CONTRACTURE OF SHOULDER JOINT 04/24/2007  . DIABETES 07/30/2006    Past Medical History:  Diagnosis Date  . ADD (attention deficit disorder) 01/05/2016  . Arthritis   . Asthma   . Benign essential hypertension   . Bipolar disorder [facility])    hospitalized at [facility] for manic episodes  .  Calcaneal spur 01/05/2016  . DDD (degenerative disc disease), lumbar 01/05/2016  . Diabetes mellitus without complication ([facility])   . Diabetic neuropathy ([facility])   . Dysrhythmia    takes diltiazem prescribed by medical doctor; has never seen cardiologist  . Eczema   . Gastroesophageal reflux disease   . Hyperlipidemia   . Hypersomnia   . IBS (irritable bowel syndrome)   . Myalgia   . Noncompliance with CPAP treatment   . Osteoarthritis of both feet 01/05/2016  . Osteoarthritis of both hands 01/05/2016  . Osteoarthritis of both knees 01/05/2016  . Pedal edema 01/05/2016  . Peripheral vascular disease ([facility])   . RA (rheumatoid arthritis) ([facility]) 01/05/2016   +RF, +CCP, Nodulous,   . Rheumatoid arthritis ([facility])   . Sleep apnea 2015   home sleep study   . TMJ (dislocation of temporomandibular joint) 01/05/2016  . Varicose veins     Family History  Problem Relation Age of Onset  . Hypertension Father   . AAA (abdominal aortic aneurysm) Brother   . Colon cancer Neg Hx   . Colon polyps Neg Hx    Past Surgical History:  Procedure Laterality Date  . BACK SURGERY    . CESAREAN SECTION    . COLONOSCOPY  2011   Dr. 2012 in [place]  . ELBOW SURGERY    . ENDOVENOUS ABLATION SAPHENOUS VEIN W/ LASER     Social History   Social History Narrative  . Not on file     Objective: Vital Signs: There were no vitals taken for this visit.   Physical Exam   Musculoskeletal Exam: ***  CDAI Exam: No CDAI exam completed.  Investigation: No additional findings. CBC Latest Ref Rng & Units 02/12/2017 10/23/2016 06/07/2016  WBC 3.8 - 10.8 Thousand/uL 9.0 8.9 9.6  Hemoglobin 11.7 - 15.5 g/dL 29.4 76.5 46.5  Hematocrit 35.0 - 45.0 % 38.8 38.7 41.9  Platelets 140 - 400 Thousand/uL 295 262 285   CMP Latest Ref Rng & Units 02/12/2017 10/23/2016 06/07/2016  Glucose 65 - 99 mg/dL 035(W) 656(C) 99  BUN 7 - 25 mg/dL 18 17 12(X)  Creatinine 0.50 - 0.99 mg/dL 5.17(G) 0.17(C) 9.44(H)  Sodium 135 - 146  mmol/L 140 141 139  Potassium 3.5 - 5.3 mmol/L 4.8 4.3 4.3  Chloride 98 - 110 mmol/L 107 110 110  CO2 20 - 32 mmol/L 22 20 18(L)  Calcium 8.6 - 10.4 mg/dL 9.2 9.1 9.2  Total Protein 6.1 - 8.1 g/dL 6.7 6.3 6.4  Total Bilirubin 0.2 - 1.2 mg/dL 0.3 0.3 0.3  Alkaline Phos 33 - 130 U/L - - 59  AST 10 - 35 U/L 11 16 12   ALT 6 - 29 U/L 11 13 13     Imaging: No results found.  Speciality Comments: No specialty comments available.    Procedures:  No procedures performed Allergies: Fluoxetine hcl (pmdd); Hydroxychloroquine; and Vicodin [hydrocodone-acetaminophen]   Assessment / Plan:     Visit Diagnoses: No diagnosis found.    Orders: No orders of the defined types were placed in this encounter.  No orders of the defined types were placed in this encounter.   Face-to-face time spent with patient was *** minutes. 50% of time was spent in counseling and coordination of care.  Follow-Up Instructions: No Follow-up on file.   , CMA  Note - This record has been created using .  Chart creation errors have been sought, but may not always  have been located. Such creation errors do not reflect on  the standard of medical care.

## 2017-03-26 ENCOUNTER — Emergency Department (HOSPITAL_COMMUNITY): Payer: Medicare Other

## 2017-03-26 ENCOUNTER — Ambulatory Visit: Payer: Medicare Other | Admitting: Rheumatology

## 2017-03-26 ENCOUNTER — Emergency Department (HOSPITAL_COMMUNITY)
Admission: EM | Admit: 2017-03-26 | Discharge: 2017-03-26 | Disposition: A | Discharge status: Home / Self Care | Payer: Medicare Other | Attending: Emergency Medicine | Admitting: Emergency Medicine

## 2017-03-26 ENCOUNTER — Encounter (HOSPITAL_COMMUNITY): Payer: Self-pay | Admitting: Emergency Medicine

## 2017-03-26 ENCOUNTER — Other Ambulatory Visit: Payer: Self-pay

## 2017-03-26 DIAGNOSIS — Y929 Unspecified place or not applicable: Secondary | ICD-10-CM | POA: Diagnosis not present

## 2017-03-26 DIAGNOSIS — I10 Essential (primary) hypertension: Secondary | ICD-10-CM | POA: Diagnosis not present

## 2017-03-26 DIAGNOSIS — Y999 Unspecified external cause status: Secondary | ICD-10-CM | POA: Diagnosis not present

## 2017-03-26 DIAGNOSIS — S42291A Other displaced fracture of upper end of right humerus, initial encounter for closed fracture: Secondary | ICD-10-CM | POA: Diagnosis not present

## 2017-03-26 DIAGNOSIS — S4991XA Unspecified injury of right shoulder and upper arm, initial encounter: Secondary | ICD-10-CM | POA: Diagnosis present

## 2017-03-26 DIAGNOSIS — Z79899 Other long term (current) drug therapy: Secondary | ICD-10-CM | POA: Diagnosis not present

## 2017-03-26 DIAGNOSIS — Y939 Activity, unspecified: Secondary | ICD-10-CM | POA: Insufficient documentation

## 2017-03-26 DIAGNOSIS — W19XXXA Unspecified fall, initial encounter: Secondary | ICD-10-CM | POA: Diagnosis not present

## 2017-03-26 DIAGNOSIS — J45909 Unspecified asthma, uncomplicated: Secondary | ICD-10-CM | POA: Diagnosis not present

## 2017-03-26 DIAGNOSIS — Z7984 Long term (current) use of oral hypoglycemic drugs: Secondary | ICD-10-CM | POA: Insufficient documentation

## 2017-03-26 DIAGNOSIS — E1140 Type 2 diabetes mellitus with diabetic neuropathy, unspecified: Secondary | ICD-10-CM | POA: Diagnosis not present

## 2017-03-26 DIAGNOSIS — S42391A Other fracture of shaft of right humerus, initial encounter for closed fracture: Secondary | ICD-10-CM | POA: Diagnosis not present

## 2017-03-26 LAB — CBG MONITORING, ED: Glucose-Capillary: 187 mg/dL — ABNORMAL HIGH (ref 65–99)

## 2017-03-26 MED ORDER — OXYCODONE-ACETAMINOPHEN 5-325 MG PO TABS
1.0000 | ORAL_TABLET | ORAL | 0 refills | Status: DC | PRN
Start: 2017-03-26 — End: 2017-09-16

## 2017-03-26 MED ORDER — HYDROMORPHONE HCL 1 MG/ML IJ SOLN
1.0000 mg | Freq: Once | INTRAMUSCULAR | Status: AC
  Administered 2017-03-26: 1 mg via INTRAMUSCULAR
  Filled 2017-03-26: qty 1

## 2017-03-26 MED ORDER — ONDANSETRON HCL 4 MG PO TABS: 4.0000 mg | ORAL_TABLET | Freq: Four times a day (QID) | ORAL | 0 refills | Status: DC

## 2017-03-26 NOTE — ED Provider Notes (Signed)
[facility] EMERGENCY DEPARTMENT Provider Note   CSN: [id number] Arrival date & time: 03/26/17  1351     History   Chief Complaint Chief Complaint  Patient presents with  . Arm Injury    HPI Margaret [name] is a 67 y.o. female.  Status post accidental trip and fall just prior to emergency visit.  Patient now complains of pain in the right upper extremity.  No head or neck injury.  Severity of pain is moderate to severe.  Palpation and movement make pain worse.      Past Medical History:  Diagnosis Date  . ADD (attention deficit disorder) 01/05/2016  . Arthritis   . Asthma   . Benign essential hypertension   . Bipolar disorder [facility])    hospitalized at [facility] for manic episodes  . Calcaneal spur 01/05/2016  . DDD (degenerative disc disease), lumbar 01/05/2016  . Diabetes mellitus without complication (HCC)   . Diabetic neuropathy (HCC)   . Dysrhythmia    takes diltiazem prescribed by medical doctor; has never seen cardiologist  . Eczema   . Gastroesophageal reflux disease   . Hyperlipidemia   . Hypersomnia   . IBS (irritable bowel syndrome)   . Myalgia   . Noncompliance with CPAP treatment   . Osteoarthritis of both feet 01/05/2016  . Osteoarthritis of both hands 01/05/2016  . Osteoarthritis of both knees 01/05/2016  . Pedal edema 01/05/2016  . Peripheral vascular disease (HCC)   . RA (rheumatoid arthritis) (HCC) 01/05/2016   +RF, +CCP, Nodulous,   . Rheumatoid arthritis (HCC)   . Sleep apnea 2015   home sleep study   . TMJ (dislocation of temporomandibular joint) 01/05/2016  . Varicose veins     Patient Active Problem List   Diagnosis Date Noted  . Smoker 01/09/2016  . High risk medication use 01/06/2016  . RA (rheumatoid arthritis) (HCC) 01/05/2016  . DDD (degenerative disc disease), lumbar 01/05/2016  . Calcaneal spur 01/05/2016  . Bipolar disorder (HCC) 01/05/2016  . ADD (attention deficit disorder) 01/05/2016  . Sleep apnea 01/05/2016  . TMJ  (dislocation of temporomandibular joint) 01/05/2016  . Pedal edema 01/05/2016  . Osteoarthritis of both hands 01/05/2016  . Osteoarthritis of both feet 01/05/2016  . Osteoarthritis of both knees 01/05/2016  . GERD (gastroesophageal reflux disease) 06/24/2015  . Constipation 06/24/2015  . Spondylolisthesis at L4-L5 level 01/24/2015  . 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 CONTRACTURE OF SHOULDER JOINT 04/24/2007  . DIABETES 07/30/2006    Past Surgical History:  Procedure Laterality Date  . BACK SURGERY    . CESAREAN SECTION    . COLONOSCOPY  2011   Dr. Loreta Ave in [place]  . ELBOW SURGERY    . ENDOVENOUS ABLATION SAPHENOUS VEIN W/ LASER      OB History    No data available       Home Medications    Prior to Admission medications   Medication Sig Start Date End Date Taking? Authorizing Provider  ALPRAZolam Prudy Feeler) 0.5 MG tablet Take 0.5 mg by mouth 3 (three) times daily as needed for anxiety.     [provider]  atomoxetine (STRATTERA) 60 MG capsule Take 60 mg by mouth daily.     [provider]  buPROPion (WELLBUTRIN XL) 300 MG 24 hr tablet Take 300 mg by mouth daily.     [provider]  diltiazem (TAZTIA XT) 180 MG 24 hr capsule Take 180 mg by mouth daily.     [provider]  Flaxseed,  Linseed, (FLAX SEED OIL PO) Take by mouth.    [provider]  folic acid (FOLVITE) 1 MG tablet Take by mouth.    [provider]  gabapentin (NEURONTIN) 100 MG capsule Take 100 mg by mouth 2 (two) times daily.    [provider]  Ginger, Zingiber officinalis, (GINGER EXTRACT) 250 MG CAPS Take by mouth.    [provider]  glimepiride (AMARYL) 4 MG tablet Take 4 mg by mouth daily with breakfast.     [provider]  glucosamine-chondroitin 500-400 MG tablet Take 1 tablet by mouth daily.    [provider]  linaclotide Karlene Einstein) 290 MCG CAPS capsule Take 1 capsule (290 mcg total) by mouth daily before  breakfast. Patient not taking: Reported on 01/09/2016 07/05/15   Gelene Mink, NP  Liraglutide (VICTOZA) 18 MG/3ML SOPN Inject 1.8 mg into the skin daily.     [provider]  lisinopril (PRINIVIL,ZESTRIL) 5 MG tablet Take 5 mg by mouth daily.     [provider]  metFORMIN (GLUCOPHAGE) 500 MG tablet Take 1,000 mg by mouth 2 (two) times daily with a meal.  11/04/14   [provider]  Misc Natural Products (TART CHERRY ADVANCED PO) Take 1 tablet by mouth daily.    [provider]  Omega-3 1000 MG CAPS Take by mouth.    [provider]  ondansetron (ZOFRAN) 4 MG tablet Take 1 tablet (4 mg total) by mouth every 6 (six) hours. 03/26/17   Donnetta Hutching, MD  oxyCODONE-acetaminophen (PERCOCET) 5-325 MG tablet Take 1-2 tablets by mouth every 4 (four) hours as needed. 03/26/17   Donnetta Hutching, MD  pantoprazole (PROTONIX) 40 MG tablet Take 40 mg by mouth 2 (two) times daily.     [provider]  sulfaSALAzine (AZULFIDINE) 500 MG EC tablet TAKE 2 TABLETS BY MOUTH IN THE MORNING AND 1 TABLET IN THE EVENING 01/25/17   Pollyann Savoy, MD  topiramate (TOPAMAX) 200 MG tablet Take 200 mg by mouth 2 (two) times daily.     [provider]  traMADol (ULTRAM) 50 MG tablet Take 50 mg by mouth every 6 (six) hours as needed.    [provider]  Turmeric 500 MG CAPS Take by mouth.    [provider]    Family History Family History  Problem Relation Age of Onset  . Hypertension Father   . AAA (abdominal aortic aneurysm) Brother   . Colon cancer Neg Hx   . Colon polyps Neg Hx     Social History Social History   Tobacco Use  . Smoking status: Current Every Day Smoker    Packs/day: 1.00    Years: 40.00    Pack years: 40.00    Types: Cigarettes  . Smokeless tobacco: Never Used  Substance Use Topics  . Alcohol use: No    Alcohol/week: 0.0 oz  . Drug use: No     Allergies   Fluoxetine hcl (pmdd); Hydroxychloroquine; and Vicodin  [hydrocodone-acetaminophen]   Review of Systems Review of Systems  All other systems reviewed and are negative.    Physical Exam Updated Vital Signs BP (!) 149/69 (BP Location: Left Arm)   Pulse 88   Temp 97.7 F (36.5 C) (Oral)   Resp 19   Ht 5\' 2"  (1.575 m)   Wt 85.7 kg (189 lb)   SpO2 100%   BMI 34.57 kg/m   Physical Exam  Constitutional: She is oriented to person, place, and time. She appears well-developed  and well-nourished.  HENT:  Head: Normocephalic and atraumatic.  Eyes: Conjunctivae are normal.  Neck: Neck supple.  Cardiovascular: Normal rate and regular rhythm.  Pulmonary/Chest: Effort normal and breath sounds normal.  Abdominal: Soft. Bowel sounds are normal.  Musculoskeletal:  Right upper extremity: Tender at the head and midshaft of the humerus  Neurological: She is alert and oriented to person, place, and time.  Skin: Skin is warm and dry.  Psychiatric: She has a normal mood and affect. Her behavior is normal.  Nursing note and vitals reviewed.    ED Treatments / Results  Labs (all labs ordered are listed, but only abnormal results are displayed) Labs Reviewed  CBG MONITORING, ED - Abnormal; Notable for the following components:      Result Value   Glucose-Capillary 187 (*)    All other components within normal limits    EKG  EKG Interpretation None       Radiology Dg Humerus Right  Result Date: 03/26/2017 CLINICAL DATA:  Right arm pain after fall today. EXAM: RIGHT HUMERUS - 2+ VIEW COMPARISON:  None. FINDINGS: Moderately displaced oblique fracture is seen involving the midshaft of the right humerus. Linear lucency is seen involving the proximal right humeral head and neck suggesting possible nondisplaced fracture. IMPRESSION: Moderately displaced oblique fracture involving right humeral midshaft. Linear lucency is seen involving proximal right humeral head and neck suggesting possible nondisplaced fracture. Electronically Signed   By:  Lupita Raider, M.D.   On: 03/26/2017 15:48    Procedures Procedures (including critical care time)  Medications Ordered in ED Medications  HYDROmorphone (DILAUDID) injection 1 mg (1 mg Intramuscular Given 03/26/17 1624)     Initial Impression / Assessment and Plan / ED Course  I have reviewed the triage vital signs and the nursing notes.  Pertinent labs & imaging results that were available during my care of the patient were reviewed by me and considered in my medical decision making (see chart for details).     Plain films reveal a oblique displaced fracture of the midshaft right humerus and probable nondisplaced fracture of the head of the humerus.  Discussed with orthopedic surgeon on call.  Sugar tong splint, immobilizer, pain medicine, ice, follow-up with ortho.  Final Clinical Impressions(s) / ED Diagnoses   Final diagnoses:  Fall, initial encounter  Other closed fracture of shaft of right humerus, initial encounter  Fracture of humeral head, closed, right, initial encounter    ED Discharge Orders        Ordered    oxyCODONE-acetaminophen (PERCOCET) 5-325 MG tablet  Every 4 hours PRN     03/26/17 1756    ondansetron (ZOFRAN) 4 MG tablet  Every 6 hours     03/26/17 1756       Donnetta Hutching, MD 03/26/17 1759

## 2017-03-26 NOTE — ED Notes (Signed)
Pt had own sling

## 2017-03-26 NOTE — Discharge Instructions (Signed)
You have broken your humerus bone.  Immobilizer and sling applied in the emergency department.  Ice pack.  Pain medication.  Call Dr. Romeo Apple in the morning for a follow-up appointment.  Phone number given.

## 2017-03-26 NOTE — ED Notes (Signed)
Dilaudid given by Jerald Kief RN

## 2017-03-26 NOTE — ED Triage Notes (Signed)
Rt arm pain after tripping and falling on that side.  Pt reports she heard a popping sound during fall

## 2017-03-27 ENCOUNTER — Telehealth: Payer: Self-pay | Admitting: Rheumatology

## 2017-03-27 ENCOUNTER — Ambulatory Visit: Payer: Medicare Other | Admitting: Rheumatology

## 2017-03-27 ENCOUNTER — Encounter (INDEPENDENT_AMBULATORY_CARE_PROVIDER_SITE_OTHER): Payer: Self-pay | Admitting: Orthopedic Surgery

## 2017-03-27 ENCOUNTER — Other Ambulatory Visit (INDEPENDENT_AMBULATORY_CARE_PROVIDER_SITE_OTHER): Payer: Self-pay | Admitting: Family

## 2017-03-27 ENCOUNTER — Ambulatory Visit (INDEPENDENT_AMBULATORY_CARE_PROVIDER_SITE_OTHER): Payer: Medicare Other | Admitting: Orthopedic Surgery

## 2017-03-27 VITALS — Ht 62.0 in | Wt 189.0 lb

## 2017-03-27 DIAGNOSIS — S42331A Displaced oblique fracture of shaft of humerus, right arm, initial encounter for closed fracture: Secondary | ICD-10-CM | POA: Insufficient documentation

## 2017-03-27 MED ORDER — SULFASALAZINE 500 MG PO TBEC: DELAYED_RELEASE_TABLET | ORAL | 0 refills | Status: DC

## 2017-03-27 NOTE — Telephone Encounter (Signed)
Patient called to request a prescription refill of her Sulfasalazine.  Patient uses [facility] in [place].  Patient states she had her labwork done in January.  Patient had to cancel her appointment today due to falling yesterday and breaking her arm and possibly needing surgery.  She will call back to reschedule after she talks to the orthopedic surgeon.

## 2017-03-27 NOTE — Telephone Encounter (Signed)
Last Visit: 10/23/16 Next Visit due now. Had to cancel due to breaking her arm and needing to see orthopedic surgeon and possible surgery.  Labs: 02/12/17 Elevated glucose, elevated Creat. And decreased GFR   Okay to refill per Dr. Corliss Skains

## 2017-03-27 NOTE — Progress Notes (Signed)
Office Visit Note   Patient: Margaret [name]           Date of Birth: 07-06-1950           MRN: [id number] Visit Date: 03/27/2017              Requested by: Kirstie Peri, MD [address] [place], [place] [address] PCP: Kirstie Peri, MD  Chief Complaint  Patient presents with  . Right Shoulder - Pain, Fracture    Right humeral fracture.       HPI: Patient is a 67 year old woman who states that she fell yesterday sustaining a spiral fracture midshaft right humerus.  She does state he tripped over a dog bed.  She is currently in a splint she states that her arm is killing her she is using Percocet without relief.  Past medical history is updated she is diabetic she states her hemoglobin A1c is around 6.  She states she does smoke and smoke through her lumbar spine fusion surgery she also has rheumatoid arthritis and bipolar disease.  Assessment & Plan: Visit Diagnoses:  1. Closed displaced oblique fracture of shaft of right humerus, initial encounter     Plan: Discussed operative versus nonoperative treatment.  Patient states she would like to proceed with surgery discussed that she is an increased risk of infection neurovascular injury radial nerve palsy need for additional surgery.  Patient states she understands wished to proceed at this time plan for outpatient surgery on Friday.  Follow-Up Instructions: Return in about 2 weeks (around 04/10/2017).   Ortho Exam  Patient is alert, oriented, no adenopathy, well-dressed, normal affect, normal respiratory effort. Examination patient is currently in a's point for the right upper extremity.  Her median radial and ulnar nerve are intact in the right hand she has good grip strength she has good pulses.  Review of the radiographs shows a displaced short oblique fracture midshaft right humerus.  Imaging: No results found. No images are attached to the encounter.  Labs: Lab Results  Component Value Date   HGBA1C 6.7 (H) 01/20/2015     @LABSALLVALUES (HGBA1)@  Body mass index is 34.57 kg/m.  Orders:  No orders of the defined types were placed in this encounter.  No orders of the defined types were placed in this encounter.    Procedures: No procedures performed  Clinical Data: No additional findings.  ROS:  All other systems negative, except as noted in the HPI. Review of Systems  Objective: Vital Signs: Ht 5\' 2"  (1.575 m)   Wt 189 lb (85.7 kg)   BMI 34.57 kg/m   Specialty Comments:  No specialty comments available.  PMFS History: Patient Active Problem List   Diagnosis Date Noted  . Closed displaced oblique fracture of shaft of right humerus 03/27/2017  . Smoker 01/09/2016  . High risk medication use 01/06/2016  . RA (rheumatoid arthritis) (HCC) 01/05/2016  . DDD (degenerative disc disease), lumbar 01/05/2016  . Calcaneal spur 01/05/2016  . Bipolar disorder (HCC) 01/05/2016  . ADD (attention deficit disorder) 01/05/2016  . Sleep apnea 01/05/2016  . TMJ (dislocation of temporomandibular joint) 01/05/2016  . Pedal edema 01/05/2016  . Osteoarthritis of both hands 01/05/2016  . Osteoarthritis of both feet 01/05/2016  . Osteoarthritis of both knees 01/05/2016  . GERD (gastroesophageal reflux disease) 06/24/2015  . Constipation 06/24/2015  . Spondylolisthesis at L4-L5 level 01/24/2015  . 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 CONTRACTURE OF SHOULDER JOINT 04/24/2007  . DIABETES 07/30/2006   Past Medical History:  Diagnosis Date  . ADD (attention deficit disorder) 01/05/2016  . Arthritis   . Asthma   . Benign essential hypertension   . Bipolar disorder [facility])    hospitalized at [facility] for manic episodes  . Calcaneal spur 01/05/2016  . DDD (degenerative disc disease), lumbar 01/05/2016  . Diabetes mellitus without complication (HCC)   . Diabetic neuropathy (HCC)   . Dysrhythmia    takes diltiazem prescribed by medical doctor; has never seen cardiologist  . Eczema   . Gastroesophageal  reflux disease   . Hyperlipidemia   . Hypersomnia   . IBS (irritable bowel syndrome)   . Myalgia   . Noncompliance with CPAP treatment   . Osteoarthritis of both feet 01/05/2016  . Osteoarthritis of both hands 01/05/2016  . Osteoarthritis of both knees 01/05/2016  . Pedal edema 01/05/2016  . Peripheral vascular disease (HCC)   . RA (rheumatoid arthritis) (HCC) 01/05/2016   +RF, +CCP, Nodulous,   . Rheumatoid arthritis (HCC)   . Sleep apnea 2015   home sleep study   . TMJ (dislocation of temporomandibular joint) 01/05/2016  . Varicose veins     Family History  Problem Relation Age of Onset  . Hypertension Father   . AAA (abdominal aortic aneurysm) Brother   . Colon cancer Neg Hx   . Colon polyps Neg Hx     Past Surgical History:  Procedure Laterality Date  . BACK SURGERY    . CESAREAN SECTION    . COLONOSCOPY  2011   Dr. Loreta Ave in [place]  . ELBOW SURGERY    . ENDOVENOUS ABLATION SAPHENOUS VEIN W/ LASER     Social History   Occupational History  . Not on file  Tobacco Use  . Smoking status: Current Every Day Smoker    Packs/day: 1.00    Years: 40.00    Pack years: 40.00    Types: Cigarettes  . Smokeless tobacco: Never Used  Substance and Sexual Activity  . Alcohol use: No    Alcohol/week: 0.0 oz  . Drug use: No  . Sexual activity: Not on file

## 2017-03-28 ENCOUNTER — Encounter (HOSPITAL_COMMUNITY): Payer: Self-pay | Admitting: *Deleted

## 2017-03-28 ENCOUNTER — Other Ambulatory Visit: Payer: Self-pay

## 2017-03-28 NOTE — Telephone Encounter (Signed)
Patient called stating that she got a call from [facility] stating that her 30 day prescription of Sulfasalazine was ready to be picked up.  Patient states that it is less expensive with her insurance plan if a 90 day supply is called into the pharmacy.  Patient states the only reason she missed her appointment to see Dr. Corliss Skains is because she broke her arm which she is having surgery tomorrow with Dr. Lajoyce Corners.

## 2017-03-28 NOTE — Progress Notes (Signed)
Pt denies SOB, chest pain, and being under the care of a cardiologist. Pt denies having a cardiac cath, not sure if an echo was performed but stated that a stress test was done; requested records from PCP, Dr. Sherryll Burger of [facility]. Pt stated that an EKG was also performed; requested from PCP as well. Pt denies having a chest x ray within the last year and recent labs. Pt made aware to stop taking vitamins, fish oil and herbal medications (Ginger, Glucosamine, Tumeric, Flaxseed, Tart Cherry). Do not take any NSAIDs ie: Ibuprofen, Advil, Naproxen ( Aleve), Motrin, BC and Goody Powder. Pt made aware to not take Metformin and Glimepiride on DOS. Pt made aware to check BG every 2 hours prior to arrival to hospital on DOS. Pt made aware to treat a BG < 70 with  4 ounces of cranberry juice, wait 15 minutes after intervention to recheck BG, if BG remains < 70, call Short Stay unit to speak with a nurse. Pt verbalized understanding of all pre-op instructions.

## 2017-03-29 ENCOUNTER — Ambulatory Visit (HOSPITAL_COMMUNITY): Payer: Medicare Other | Admitting: Anesthesiology

## 2017-03-29 ENCOUNTER — Encounter (HOSPITAL_COMMUNITY): Payer: Self-pay | Admitting: *Deleted

## 2017-03-29 ENCOUNTER — Ambulatory Visit (HOSPITAL_COMMUNITY)
Admission: RE | Admit: 2017-03-29 | Discharge: 2017-03-29 | Disposition: A | Discharge status: Home / Self Care | Payer: Medicare Other | Source: Ambulatory Visit | Attending: Orthopedic Surgery | Admitting: Orthopedic Surgery

## 2017-03-29 ENCOUNTER — Encounter (HOSPITAL_COMMUNITY)
Admission: RE | Disposition: A | Discharge status: Home / Self Care | Payer: Self-pay | Source: Ambulatory Visit | Attending: Orthopedic Surgery

## 2017-03-29 DIAGNOSIS — S42301A Unspecified fracture of shaft of humerus, right arm, initial encounter for closed fracture: Secondary | ICD-10-CM | POA: Diagnosis present

## 2017-03-29 DIAGNOSIS — M170 Bilateral primary osteoarthritis of knee: Secondary | ICD-10-CM | POA: Diagnosis not present

## 2017-03-29 DIAGNOSIS — Z885 Allergy status to narcotic agent status: Secondary | ICD-10-CM | POA: Diagnosis not present

## 2017-03-29 DIAGNOSIS — F319 Bipolar disorder, unspecified: Secondary | ICD-10-CM | POA: Diagnosis not present

## 2017-03-29 DIAGNOSIS — M791 Myalgia, unspecified site: Secondary | ICD-10-CM | POA: Insufficient documentation

## 2017-03-29 DIAGNOSIS — K219 Gastro-esophageal reflux disease without esophagitis: Secondary | ICD-10-CM | POA: Insufficient documentation

## 2017-03-29 DIAGNOSIS — Y939 Activity, unspecified: Secondary | ICD-10-CM | POA: Diagnosis not present

## 2017-03-29 DIAGNOSIS — I8390 Asymptomatic varicose veins of unspecified lower extremity: Secondary | ICD-10-CM | POA: Insufficient documentation

## 2017-03-29 DIAGNOSIS — G4710 Hypersomnia, unspecified: Secondary | ICD-10-CM | POA: Diagnosis not present

## 2017-03-29 DIAGNOSIS — Z888 Allergy status to other drugs, medicaments and biological substances status: Secondary | ICD-10-CM | POA: Diagnosis not present

## 2017-03-29 DIAGNOSIS — M5136 Other intervertebral disc degeneration, lumbar region: Secondary | ICD-10-CM | POA: Diagnosis not present

## 2017-03-29 DIAGNOSIS — K589 Irritable bowel syndrome without diarrhea: Secondary | ICD-10-CM | POA: Insufficient documentation

## 2017-03-29 DIAGNOSIS — M19041 Primary osteoarthritis, right hand: Secondary | ICD-10-CM | POA: Insufficient documentation

## 2017-03-29 DIAGNOSIS — S42331A Displaced oblique fracture of shaft of humerus, right arm, initial encounter for closed fracture: Secondary | ICD-10-CM | POA: Insufficient documentation

## 2017-03-29 DIAGNOSIS — F988 Other specified behavioral and emotional disorders with onset usually occurring in childhood and adolescence: Secondary | ICD-10-CM | POA: Insufficient documentation

## 2017-03-29 DIAGNOSIS — I10 Essential (primary) hypertension: Secondary | ICD-10-CM | POA: Diagnosis not present

## 2017-03-29 DIAGNOSIS — M19072 Primary osteoarthritis, left ankle and foot: Secondary | ICD-10-CM | POA: Insufficient documentation

## 2017-03-29 DIAGNOSIS — Z6834 Body mass index (BMI) 34.0-34.9, adult: Secondary | ICD-10-CM | POA: Insufficient documentation

## 2017-03-29 DIAGNOSIS — M19071 Primary osteoarthritis, right ankle and foot: Secondary | ICD-10-CM | POA: Insufficient documentation

## 2017-03-29 DIAGNOSIS — E785 Hyperlipidemia, unspecified: Secondary | ICD-10-CM | POA: Insufficient documentation

## 2017-03-29 DIAGNOSIS — M069 Rheumatoid arthritis, unspecified: Secondary | ICD-10-CM | POA: Insufficient documentation

## 2017-03-29 DIAGNOSIS — W19XXXA Unspecified fall, initial encounter: Secondary | ICD-10-CM | POA: Diagnosis not present

## 2017-03-29 DIAGNOSIS — I499 Cardiac arrhythmia, unspecified: Secondary | ICD-10-CM | POA: Diagnosis not present

## 2017-03-29 DIAGNOSIS — E669 Obesity, unspecified: Secondary | ICD-10-CM | POA: Insufficient documentation

## 2017-03-29 DIAGNOSIS — E1140 Type 2 diabetes mellitus with diabetic neuropathy, unspecified: Secondary | ICD-10-CM | POA: Insufficient documentation

## 2017-03-29 DIAGNOSIS — M26609 Unspecified temporomandibular joint disorder, unspecified side: Secondary | ICD-10-CM | POA: Insufficient documentation

## 2017-03-29 DIAGNOSIS — G4730 Sleep apnea, unspecified: Secondary | ICD-10-CM | POA: Insufficient documentation

## 2017-03-29 DIAGNOSIS — Z8249 Family history of ischemic heart disease and other diseases of the circulatory system: Secondary | ICD-10-CM | POA: Diagnosis not present

## 2017-03-29 DIAGNOSIS — J45909 Unspecified asthma, uncomplicated: Secondary | ICD-10-CM | POA: Insufficient documentation

## 2017-03-29 DIAGNOSIS — M19042 Primary osteoarthritis, left hand: Secondary | ICD-10-CM | POA: Diagnosis not present

## 2017-03-29 DIAGNOSIS — E1151 Type 2 diabetes mellitus with diabetic peripheral angiopathy without gangrene: Secondary | ICD-10-CM | POA: Insufficient documentation

## 2017-03-29 DIAGNOSIS — F17210 Nicotine dependence, cigarettes, uncomplicated: Secondary | ICD-10-CM | POA: Insufficient documentation

## 2017-03-29 DIAGNOSIS — S42331D Displaced oblique fracture of shaft of humerus, right arm, subsequent encounter for fracture with routine healing: Secondary | ICD-10-CM

## 2017-03-29 HISTORY — PX: ORIF HUMERUS FRACTURE: SHX2126

## 2017-03-29 HISTORY — DX: Unspecified fracture of shaft of humerus, unspecified arm, initial encounter for closed fracture: S42.309A

## 2017-03-29 LAB — CBC
HCT: 36.2 % (ref 36.0–46.0)
HEMOGLOBIN: 11.3 g/dL — AB (ref 12.0–15.0)
MCH: 28.3 pg (ref 26.0–34.0)
MCHC: 31.2 g/dL (ref 30.0–36.0)
MCV: 90.7 fL (ref 78.0–100.0)
PLATELETS: 289 10*3/uL (ref 150–400)
RBC: 3.99 MIL/uL (ref 3.87–5.11)
RDW: 15.5 % (ref 11.5–15.5)
WBC: 10.4 10*3/uL (ref 4.0–10.5)

## 2017-03-29 LAB — BASIC METABOLIC PANEL
Anion gap: 10 (ref 5–15)
BUN: 10 mg/dL (ref 6–20)
CHLORIDE: 109 mmol/L (ref 101–111)
CO2: 19 mmol/L — ABNORMAL LOW (ref 22–32)
Calcium: 8.8 mg/dL — ABNORMAL LOW (ref 8.9–10.3)
Creatinine, Ser: 0.95 mg/dL (ref 0.44–1.00)
GFR calc non Af Amer: >60 mL/min (ref >60)
Glucose, Bld: 124 mg/dL — ABNORMAL HIGH (ref 65–99)
POTASSIUM: 4.4 mmol/L (ref 3.5–5.1)
SODIUM: 138 mmol/L (ref 135–145)

## 2017-03-29 LAB — GLUCOSE, CAPILLARY
GLUCOSE-CAPILLARY: 123 mg/dL — AB (ref 65–99)
Glucose-Capillary: 121 mg/dL — ABNORMAL HIGH (ref 65–99)

## 2017-03-29 SURGERY — OPEN REDUCTION INTERNAL FIXATION (ORIF) HUMERAL SHAFT FRACTURE
Anesthesia: General | Laterality: Right

## 2017-03-29 MED ORDER — ROPIVACAINE HCL 7.5 MG/ML IJ SOLN
INTRAMUSCULAR | Status: DC | PRN
  Administered 2017-03-29 (×4): 5 mL via PERINEURAL

## 2017-03-29 MED ORDER — SUGAMMADEX SODIUM 200 MG/2ML IV SOLN
INTRAVENOUS | Status: DC | PRN
  Administered 2017-03-29: 180 mg via INTRAVENOUS

## 2017-03-29 MED ORDER — ROCURONIUM BROMIDE 10 MG/ML (PF) SYRINGE
PREFILLED_SYRINGE | INTRAVENOUS | Status: AC
  Filled 2017-03-29: qty 5

## 2017-03-29 MED ORDER — ONDANSETRON HCL 4 MG/2ML IJ SOLN
INTRAMUSCULAR | Status: DC | PRN
  Administered 2017-03-29: 4 mg via INTRAVENOUS

## 2017-03-29 MED ORDER — 0.9 % SODIUM CHLORIDE (POUR BTL) OPTIME
TOPICAL | Status: DC | PRN
  Administered 2017-03-29: 1000 mL

## 2017-03-29 MED ORDER — CEFAZOLIN SODIUM-DEXTROSE 2-4 GM/100ML-% IV SOLN
2.0000 g | INTRAVENOUS | Status: DC
  Filled 2017-03-29: qty 100

## 2017-03-29 MED ORDER — CHLORHEXIDINE GLUCONATE 4 % EX LIQD: 60.0000 mL | Freq: Once | CUTANEOUS | Status: DC

## 2017-03-29 MED ORDER — FENTANYL CITRATE (PF) 250 MCG/5ML IJ SOLN
INTRAMUSCULAR | Status: DC | PRN
  Administered 2017-03-29 (×2): 50 ug via INTRAVENOUS

## 2017-03-29 MED ORDER — FENTANYL CITRATE (PF) 100 MCG/2ML IJ SOLN
50.0000 ug | INTRAMUSCULAR | Status: AC | PRN
  Administered 2017-03-29 (×3): 50 ug via INTRAVENOUS

## 2017-03-29 MED ORDER — FENTANYL CITRATE (PF) 100 MCG/2ML IJ SOLN
50.0000 ug | Freq: Once | INTRAMUSCULAR | Status: AC
  Administered 2017-03-29: 50 ug via INTRAVENOUS

## 2017-03-29 MED ORDER — DEXAMETHASONE SODIUM PHOSPHATE 10 MG/ML IJ SOLN
INTRAMUSCULAR | Status: DC | PRN
  Administered 2017-03-29: 4 mg via INTRAVENOUS

## 2017-03-29 MED ORDER — PHENYLEPHRINE 40 MCG/ML (10ML) SYRINGE FOR IV PUSH (FOR BLOOD PRESSURE SUPPORT)
PREFILLED_SYRINGE | INTRAVENOUS | Status: AC
  Filled 2017-03-29: qty 10

## 2017-03-29 MED ORDER — MIDAZOLAM HCL 2 MG/2ML IJ SOLN
1.0000 mg | INTRAMUSCULAR | Status: DC | PRN
  Administered 2017-03-29: 1 mg via INTRAVENOUS

## 2017-03-29 MED ORDER — ROCURONIUM BROMIDE 10 MG/ML (PF) SYRINGE
PREFILLED_SYRINGE | INTRAVENOUS | Status: DC | PRN
  Administered 2017-03-29: 50 mg via INTRAVENOUS

## 2017-03-29 MED ORDER — OXYCODONE-ACETAMINOPHEN 5-325 MG PO TABS
1.0000 | ORAL_TABLET | Freq: Once | ORAL | Status: AC
  Administered 2017-03-29: 1 via ORAL

## 2017-03-29 MED ORDER — LACTATED RINGERS IV SOLN
INTRAVENOUS | Status: DC
  Administered 2017-03-29 (×2): via INTRAVENOUS

## 2017-03-29 MED ORDER — OXYCODONE-ACETAMINOPHEN 5-325 MG PO TABS: 1.0000 | ORAL_TABLET | ORAL | 0 refills | Status: DC | PRN

## 2017-03-29 MED ORDER — PROPOFOL 10 MG/ML IV BOLUS
INTRAVENOUS | Status: AC
  Filled 2017-03-29: qty 20

## 2017-03-29 MED ORDER — MIDAZOLAM HCL 2 MG/2ML IJ SOLN
INTRAMUSCULAR | Status: AC
  Administered 2017-03-29: 1 mg via INTRAVENOUS
  Filled 2017-03-29: qty 2

## 2017-03-29 MED ORDER — FENTANYL CITRATE (PF) 100 MCG/2ML IJ SOLN
INTRAMUSCULAR | Status: AC
  Administered 2017-03-29: 50 ug via INTRAVENOUS
  Filled 2017-03-29: qty 2

## 2017-03-29 MED ORDER — PHENYLEPHRINE 40 MCG/ML (10ML) SYRINGE FOR IV PUSH (FOR BLOOD PRESSURE SUPPORT)
PREFILLED_SYRINGE | INTRAVENOUS | Status: DC | PRN
  Administered 2017-03-29: 80 ug via INTRAVENOUS

## 2017-03-29 MED ORDER — FENTANYL CITRATE (PF) 250 MCG/5ML IJ SOLN
INTRAMUSCULAR | Status: AC
  Filled 2017-03-29: qty 5

## 2017-03-29 MED ORDER — PHENYLEPHRINE HCL 10 MG/ML IJ SOLN
INTRAVENOUS | Status: DC | PRN
  Administered 2017-03-29: 20 ug/min via INTRAVENOUS

## 2017-03-29 MED ORDER — DEXAMETHASONE SODIUM PHOSPHATE 10 MG/ML IJ SOLN
INTRAMUSCULAR | Status: AC
  Filled 2017-03-29: qty 2

## 2017-03-29 MED ORDER — OXYCODONE-ACETAMINOPHEN 5-325 MG PO TABS
ORAL_TABLET | ORAL | Status: AC
  Filled 2017-03-29: qty 1

## 2017-03-29 MED ORDER — PROPOFOL 10 MG/ML IV BOLUS
INTRAVENOUS | Status: DC | PRN
  Administered 2017-03-29: 200 mg via INTRAVENOUS

## 2017-03-29 MED ORDER — LIDOCAINE 2% (20 MG/ML) 5 ML SYRINGE
INTRAMUSCULAR | Status: DC | PRN
  Administered 2017-03-29: 100 mg via INTRAVENOUS

## 2017-03-29 MED ORDER — ONDANSETRON HCL 4 MG/2ML IJ SOLN
INTRAMUSCULAR | Status: AC
  Filled 2017-03-29: qty 2

## 2017-03-29 SURGICAL SUPPLY — 59 items
BIT DRILL 2.5X110 QC LCP DISP (BIT) ×3 IMPLANT
BNDG COHESIVE 4X5 TAN STRL (GAUZE/BANDAGES/DRESSINGS) ×3 IMPLANT
BNDG ESMARK 4X9 LF (GAUZE/BANDAGES/DRESSINGS) ×3 IMPLANT
BNDG GAUZE ELAST 4 BULKY (GAUZE/BANDAGES/DRESSINGS) ×3 IMPLANT
CLOSURE WOUND 1/2 X4 (GAUZE/BANDAGES/DRESSINGS) ×1
COVER SURGICAL LIGHT HANDLE (MISCELLANEOUS) ×3 IMPLANT
CUFF TOURNIQUET SINGLE 18IN (TOURNIQUET CUFF) ×3 IMPLANT
CUFF TOURNIQUET SINGLE 24IN (TOURNIQUET CUFF) IMPLANT
DRAPE IMP U-DRAPE 54X76 (DRAPES) ×3 IMPLANT
DRAPE OEC MINIVIEW 54X84 (DRAPES) ×3 IMPLANT
DRAPE U-SHAPE 47X51 STRL (DRAPES) ×3 IMPLANT
DRSG ADAPTIC 3X8 NADH LF (GAUZE/BANDAGES/DRESSINGS) ×3 IMPLANT
DRSG EMULSION OIL 3X3 NADH (GAUZE/BANDAGES/DRESSINGS) IMPLANT
DRSG PAD ABDOMINAL 8X10 ST (GAUZE/BANDAGES/DRESSINGS) IMPLANT
DURAPREP 26ML APPLICATOR (WOUND CARE) ×3 IMPLANT
ELECT REM PT RETURN 9FT ADLT (ELECTROSURGICAL) ×3
ELECTRODE REM PT RTRN 9FT ADLT (ELECTROSURGICAL) ×1 IMPLANT
GAUZE SPONGE 4X4 12PLY STRL (GAUZE/BANDAGES/DRESSINGS) IMPLANT
GAUZE SPONGE 4X4 12PLY STRL LF (GAUZE/BANDAGES/DRESSINGS) ×3 IMPLANT
GAUZE SPONGE 4X4 16PLY XRAY LF (GAUZE/BANDAGES/DRESSINGS) IMPLANT
GLOVE BIOGEL PI IND STRL 9 (GLOVE) ×1 IMPLANT
GLOVE BIOGEL PI INDICATOR 9 (GLOVE) ×2
GLOVE SURG ORTHO 9.0 STRL STRW (GLOVE) ×3 IMPLANT
GOWN STRL REUS W/ TWL XL LVL3 (GOWN DISPOSABLE) ×2 IMPLANT
GOWN STRL REUS W/TWL XL LVL3 (GOWN DISPOSABLE) ×4
KIT BASIN OR (CUSTOM PROCEDURE TRAY) ×3 IMPLANT
KIT ROOM TURNOVER OR (KITS) ×3 IMPLANT
MANIFOLD NEPTUNE II (INSTRUMENTS) IMPLANT
NS IRRIG 1000ML POUR BTL (IV SOLUTION) ×3 IMPLANT
PACK ORTHO EXTREMITY (CUSTOM PROCEDURE TRAY) ×3 IMPLANT
PACK UNIVERSAL I (CUSTOM PROCEDURE TRAY) IMPLANT
PAD ABD 8X10 STRL (GAUZE/BANDAGES/DRESSINGS) ×3 IMPLANT
PAD ARMBOARD 7.5X6 YLW CONV (MISCELLANEOUS) ×3 IMPLANT
PAD CAST 4YDX4 CTTN HI CHSV (CAST SUPPLIES) IMPLANT
PADDING CAST COTTON 4X4 STRL (CAST SUPPLIES)
PROS LCP PLATE 9H 124M (Plate) ×3 IMPLANT
PROSTHESIS LCP PLATE 9H 124M (Plate) ×1 IMPLANT
SCREW CORTEX 3.5 22MM (Screw) ×2 IMPLANT
SCREW CORTEX 3.5 24MM (Screw) ×2 IMPLANT
SCREW CORTEX 3.5 26MM (Screw) ×2 IMPLANT
SCREW LOCK CORT ST 3.5X22 (Screw) ×1 IMPLANT
SCREW LOCK CORT ST 3.5X24 (Screw) ×1 IMPLANT
SCREW LOCK CORT ST 3.5X26 (Screw) ×1 IMPLANT
SLING ARM IMMOBILIZER LRG (SOFTGOODS) ×3 IMPLANT
SPONGE LAP 18X18 X RAY DECT (DISPOSABLE) ×3 IMPLANT
STAPLER VISISTAT 35W (STAPLE) ×3 IMPLANT
STRIP CLOSURE SKIN 1/2X4 (GAUZE/BANDAGES/DRESSINGS) ×2 IMPLANT
SUCTION FRAZIER HANDLE 10FR (MISCELLANEOUS) ×2
SUCTION TUBE FRAZIER 10FR DISP (MISCELLANEOUS) ×1 IMPLANT
SUT PROLENE 3 0 PS 1 (SUTURE) IMPLANT
SUT VIC AB 2-0 CTB1 (SUTURE) IMPLANT
SUT VIC AB 3-0 X1 27 (SUTURE) ×3 IMPLANT
SYR BULB 3OZ (MISCELLANEOUS) IMPLANT
TOWEL OR 17X24 6PK STRL BLUE (TOWEL DISPOSABLE) ×3 IMPLANT
TOWEL OR 17X26 10 PK STRL BLUE (TOWEL DISPOSABLE) ×3 IMPLANT
TUBE CONNECTING 12'X1/4 (SUCTIONS) ×1
TUBE CONNECTING 12X1/4 (SUCTIONS) ×2 IMPLANT
WATER STERILE IRR 1000ML POUR (IV SOLUTION) IMPLANT
YANKAUER SUCT BULB TIP NO VENT (SUCTIONS) ×3 IMPLANT

## 2017-03-29 NOTE — Anesthesia Procedure Notes (Addendum)
Anesthesia Regional Block: Supraclavicular block   Pre-Anesthetic Checklist: ,, timeout performed, Correct Patient, Correct Site, Correct Laterality, Correct Procedure, Correct Position, site marked, Risks and benefits discussed,  Surgical consent,  Pre-op evaluation,  At surgeon's request and post-op pain management  Laterality: Right and Upper  Prep: chloraprep       Needles:  Injection technique: Single-shot  Needle Type: Echogenic Stimulator Needle     Needle Length: 9cm  Needle Gauge: 21   Needle insertion depth: 2.5 cm   Additional Needles:   Procedures:,,,, ultrasound used (permanent image in chart),,,,  Narrative:  Start time: 03/29/2017 12:46 PM End time: 03/29/2017 12:56 PM Injection made incrementally with aspirations every 5 mL.  Performed by: Personally  Anesthesiologist: Leilani Able, MD

## 2017-03-29 NOTE — Anesthesia Procedure Notes (Signed)
Procedure Name: Intubation Date/Time: 03/29/2017 1:21 PM Performed by: Adria Dill, CRNA Pre-anesthesia Checklist: Patient identified, Emergency Drugs available, Suction available and Patient being monitored Patient Re-evaluated:Patient Re-evaluated prior to induction Oxygen Delivery Method: Circle system utilized Preoxygenation: Pre-oxygenation with 100% oxygen Induction Type: IV induction Ventilation: Mask ventilation without difficulty Laryngoscope Size: Miller and 2 Grade View: Grade I Tube type: Oral Tube size: 7.0 mm Number of attempts: 1 Airway Equipment and Method: Stylet Placement Confirmation: ETT inserted through vocal cords under direct vision,  positive ETCO2,  CO2 detector and breath sounds checked- equal and bilateral Secured at: 21 cm Tube secured with: Tape Dental Injury: Teeth and Oropharynx as per pre-operative assessment

## 2017-03-29 NOTE — Transfer of Care (Signed)
Immediate Anesthesia Transfer of Care Note  Patient: Margaret [name]  Procedure(s) Performed: OPEN REDUCTION INTERNAL FIXATION (ORIF) RIGHT HUMERAL SHAFT FRACTURE (Right )  Patient Location: PACU  Anesthesia Type:GA combined with regional for post-op pain  Level of Consciousness: awake, sedated and patient cooperative  Airway & Oxygen Therapy: Patient Spontanous Breathing and Patient connected to nasal cannula oxygen  Post-op Assessment: Report given to RN and Post -op Vital signs reviewed and stable  Post vital signs: Reviewed and stable  Last Vitals:  Vitals:   03/29/17 1135  BP: (!) 123/59  Pulse: 94  Resp: 18  Temp: 37.2 C  SpO2: 98%    Last Pain:  Vitals:   03/29/17 1135  TempSrc: Oral  PainSc:       Patients Stated Pain Goal: 7 (03/29/17 1132)  Complications: No apparent anesthesia complications

## 2017-03-29 NOTE — OR Nursing (Signed)
Discharge instructions were review with pt and pt husband. Pt c/o incision pain at top of arm. Pt was able to pull down dressing and open incision noted. Dr. Lajoyce Corners aware and new order to place mepilex dressing to site. Pt stated that site was much more comfortable with new dressing. Pt and family denied any questions regarding d/c teaching.

## 2017-03-29 NOTE — Op Note (Signed)
03/29/2017  2:10 PM  PATIENT:  Margaret [name]    PRE-OPERATIVE DIAGNOSIS:  right humeral shaft fracture  POST-OPERATIVE DIAGNOSIS:  Same  PROCEDURE:  OPEN REDUCTION INTERNAL FIXATION (ORIF) RIGHT HUMERAL SHAFT FRACTURE  SURGEON:  Nadara Mustard, MD  PHYSICIAN ASSISTANT:None ANESTHESIA:   General  PREOPERATIVE INDICATIONS:  Margaret [name] is a  67 y.o. female with a diagnosis of right humeral shaft fracture who failed conservative measures and elected for surgical management.    The risks benefits and alternatives were discussed with the patient preoperatively including but not limited to the risks of infection, bleeding, nerve injury, cardiopulmonary complications, the need for revision surgery, among others, and the patient was willing to proceed.  OPERATIVE IMPLANTS: 9 hole small frag plate  @ENCIMAGES @  OPERATIVE FINDINGS: Oblique fracture with thin cortical bone  OPERATIVE PROCEDURE: Patient was brought to the operating room and underwent a general anesthetic.  After adequate levels of anesthesia were obtained patient's right upper extremity was prepped using DuraPrep draped in the sterile field a timeout was called.  An incision was made anteriorly in line with the biceps tendon proximally and distally.  Blunt dissection was carried down to the biceps muscle this was retracted medially and the brachialis was incised midline down to the fracture.  The fracture had perforated the muscle.  Retractors were placed to protect the radial nerve.  The fracture site was cleansed reduced and stabilized with an interfrag screw.  A 9 hole plate was placed anteriorly this was secured with 3 compression screws proximally and 3 compression screws distally.  The wound was irrigated with normal saline C-arm fluoroscopy verified the alignment.  The wound was again irrigated the skin was closed using 2-0 nylon.  The skin was very thin and atrophic with a very thick layer of adipose tissue very thin and  small muscle mass.  A sterile compressive dressing was applied patient was extubated taken the PACU in stable condition.   DISCHARGE PLANNING:  Antibiotic duration: Perioperatively antibiotics  Weightbearing: Nonweightbearing right upper extremity   Pain medication: Percocet  Dressing care/ Wound VAC: Keep dressing dry until follow-up.  Ambulatory devices: Sling for the right upper extremity.    Discharge to: Discharge to home.  Follow-up: In the office 1 week post operative.

## 2017-03-29 NOTE — H&P (Signed)
Margaret [name] is an 68 y.o. female.   Chief Complaint: Right midshaft humerus fracture HPI: Patient is a 67 year old woman who states that she fell yesterday sustaining a spiral fracture midshaft right humerus.  She does state he tripped over a dog bed.  She is currently in a splint she states that her arm is killing her she is using Percocet without relief.  Past medical history is updated she is diabetic she states her hemoglobin A1c is around 6.  She states she does smoke and smoke through her lumbar spine fusion surgery she also has rheumatoid arthritis and bipolar disease.    Past Medical History:  Diagnosis Date  . ADD (attention deficit disorder) 01/05/2016  . Arthritis   . Asthma   . Benign essential hypertension   . Bipolar disorder [facility])    hospitalized at [facility] for manic episodes  . Calcaneal spur 01/05/2016  . DDD (degenerative disc disease), lumbar 01/05/2016  . Diabetes mellitus without complication (HCC)   . Diabetic neuropathy (HCC)   . Dysrhythmia    takes diltiazem prescribed by medical doctor; has never seen cardiologist  . Eczema   . Gastroesophageal reflux disease   . Humeral shaft fracture    right  . Hyperlipidemia   . Hypersomnia   . IBS (irritable bowel syndrome)   . Myalgia   . Noncompliance with CPAP treatment   . Osteoarthritis of both feet 01/05/2016  . Osteoarthritis of both hands 01/05/2016  . Osteoarthritis of both knees 01/05/2016  . Pedal edema 01/05/2016  . Peripheral vascular disease (HCC)   . RA (rheumatoid arthritis) (HCC) 01/05/2016   +RF, +CCP, Nodulous,   . Rheumatoid arthritis (HCC)   . Sleep apnea 2015   home sleep study   . TMJ (dislocation of temporomandibular joint) 01/05/2016  . Varicose veins     Past Surgical History:  Procedure Laterality Date  . BACK SURGERY    . CESAREAN SECTION    . COLONOSCOPY  2011   Dr. Loreta Ave in [place]  . ELBOW SURGERY    . ENDOVENOUS ABLATION SAPHENOUS VEIN W/ LASER      Family  History  Problem Relation Age of Onset  . Hypertension Father   . AAA (abdominal aortic aneurysm) Brother   . Colon cancer Neg Hx   . Colon polyps Neg Hx    Social History:  reports that she has been smoking cigarettes.  She has a 40.00 pack-year smoking history. she has never used smokeless tobacco. She reports that she does not drink alcohol or use drugs.  Allergies:  Allergies  Allergen Reactions  . Fluoxetine Hcl (Pmdd) Other (See Comments)    mania  . Hydroxychloroquine Rash  . Vicodin [Hydrocodone-Acetaminophen] Nausea Only    No medications prior to admission.    No results found for this or any previous visit (from the past 48 hour(s)). No results found.  Review of Systems  All other systems reviewed and are negative.   There were no vitals taken for this visit. Physical Exam  Patient is alert, oriented, no adenopathy, well-dressed, normal affect, normal respiratory effort. Examination patient is currently in a's point for the right upper extremity.  Her median radial and ulnar nerve are intact in the right hand she has good grip strength she has good pulses.  Review of the radiographs shows a displaced short oblique fracture midshaft right humerus.   Assessment/Plan 1. Closed displaced oblique fracture of shaft of right humerus, initial encounter     Plan:  Discussed operative versus nonoperative treatment.  Patient states she would like to proceed with surgery discussed that she is an increased risk of infection neurovascular injury radial nerve palsy need for additional surgery.     Nadara Mustard, MD 03/29/2017, 7:22 AM

## 2017-03-29 NOTE — Anesthesia Postprocedure Evaluation (Signed)
Anesthesia Post Note  Patient: Margaret [name]  Procedure(s) Performed: OPEN REDUCTION INTERNAL FIXATION (ORIF) RIGHT HUMERAL SHAFT FRACTURE (Right )     Patient location during evaluation: PACU Anesthesia Type: General Level of consciousness: sedated Pain management: pain level controlled Vital Signs Assessment: post-procedure vital signs reviewed and stable Respiratory status: spontaneous breathing Cardiovascular status: stable Postop Assessment: no apparent nausea or vomiting Anesthetic complications: no    Last Vitals:  Vitals:   03/29/17 1435 03/29/17 1450  BP: 114/65 111/82  Pulse: 79 86  Resp: 20 17  Temp:    SpO2: 98% 98%    Last Pain:  Vitals:   03/29/17 1500  TempSrc:   PainSc: Asleep   Pain Goal: Patients Stated Pain Goal: 7 (03/29/17 1132)               Cyree Chuong JR,JOHN Susann Givens

## 2017-03-29 NOTE — Anesthesia Preprocedure Evaluation (Signed)
Anesthesia Evaluation  Patient identified by MRN, date of birth, ID band Patient awake    Reviewed: Allergy & Precautions, NPO status , Patient's Chart, lab work & pertinent test results  History of Anesthesia Complications Negative for: history of anesthetic complications  Airway Mallampati: III  TM Distance: >3 FB Neck ROM: Full    Dental  (+) Dental Advisory Given, Poor Dentition   Pulmonary asthma , sleep apnea , Current Smoker,    Pulmonary exam normal breath sounds clear to auscultation       Cardiovascular hypertension, Pt. on medications Normal cardiovascular exam Rhythm:Regular Rate:Normal     Neuro/Psych PSYCHIATRIC DISORDERS Bipolar Disorder negative neurological ROS     GI/Hepatic Neg liver ROS, GERD  Medicated,  Endo/Other  diabetes, Type 2, Oral Hypoglycemic Agentsobesity  Renal/GU negative Renal ROS  negative genitourinary   Musculoskeletal  (+) Arthritis ,   Abdominal (+) + obese,   Peds negative pediatric ROS (+)  Hematology negative hematology ROS (+)   Anesthesia Other Findings   Reproductive/Obstetrics negative OB ROS                             Anesthesia Physical  Anesthesia Plan  ASA: II  Anesthesia Plan: General   Post-op Pain Management:    Induction: Intravenous  PONV Risk Score and Plan: 3 and Ondansetron, Treatment may vary due to age or medical condition and Midazolam  Airway Management Planned: Oral ETT  Additional Equipment:   Intra-op Plan:   Post-operative Plan: Extubation in OR  Informed Consent: I have reviewed the patients History and Physical, chart, labs and discussed the procedure including the risks, benefits and alternatives for the proposed anesthesia with the patient or authorized representative who has indicated his/her understanding and acceptance.   Dental advisory given  Plan Discussed with: CRNA and Surgeon  Anesthesia  Plan Comments:         Anesthesia Quick Evaluation

## 2017-04-01 ENCOUNTER — Ambulatory Visit: Payer: Medicare Other | Admitting: Orthopedic Surgery

## 2017-04-02 ENCOUNTER — Encounter (HOSPITAL_COMMUNITY): Payer: Self-pay | Admitting: Orthopedic Surgery

## 2017-04-04 NOTE — Progress Notes (Signed)
Office Visit Note  Patient: Margaret [name]             Date of Birth: 07-10-1950           MRN: [id number]             PCP: Kirstie Peri, MD Referring: Kirstie Peri, MD Visit Date: 04/18/2017 Occupation: @GUAROCC @    Subjective:  Right shoulder pain    History of Present Illness: Margaret [name] is a 67 y.o. female with history of seropositive rheumatoid arthritis, osteoarthritis, DDD.  Patient states she fell at home on February 18 and fractured her right humerus.  She states she had surgery on 03/29/2017 by Dr. 03/31/2017.  She followed up with him on 04/21/2017.  She continues to take Percocet as needed for pain relief.  She has significant pain in her right arm.  She has significant swelling and bruising.  She has been working on range of motion at home.  She states she only held her sulfasalazine on the day of the surgery.  She denies any flares of her rheumatoid arthritis.  She does state that she has noticed 2 new nodules that are formed on her right second and third PIP joints.  She continues to have rheumatoid nodules on her bilateral elbows.  She is having pain in her left ankle as well as swelling.  She is unsure if she injured her ankle when she fell.  She denies having any x-rays or evaluation of her left ankle. She states she continues to have chronic lower back pain.  She states she has radiation of pain down to her left knee.  She is going to make a follow-up appointment with her neurosurgeon.  She states she has occasional discomfort in her bilateral knees especially when climbing stairs.  She denies any joint swelling in her knees.  She would like to reapply for Hyalgan knee injections.  She states that her PCP performed the injections in the past due to living in it and it has been difficult to come to the office for these injections.    Activities of Daily Living:  Patient reports morning stiffness for 0 minutes.   Patient Reports nocturnal pain.  Difficulty dressing/grooming:  Denies Difficulty climbing stairs: Reports Difficulty getting out of chair: Denies Difficulty using hands for taps, buttons, cutlery, and/or writing: Denies   Review of Systems  Constitutional: Positive for fatigue and weakness.  HENT: Negative for ear pain, mouth sores, nosebleeds and mouth dryness.   Eyes: Negative for pain and dryness.  Respiratory: Negative for cough and shortness of breath.   Cardiovascular: Positive for swelling in legs/feet. Negative for chest pain, palpitations and hypertension.  Gastrointestinal: Positive for constipation. Negative for blood in stool and diarrhea.  Endocrine: Negative for increased urination.  Genitourinary: Negative for painful urination and hematuria.  Musculoskeletal: Positive for arthralgias, joint pain, joint swelling and muscle weakness. Negative for myalgias, morning stiffness, muscle tenderness and myalgias.  Skin: Positive for sensitivity to sunlight. Negative for rash and hair loss.  Allergic/Immunologic: Negative for susceptible to infections.  Neurological: Negative for dizziness, light-headedness, numbness and headaches.  Hematological: Negative for bruising/bleeding tendency.  Psychiatric/Behavioral: Positive for sleep disturbance. Negative for depressed mood. The patient is not nervous/anxious.     PMFS History:  Patient Active Problem List   Diagnosis Date Noted  . Closed displaced oblique fracture of shaft of right humerus 03/27/2017  . Smoker 01/09/2016  . High risk medication use 01/06/2016  . RA (rheumatoid arthritis) (  HCC) 01/05/2016  . DDD (degenerative disc disease), lumbar 01/05/2016  . Calcaneal spur 01/05/2016  . Bipolar disorder (HCC) 01/05/2016  . ADD (attention deficit disorder) 01/05/2016  . Sleep apnea 01/05/2016  . TMJ (dislocation of temporomandibular joint) 01/05/2016  . Pedal edema 01/05/2016  . Osteoarthritis of both hands 01/05/2016  . Osteoarthritis of both feet 01/05/2016  . Osteoarthritis of both  knees 01/05/2016  . GERD (gastroesophageal reflux disease) 06/24/2015  . Constipation 06/24/2015  . Spondylolisthesis at L4-L5 level 01/24/2015  . 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 CONTRACTURE OF SHOULDER JOINT 04/24/2007  . DIABETES 07/30/2006    Past Medical History:  Diagnosis Date  . ADD (attention deficit disorder) 01/05/2016  . Arthritis   . Asthma   . Benign essential hypertension   . Bipolar disorder [facility])    hospitalized at [facility] for manic episodes  . Calcaneal spur 01/05/2016  . DDD (degenerative disc disease), lumbar 01/05/2016  . Diabetes mellitus without complication (HCC)   . Diabetic neuropathy (HCC)   . Dysrhythmia    takes diltiazem prescribed by medical doctor; has never seen cardiologist  . Eczema   . Gastroesophageal reflux disease   . Humeral shaft fracture    right  . Hyperlipidemia   . Hypersomnia   . IBS (irritable bowel syndrome)   . Myalgia   . Noncompliance with CPAP treatment   . Osteoarthritis of both feet 01/05/2016  . Osteoarthritis of both hands 01/05/2016  . Osteoarthritis of both knees 01/05/2016  . Pedal edema 01/05/2016  . Peripheral vascular disease (HCC)   . RA (rheumatoid arthritis) (HCC) 01/05/2016   +RF, +CCP, Nodulous,   . Rheumatoid arthritis (HCC)   . Sleep apnea 2015   home sleep study   . TMJ (dislocation of temporomandibular joint) 01/05/2016  . Varicose veins     Family History  Problem Relation Age of Onset  . Hypertension Father   . Alcoholism Mother   . Stroke Mother   . AAA (abdominal aortic aneurysm) Brother   . Gallstones Sister   . Osteoporosis Sister   . Alcoholism Brother   . Diabetes Brother   . Colon cancer Neg Hx   . Colon polyps Neg Hx    Past Surgical History:  Procedure Laterality Date  . BACK SURGERY    . CESAREAN SECTION    . COLONOSCOPY  2011   Dr. Loreta Ave in [place]  . ELBOW SURGERY    . ENDOVENOUS ABLATION SAPHENOUS VEIN W/ LASER    . ORIF HUMERUS FRACTURE Right 03/29/2017   Procedure:  OPEN REDUCTION INTERNAL FIXATION (ORIF) RIGHT HUMERAL SHAFT FRACTURE;  Surgeon: Nadara Mustard, MD;  Location: [facility] OR;  Service: Orthopedics;  Laterality: Right;   Social History   Social History Narrative  . Not on file     Objective: Vital Signs: BP 128/65 (BP Location: Left Arm, Patient Position: Sitting, Cuff Size: Normal)   Pulse (!) 102   Resp 16   Ht 5\' 2"  (1.575 m)   Wt 194 lb (88 kg)   BMI 35.48 kg/m    Physical Exam  Constitutional: She is oriented to person, place, and time. She appears well-developed and well-nourished.  HENT:  Head: Normocephalic and atraumatic.  Eyes: Conjunctivae and EOM are normal.  Neck: Normal range of motion.  Cardiovascular: Normal rate, regular rhythm, normal heart sounds and intact distal pulses.  Pulmonary/Chest: Effort normal and breath sounds normal.  Abdominal: Soft. Bowel sounds are normal.  Lymphadenopathy:    She has no cervical  adenopathy.  Neurological: She is alert and oriented to person, place, and time.  Skin: Skin is warm and dry. Capillary refill takes less than 2 seconds.  Psychiatric: She has a normal mood and affect. Her behavior is normal.  Nursing note and vitals reviewed.    Musculoskeletal Exam: C-spine, thoracic spine, lumbar spine good range of motion.  No midline spinal tenderness.  No SI joint tenderness.  Right shoulder is in a sling.  Left shoulder good range of motion.  Rheumatoid nodules on her bilateral elbows.  Left wrist good range of motion with no synovitis.  MCPs PIPs and DIPs good range of motion with no synovitis. Bruising noted around fingernail.  She has diffuse swelling of her right hand. No warmth or effusion of knees.  Good ROM of bilateral knees.  She has tenderness along the joint line of bilateral ankles.  She has swelling on the lateral aspect of bilateral ankles.  She has ace bandage on her left ankle.  She has bruising on the dorsal surface of bilateral feet.    CDAI Exam: CDAI Homunculus  Exam:   Joint Counts:  CDAI Tender Joint count: 0 CDAI Swollen Joint count: 0  Global Assessments:  Patient Global Assessment: 6   CDAI Calculated Score: 6    Investigation: No additional findings. CBC Latest Ref Rng & Units 03/29/2017 02/12/2017 10/23/2016  WBC 4.0 - 10.5 K/uL 10.4 9.0 8.9  Hemoglobin 12.0 - 15.0 g/dL 11.3(L) 12.4 12.6  Hematocrit 36.0 - 46.0 % 36.2 38.8 38.7  Platelets 150 - 400 K/uL 289 295 262   CMP Latest Ref Rng & Units 03/29/2017 02/12/2017 10/23/2016  Glucose 65 - 99 mg/dL 440(N) 027(O) 536(U)  BUN 6 - 20 mg/dL 10 18 17   Creatinine 0.44 - 1.00 mg/dL 4.40 3.47(Q) 2.59(D)  Sodium 135 - 145 mmol/L 138 140 141  Potassium 3.5 - 5.1 mmol/L 4.4 4.8 4.3  Chloride 101 - 111 mmol/L 109 107 110  CO2 22 - 32 mmol/L 19(L) 22 20  Calcium 8.9 - 10.3 mg/dL 6.3(O) 9.2 9.1  Total Protein 6.1 - 8.1 g/dL - 6.7 6.3  Total Bilirubin 0.2 - 1.2 mg/dL - 0.3 0.3  Alkaline Phos 33 - 130 U/L - - -  AST 10 - 35 U/L - 11 16  ALT 6 - 29 U/L - 11 13    Imaging: Dg Humerus Right  Result Date: 03/26/2017 CLINICAL DATA:  Right arm pain after fall today. EXAM: RIGHT HUMERUS - 2+ VIEW COMPARISON:  None. FINDINGS: Moderately displaced oblique fracture is seen involving the midshaft of the right humerus. Linear lucency is seen involving the proximal right humeral head and neck suggesting possible nondisplaced fracture. IMPRESSION: Moderately displaced oblique fracture involving right humeral midshaft. Linear lucency is seen involving proximal right humeral head and neck suggesting possible nondisplaced fracture. Electronically Signed   By: Lupita Raider, M.D.   On: 03/26/2017 15:48   Xr Humerus Right  Result Date: 04/11/2017 2 view radiographs of the right humerus show stable internal fixation.  There is a fracture that extends proximal to the plate but there is no displacement   Speciality Comments: No specialty comments available.    Procedures:  No procedures performed Allergies:  Fluoxetine hcl (pmdd); Hydroxychloroquine; and Vicodin [hydrocodone-acetaminophen]   Assessment / Plan:     Visit Diagnoses: Rheumatoid arthritis involving multiple sites with positive rheumatoid factor (HCC): She has no synovitis on exam.  She has rheumatoid nodules on her bilateral elbows and on her right  second and third PIP joints. She held her SSZ dose on the day of surgery.  She has not had any rheumatoid arthritis flares. She will continue on sulfasalazine 500 mg 2 tablets in the morning and 1 tablet in the evening.  A refill of 90-day supply was sent to the pharmacy today.  High risk medication use - SSZ 1000 mg in the morning and 500 mg in the evening.  She will return in May for CBC and CMP to monitor for drug toxicity.  Acute left ankle pain: She is unsure if she injured her ankle when she fell March 25, 2017. She is weight bearing. She has swelling on the lateral aspect and bruising.  She has been wearing an Ace bandage to help with the swelling.  She has tenderness along the joint line.  X-rays of her left ankle and foot were obtained in the office today.  Pain of right humerus: Status post open reduction internal fixation right midshaft humerus fracture: Performed by Dr. Lajoyce Corners on 03/29/17.  She is still taking Percocet PRN. She was wearing a sling in the office today.  She has been working on ROM exercises advised by Dr. Lajoyce Corners. She followed up with him on 05/02/17 and they will determine if she will require PT.    Primary osteoarthritis of both hands: She has PIP and DIP synovial thickening consistent with osteoarthritis. Joint protection and muscle strengthening were discussed.  Primary osteoarthritis of both knees: No warmth or effusion of bilateral knees.  She has good range of motion on exam.  Primary osteoarthritis of both feet: X-ray of her left foot was obtained today.  She has swelling and bruising of her left foot.    DDD (degenerative disc disease), lumbar: No midline spinal  tenderness.  She is going to follow-up with her neurosurgeon due to increased pain and radiation of pain and numbness to her left knee.  Spondylolisthesis at L4-L5 level  Other medical conditions are listed as follows:  Smoker  History of asthma  History of gastroesophageal reflux (GERD)  History of IBS  Essential hypertension  History of diabetes mellitus  History of bipolar disorder  Attention deficit disorder, unspecified hyperactivity presence    Orders: No orders of the defined types were placed in this encounter.  No orders of the defined types were placed in this encounter.   Face-to-face time spent with patient was 30 minutes.> 50% of time was spent in counseling and coordination of care.  Follow-Up Instructions: No Follow-up on file.   Gearldine Bienenstock, PA-C   I examined and evaluated the patient with Sherron Ales PA. The plan of care was discussed as noted above.  Pollyann Savoy, MD Note - This record has been created using Animal nutritionist.  Chart creation errors have been sought, but may not always  have been located. Such creation errors do not reflect on  the standard of medical care.

## 2017-04-11 ENCOUNTER — Encounter (INDEPENDENT_AMBULATORY_CARE_PROVIDER_SITE_OTHER): Payer: Self-pay | Admitting: Orthopedic Surgery

## 2017-04-11 ENCOUNTER — Ambulatory Visit (INDEPENDENT_AMBULATORY_CARE_PROVIDER_SITE_OTHER): Payer: Medicare Other

## 2017-04-11 ENCOUNTER — Ambulatory Visit (INDEPENDENT_AMBULATORY_CARE_PROVIDER_SITE_OTHER): Payer: Medicare Other | Admitting: Orthopedic Surgery

## 2017-04-11 VITALS — Ht 62.0 in | Wt 190.0 lb

## 2017-04-11 DIAGNOSIS — M898X2 Other specified disorders of bone, upper arm: Secondary | ICD-10-CM

## 2017-04-11 DIAGNOSIS — M79621 Pain in right upper arm: Secondary | ICD-10-CM

## 2017-04-11 DIAGNOSIS — S42331A Displaced oblique fracture of shaft of humerus, right arm, initial encounter for closed fracture: Secondary | ICD-10-CM

## 2017-04-11 NOTE — Progress Notes (Signed)
Office Visit Note   Patient: Margaret [name]           Date of Birth: 07/17/1950           MRN: [id number] Visit Date: 04/11/2017              Requested by: Kirstie Peri, MD [address] [place], [place] [address] PCP: Kirstie Peri, MD  Chief Complaint  Patient presents with  . Right Upper Arm - Routine Post Op    ORIF right humeral shaft fx      HPI: Patient is a 67 year old woman who is 2 weeks status post open reduction internal fixation right midshaft humerus fracture.  Assessment & Plan: Visit Diagnoses:  1. Pain of right humerus   2. Closed displaced oblique fracture of shaft of right humerus, initial encounter     Plan: Patient was given instructions for finger walking up the wall elbow range of motion and pumping her hand on a exercise ball to decrease the swelling as well as elevation.  At follow-up we will evaluate for possible need for physical therapy.  Follow-Up Instructions: Return in about 3 weeks (around 05/02/2017).   Ortho Exam  Patient is alert, oriented, no adenopathy, well-dressed, normal affect, normal respiratory effort. Examination incision is well-healed her radial nerve is intact.  Imaging: No results found. No images are attached to the encounter.  Labs: Lab Results  Component Value Date   HGBA1C 6.7 (H) 01/20/2015    @LABSALLVALUES (HGBA1)@  Body mass index is 34.75 kg/m.  Orders:  Orders Placed This Encounter  Procedures  . XR Humerus Right   No orders of the defined types were placed in this encounter.    Procedures: No procedures performed  Clinical Data: No additional findings.  ROS:  All other systems negative, except as noted in the HPI. Review of Systems  Objective: Vital Signs: Ht 5\' 2"  (1.575 m)   Wt 190 lb (86.2 kg)   BMI 34.75 kg/m   Specialty Comments:  No specialty comments available.  PMFS History: Patient Active Problem List   Diagnosis Date Noted  . Closed displaced oblique fracture of shaft of right  humerus 03/27/2017  . Smoker 01/09/2016  . High risk medication use 01/06/2016  . RA (rheumatoid arthritis) (HCC) 01/05/2016  . DDD (degenerative disc disease), lumbar 01/05/2016  . Calcaneal spur 01/05/2016  . Bipolar disorder (HCC) 01/05/2016  . ADD (attention deficit disorder) 01/05/2016  . Sleep apnea 01/05/2016  . TMJ (dislocation of temporomandibular joint) 01/05/2016  . Pedal edema 01/05/2016  . Osteoarthritis of both hands 01/05/2016  . Osteoarthritis of both feet 01/05/2016  . Osteoarthritis of both knees 01/05/2016  . GERD (gastroesophageal reflux disease) 06/24/2015  . Constipation 06/24/2015  . Spondylolisthesis at L4-L5 level 01/24/2015  . 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 CONTRACTURE OF SHOULDER JOINT 04/24/2007  . DIABETES 07/30/2006   Past Medical History:  Diagnosis Date  . ADD (attention deficit disorder) 01/05/2016  . Arthritis   . Asthma   . Benign essential hypertension   . Bipolar disorder [facility])    hospitalized at [facility] for manic episodes  . Calcaneal spur 01/05/2016  . DDD (degenerative disc disease), lumbar 01/05/2016  . Diabetes mellitus without complication (HCC)   . Diabetic neuropathy (HCC)   . Dysrhythmia    takes diltiazem prescribed by medical doctor; has never seen cardiologist  . Eczema   . Gastroesophageal reflux disease   . Humeral shaft fracture    right  . Hyperlipidemia   .  Hypersomnia   . IBS (irritable bowel syndrome)   . Myalgia   . Noncompliance with CPAP treatment   . Osteoarthritis of both feet 01/05/2016  . Osteoarthritis of both hands 01/05/2016  . Osteoarthritis of both knees 01/05/2016  . Pedal edema 01/05/2016  . Peripheral vascular disease (HCC)   . RA (rheumatoid arthritis) (HCC) 01/05/2016   +RF, +CCP, Nodulous,   . Rheumatoid arthritis (HCC)   . Sleep apnea 2015   home sleep study   . TMJ (dislocation of temporomandibular joint) 01/05/2016  . Varicose veins     Family History  Problem Relation Age of Onset    . Hypertension Father   . AAA (abdominal aortic aneurysm) Brother   . Colon cancer Neg Hx   . Colon polyps Neg Hx     Past Surgical History:  Procedure Laterality Date  . BACK SURGERY    . CESAREAN SECTION    . COLONOSCOPY  2011   Dr. Loreta Ave in [place]  . ELBOW SURGERY    . ENDOVENOUS ABLATION SAPHENOUS VEIN W/ LASER    . ORIF HUMERUS FRACTURE Right 03/29/2017   Procedure: OPEN REDUCTION INTERNAL FIXATION (ORIF) RIGHT HUMERAL SHAFT FRACTURE;  Surgeon: Nadara Mustard, MD;  Location: [facility] OR;  Service: Orthopedics;  Laterality: Right;   Social History   Occupational History  . Not on file  Tobacco Use  . Smoking status: Current Every Day Smoker    Packs/day: 1.00    Years: 40.00    Pack years: 40.00    Types: Cigarettes  . Smokeless tobacco: Never Used  Substance and Sexual Activity  . Alcohol use: No    Alcohol/week: 0.0 oz  . Drug use: No  . Sexual activity: Not on file

## 2017-04-16 ENCOUNTER — Other Ambulatory Visit (INDEPENDENT_AMBULATORY_CARE_PROVIDER_SITE_OTHER): Payer: Self-pay | Admitting: Orthopedic Surgery

## 2017-04-16 ENCOUNTER — Telehealth (INDEPENDENT_AMBULATORY_CARE_PROVIDER_SITE_OTHER): Payer: Self-pay | Admitting: Orthopedic Surgery

## 2017-04-16 MED ORDER — OXYCODONE-ACETAMINOPHEN 5-325 MG PO TABS: 1.0000 | ORAL_TABLET | Freq: Three times a day (TID) | ORAL | 0 refills | Status: DC | PRN

## 2017-04-16 NOTE — Telephone Encounter (Signed)
rx written

## 2017-04-16 NOTE — Telephone Encounter (Signed)
Patient called asking for a refill on her pain medication for when she works out. CB # [phone]

## 2017-04-16 NOTE — Telephone Encounter (Signed)
03/29/17 right ORIF humeral shaft fx. Pt requesting rx for percocet. Last refill was 03/09/17 #60 also had #30 03/26/17 and #12 03/18/17

## 2017-04-17 NOTE — Telephone Encounter (Signed)
I called and sw pt to advise that rx is ready for pick up.

## 2017-04-18 ENCOUNTER — Ambulatory Visit: Payer: Medicare Other | Admitting: Rheumatology

## 2017-04-18 ENCOUNTER — Ambulatory Visit (INDEPENDENT_AMBULATORY_CARE_PROVIDER_SITE_OTHER): Payer: Self-pay

## 2017-04-18 ENCOUNTER — Encounter: Payer: Self-pay | Admitting: Physician Assistant

## 2017-04-18 VITALS — BP 128/65 | HR 102 | Resp 16 | Ht 62.0 in | Wt 194.0 lb

## 2017-04-18 DIAGNOSIS — Z79899 Other long term (current) drug therapy: Secondary | ICD-10-CM | POA: Diagnosis not present

## 2017-04-18 DIAGNOSIS — I10 Essential (primary) hypertension: Secondary | ICD-10-CM | POA: Diagnosis not present

## 2017-04-18 DIAGNOSIS — M5136 Other intervertebral disc degeneration, lumbar region: Secondary | ICD-10-CM

## 2017-04-18 DIAGNOSIS — Z8659 Personal history of other mental and behavioral disorders: Secondary | ICD-10-CM

## 2017-04-18 DIAGNOSIS — M19042 Primary osteoarthritis, left hand: Secondary | ICD-10-CM

## 2017-04-18 DIAGNOSIS — M19041 Primary osteoarthritis, right hand: Secondary | ICD-10-CM | POA: Diagnosis not present

## 2017-04-18 DIAGNOSIS — M19071 Primary osteoarthritis, right ankle and foot: Secondary | ICD-10-CM

## 2017-04-18 DIAGNOSIS — R748 Abnormal levels of other serum enzymes: Secondary | ICD-10-CM

## 2017-04-18 DIAGNOSIS — M170 Bilateral primary osteoarthritis of knee: Secondary | ICD-10-CM

## 2017-04-18 DIAGNOSIS — Z8719 Personal history of other diseases of the digestive system: Secondary | ICD-10-CM | POA: Diagnosis not present

## 2017-04-18 DIAGNOSIS — F988 Other specified behavioral and emotional disorders with onset usually occurring in childhood and adolescence: Secondary | ICD-10-CM

## 2017-04-18 DIAGNOSIS — Z8709 Personal history of other diseases of the respiratory system: Secondary | ICD-10-CM | POA: Diagnosis not present

## 2017-04-18 DIAGNOSIS — M19072 Primary osteoarthritis, left ankle and foot: Secondary | ICD-10-CM

## 2017-04-18 DIAGNOSIS — M51369 Other intervertebral disc degeneration, lumbar region without mention of lumbar back pain or lower extremity pain: Secondary | ICD-10-CM

## 2017-04-18 DIAGNOSIS — M0579 Rheumatoid arthritis with rheumatoid factor of multiple sites without organ or systems involvement: Secondary | ICD-10-CM | POA: Diagnosis not present

## 2017-04-18 DIAGNOSIS — F17200 Nicotine dependence, unspecified, uncomplicated: Secondary | ICD-10-CM | POA: Diagnosis not present

## 2017-04-18 DIAGNOSIS — Z8639 Personal history of other endocrine, nutritional and metabolic disease: Secondary | ICD-10-CM

## 2017-04-18 DIAGNOSIS — M25572 Pain in left ankle and joints of left foot: Secondary | ICD-10-CM

## 2017-04-18 DIAGNOSIS — M4316 Spondylolisthesis, lumbar region: Secondary | ICD-10-CM

## 2017-04-18 MED ORDER — SULFASALAZINE 500 MG PO TBEC: DELAYED_RELEASE_TABLET | ORAL | 1 refills | Status: DC

## 2017-04-18 NOTE — Patient Instructions (Signed)
Standing Labs We placed an order today for your standing lab work.    Please come back and get your standing labs in May and every 3 months  We have open lab Monday through Friday from 8:30-11:30 AM and 1:30-4 PM at the office of Dr. Shaili Deveshwar.   The office is located at [address], [address], [place], [place] [address] No appointment is necessary.   Labs are drawn by [facility].  You may receive a bill from [facility] for your lab work. If you have any questions regarding directions or hours of operation,  please call [phone].    

## 2017-05-02 ENCOUNTER — Ambulatory Visit (INDEPENDENT_AMBULATORY_CARE_PROVIDER_SITE_OTHER): Payer: Medicare Other | Admitting: Orthopedic Surgery

## 2017-05-02 ENCOUNTER — Encounter (INDEPENDENT_AMBULATORY_CARE_PROVIDER_SITE_OTHER): Payer: Self-pay | Admitting: Orthopedic Surgery

## 2017-05-02 VITALS — Ht 62.0 in | Wt 194.0 lb

## 2017-05-02 DIAGNOSIS — S42331A Displaced oblique fracture of shaft of humerus, right arm, initial encounter for closed fracture: Secondary | ICD-10-CM

## 2017-05-02 NOTE — Progress Notes (Signed)
Office Visit Note   Patient: Margaret [name]           Date of Birth: 1950/07/27           MRN: [id number] Visit Date: 05/02/2017              Requested by: Kirstie Peri, MD [address] [place], [place] [address] PCP: Kirstie Peri, MD  Chief Complaint  Patient presents with  . Right Upper Arm - Routine Post Op    03/29/17 ORIF right humeral shaft fracture      HPI: Patient presents for follow-up status post open reduction internal fixation right humeral shaft fracture she is 4 weeks out she states she is doing well she states she was able to lift a hair dryer and dry her own hair she has not been lifting with the arm she is quite pleased with her progress.  Assessment & Plan: Visit Diagnoses:  1. Closed displaced oblique fracture of shaft of right humerus, initial encounter     Plan: Patient was given instruction and demonstrated range of motion exercises for her shoulder.  She can lift up to a half a gallon of milk.  Follow-up in 4 weeks.  Obtain 2 view radiographs of the right humerus at follow-up.  Follow-Up Instructions: Return in about 1 month (around 05/30/2017).   Ortho Exam  Patient is alert, oriented, no adenopathy, well-dressed, normal affect, normal respiratory effort. Examination patient has good function of the right upper extremity with both median radial ulnar nerves intact.  She lacks about 20 degrees of abduction and flexion of her shoulder.  She has full range of motion the elbow wrist and fingers.  Imaging: No results found. No images are attached to the encounter.  Labs: Lab Results  Component Value Date   HGBA1C 6.7 (H) 01/20/2015    @LABSALLVALUES (HGBA1)@  Body mass index is 35.48 kg/m.  Orders:  No orders of the defined types were placed in this encounter.  No orders of the defined types were placed in this encounter.    Procedures: No procedures performed  Clinical Data: No additional findings.  ROS:  All other systems negative,  except as noted in the HPI. Review of Systems  Objective: Vital Signs: Ht 5\' 2"  (1.575 m)   Wt 194 lb (88 kg)   BMI 35.48 kg/m   Specialty Comments:  No specialty comments available.  PMFS History: Patient Active Problem List   Diagnosis Date Noted  . Closed displaced oblique fracture of shaft of right humerus 03/27/2017  . Smoker 01/09/2016  . High risk medication use 01/06/2016  . RA (rheumatoid arthritis) (HCC) 01/05/2016  . DDD (degenerative disc disease), lumbar 01/05/2016  . Calcaneal spur 01/05/2016  . Bipolar disorder (HCC) 01/05/2016  . ADD (attention deficit disorder) 01/05/2016  . Sleep apnea 01/05/2016  . TMJ (dislocation of temporomandibular joint) 01/05/2016  . Pedal edema 01/05/2016  . Osteoarthritis of both hands 01/05/2016  . Osteoarthritis of both feet 01/05/2016  . Osteoarthritis of both knees 01/05/2016  . GERD (gastroesophageal reflux disease) 06/24/2015  . Constipation 06/24/2015  . Spondylolisthesis at L4-L5 level 01/24/2015  . 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 CONTRACTURE OF SHOULDER JOINT 04/24/2007  . DIABETES 07/30/2006   Past Medical History:  Diagnosis Date  . ADD (attention deficit disorder) 01/05/2016  . Arthritis   . Asthma   . Benign essential hypertension   . Bipolar disorder [facility])    hospitalized at [facility] for manic episodes  . Calcaneal spur 01/05/2016  .  DDD (degenerative disc disease), lumbar 01/05/2016  . Diabetes mellitus without complication (HCC)   . Diabetic neuropathy (HCC)   . Dysrhythmia    takes diltiazem prescribed by medical doctor; has never seen cardiologist  . Eczema   . Gastroesophageal reflux disease   . Humeral shaft fracture    right  . Hyperlipidemia   . Hypersomnia   . IBS (irritable bowel syndrome)   . Myalgia   . Noncompliance with CPAP treatment   . Osteoarthritis of both feet 01/05/2016  . Osteoarthritis of both hands 01/05/2016  . Osteoarthritis of both knees 01/05/2016  . Pedal edema  01/05/2016  . Peripheral vascular disease (HCC)   . RA (rheumatoid arthritis) (HCC) 01/05/2016   +RF, +CCP, Nodulous,   . Rheumatoid arthritis (HCC)   . Sleep apnea 2015   home sleep study   . TMJ (dislocation of temporomandibular joint) 01/05/2016  . Varicose veins     Family History  Problem Relation Age of Onset  . Hypertension Father   . Alcoholism Mother   . Stroke Mother   . AAA (abdominal aortic aneurysm) Brother   . Gallstones Sister   . Osteoporosis Sister   . Alcoholism Brother   . Diabetes Brother   . Colon cancer Neg Hx   . Colon polyps Neg Hx     Past Surgical History:  Procedure Laterality Date  . BACK SURGERY    . CESAREAN SECTION    . COLONOSCOPY  2011   Dr. Loreta Ave in [place]  . ELBOW SURGERY    . ENDOVENOUS ABLATION SAPHENOUS VEIN W/ LASER    . ORIF HUMERUS FRACTURE Right 03/29/2017   Procedure: OPEN REDUCTION INTERNAL FIXATION (ORIF) RIGHT HUMERAL SHAFT FRACTURE;  Surgeon: Nadara Mustard, MD;  Location: [facility] OR;  Service: Orthopedics;  Laterality: Right;   Social History   Occupational History  . Not on file  Tobacco Use  . Smoking status: Current Every Day Smoker    Packs/day: 1.00    Years: 40.00    Pack years: 40.00    Types: Cigarettes  . Smokeless tobacco: Never Used  Substance and Sexual Activity  . Alcohol use: No    Alcohol/week: 0.0 oz  . Drug use: No  . Sexual activity: Not on file

## 2017-05-30 ENCOUNTER — Ambulatory Visit (INDEPENDENT_AMBULATORY_CARE_PROVIDER_SITE_OTHER): Payer: Medicare Other | Admitting: Orthopedic Surgery

## 2017-05-31 ENCOUNTER — Telehealth: Payer: Self-pay | Admitting: *Deleted

## 2017-05-31 NOTE — Telephone Encounter (Signed)
Hyalgan delivered to office 05/31/17, Bil, patient purchased, please schedule appt w/Taylor x 5. Thank you.

## 2017-05-31 NOTE — Telephone Encounter (Signed)
LMOM (home and mobile)  for patient to call and schedule Hyalgan injections.

## 2017-06-03 ENCOUNTER — Other Ambulatory Visit: Payer: Self-pay

## 2017-06-03 ENCOUNTER — Encounter (INDEPENDENT_AMBULATORY_CARE_PROVIDER_SITE_OTHER): Payer: Self-pay | Admitting: Orthopedic Surgery

## 2017-06-03 ENCOUNTER — Ambulatory Visit (INDEPENDENT_AMBULATORY_CARE_PROVIDER_SITE_OTHER): Payer: Medicare Other | Admitting: Orthopedic Surgery

## 2017-06-03 ENCOUNTER — Ambulatory Visit (INDEPENDENT_AMBULATORY_CARE_PROVIDER_SITE_OTHER): Payer: Medicare Other

## 2017-06-03 DIAGNOSIS — S42331A Displaced oblique fracture of shaft of humerus, right arm, initial encounter for closed fracture: Secondary | ICD-10-CM | POA: Diagnosis not present

## 2017-06-03 DIAGNOSIS — Z79899 Other long term (current) drug therapy: Secondary | ICD-10-CM

## 2017-06-03 LAB — COMPLETE METABOLIC PANEL WITH GFR
AG Ratio: 2 (calc) (ref 1.0–2.5)
ALKALINE PHOSPHATASE (APISO): 74 U/L (ref 33–130)
ALT: 13 U/L (ref 6–29)
AST: 12 U/L (ref 10–35)
Albumin: 4.3 g/dL (ref 3.6–5.1)
BUN: 16 mg/dL (ref 7–25)
CALCIUM: 9.3 mg/dL (ref 8.6–10.4)
CO2: 21 mmol/L (ref 20–32)
CREATININE: 0.93 mg/dL (ref 0.50–0.99)
Chloride: 112 mmol/L — ABNORMAL HIGH (ref 98–110)
GFR, EST NON AFRICAN AMERICAN: 65 mL/min/{1.73_m2} (ref >=60)
GFR, Est African American: 75 mL/min/{1.73_m2} (ref >=60)
GLOBULIN: 2.1 g/dL (ref 1.9–3.7)
GLUCOSE: 45 mg/dL — AB (ref 65–139)
Potassium: 4.3 mmol/L (ref 3.5–5.3)
Sodium: 141 mmol/L (ref 135–146)
Total Bilirubin: 0.2 mg/dL (ref 0.2–1.2)
Total Protein: 6.4 g/dL (ref 6.1–8.1)

## 2017-06-03 LAB — CBC WITH DIFFERENTIAL/PLATELET
Basophils Absolute: 59 cells/uL (ref 0–200)
Basophils Relative: 0.7 %
EOS PCT: 2.6 %
Eosinophils Absolute: 218 cells/uL (ref 15–500)
HEMATOCRIT: 34.2 % — AB (ref 35.0–45.0)
Hemoglobin: 11 g/dL — ABNORMAL LOW (ref 11.7–15.5)
Lymphs Abs: 2100 cells/uL (ref 850–3900)
MCH: 27.2 pg (ref 27.0–33.0)
MCHC: 32.2 g/dL (ref 32.0–36.0)
MCV: 84.4 fL (ref 80.0–100.0)
MONOS PCT: 7.5 %
MPV: 9.3 fL (ref 7.5–12.5)
NEUTROS PCT: 64.2 %
Neutro Abs: 5393 cells/uL (ref 1500–7800)
PLATELETS: 327 10*3/uL (ref 140–400)
RBC: 4.05 10*6/uL (ref 3.80–5.10)
RDW: 14.8 % (ref 11.0–15.0)
Total Lymphocyte: 25 %
WBC mixed population: 630 cells/uL (ref 200–950)
WBC: 8.4 10*3/uL (ref 3.8–10.8)

## 2017-06-03 NOTE — Progress Notes (Signed)
Office Visit Note   Patient: Margaret [name]           Date of Birth: 1951-02-05           MRN: [id number] Visit Date: 06/03/2017              Requested by: Kirstie Peri, MD [address] [place], [place] [address] PCP: Kirstie Peri, MD  Chief Complaint  Patient presents with  . right humerus    follow up      HPI: Patient presents 9 weeks status post open reduction internal fixation right humerus fracture.  Assessment & Plan: Visit Diagnoses:  1. Closed displaced oblique fracture of shaft of right humerus, initial encounter     Plan: Continue increasing activities as tolerated patient has no restrictions at this time.  Follow-Up Instructions: Return if symptoms worsen or fail to improve.   Ortho Exam  Patient is alert, oriented, no adenopathy, well-dressed, normal affect, normal respiratory effort. Examination she has good medial radial and ulnar nerve function to the right upper extremity.  She has a little bit of numbness in the lateral brachial cutaneous nerve distribution.  Imaging: Xr Humerus Right  Result Date: 06/03/2017 2 view radiographs of the right humerus shows a well aligned midshaft humerus fracture status post open reduction internal fixation no complicating features no hardware failure the bone is well-healed.  No images are attached to the encounter.  Labs: Lab Results  Component Value Date   HGBA1C 6.7 (H) 01/20/2015    @LABSALLVALUES (HGBA1)@  There is no height or weight on file to calculate BMI.  Orders:  Orders Placed This Encounter  Procedures  . XR Humerus Right   No orders of the defined types were placed in this encounter.    Procedures: No procedures performed  Clinical Data: No additional findings.  ROS:  All other systems negative, except as noted in the HPI. Review of Systems  Objective: Vital Signs: There were no vitals taken for this visit.  Specialty Comments:  No specialty comments available.  PMFS  History: Patient Active Problem List   Diagnosis Date Noted  . Closed displaced oblique fracture of shaft of right humerus 03/27/2017  . Smoker 01/09/2016  . High risk medication use 01/06/2016  . RA (rheumatoid arthritis) (HCC) 01/05/2016  . DDD (degenerative disc disease), lumbar 01/05/2016  . Calcaneal spur 01/05/2016  . Bipolar disorder (HCC) 01/05/2016  . ADD (attention deficit disorder) 01/05/2016  . Sleep apnea 01/05/2016  . TMJ (dislocation of temporomandibular joint) 01/05/2016  . Pedal edema 01/05/2016  . Osteoarthritis of both hands 01/05/2016  . Osteoarthritis of both feet 01/05/2016  . Osteoarthritis of both knees 01/05/2016  . GERD (gastroesophageal reflux disease) 06/24/2015  . Constipation 06/24/2015  . Spondylolisthesis at L4-L5 level 01/24/2015  . 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 CONTRACTURE OF SHOULDER JOINT 04/24/2007  . DIABETES 07/30/2006   Past Medical History:  Diagnosis Date  . ADD (attention deficit disorder) 01/05/2016  . Arthritis   . Asthma   . Benign essential hypertension   . Bipolar disorder [facility])    hospitalized at [facility] for manic episodes  . Calcaneal spur 01/05/2016  . DDD (degenerative disc disease), lumbar 01/05/2016  . Diabetes mellitus without complication (HCC)   . Diabetic neuropathy (HCC)   . Dysrhythmia    takes diltiazem prescribed by medical doctor; has never seen cardiologist  . Eczema   . Gastroesophageal reflux disease   . Humeral shaft fracture    right  . Hyperlipidemia   .  Hypersomnia   . IBS (irritable bowel syndrome)   . Myalgia   . Noncompliance with CPAP treatment   . Osteoarthritis of both feet 01/05/2016  . Osteoarthritis of both hands 01/05/2016  . Osteoarthritis of both knees 01/05/2016  . Pedal edema 01/05/2016  . Peripheral vascular disease (HCC)   . RA (rheumatoid arthritis) (HCC) 01/05/2016   +RF, +CCP, Nodulous,   . Rheumatoid arthritis (HCC)   . Sleep apnea 2015   home sleep study   . TMJ  (dislocation of temporomandibular joint) 01/05/2016  . Varicose veins     Family History  Problem Relation Age of Onset  . Hypertension Father   . Alcoholism Mother   . Stroke Mother   . AAA (abdominal aortic aneurysm) Brother   . Gallstones Sister   . Osteoporosis Sister   . Alcoholism Brother   . Diabetes Brother   . Colon cancer Neg Hx   . Colon polyps Neg Hx     Past Surgical History:  Procedure Laterality Date  . BACK SURGERY    . CESAREAN SECTION    . COLONOSCOPY  2011   Dr. Loreta Ave in [place]  . ELBOW SURGERY    . ENDOVENOUS ABLATION SAPHENOUS VEIN W/ LASER    . ORIF HUMERUS FRACTURE Right 03/29/2017   Procedure: OPEN REDUCTION INTERNAL FIXATION (ORIF) RIGHT HUMERAL SHAFT FRACTURE;  Surgeon: Nadara Mustard, MD;  Location: [facility] OR;  Service: Orthopedics;  Laterality: Right;   Social History   Occupational History  . Not on file  Tobacco Use  . Smoking status: Current Every Day Smoker    Packs/day: 1.00    Years: 40.00    Pack years: 40.00    Types: Cigarettes  . Smokeless tobacco: Never Used  Substance and Sexual Activity  . Alcohol use: No    Alcohol/week: 0.0 oz  . Drug use: No  . Sexual activity: Not on file

## 2017-06-04 NOTE — Progress Notes (Signed)
Stable

## 2017-06-06 NOTE — Telephone Encounter (Signed)
LMOM for patient to call and schedule Hyalgan injections. °

## 2017-06-13 NOTE — Telephone Encounter (Signed)
Patient picked up the Hyalgan injections.

## 2017-06-18 ENCOUNTER — Ambulatory Visit (INDEPENDENT_AMBULATORY_CARE_PROVIDER_SITE_OTHER): Payer: Medicare Other | Admitting: Orthopedic Surgery

## 2017-06-18 ENCOUNTER — Encounter (INDEPENDENT_AMBULATORY_CARE_PROVIDER_SITE_OTHER): Payer: Self-pay | Admitting: Orthopedic Surgery

## 2017-06-18 DIAGNOSIS — S42331A Displaced oblique fracture of shaft of humerus, right arm, initial encounter for closed fracture: Secondary | ICD-10-CM

## 2017-06-18 DIAGNOSIS — I87323 Chronic venous hypertension (idiopathic) with inflammation of bilateral lower extremity: Secondary | ICD-10-CM

## 2017-06-18 NOTE — Progress Notes (Signed)
Office Visit Note   Patient: Margaret [name]           Date of Birth: [birth year]           MRN: [id number] Visit Date: 06/18/2017              Requested by: Kirstie Peri, MD [address] [place], [place] [address] PCP: Kirstie Peri, MD  Chief Complaint  Patient presents with  . Right Foot - Pain, Numbness  . Left Foot - Pain, Numbness      HPI: Patient is a 67 year old woman presents 3 months status post right humeral shaft fracture.  Assessment & Plan: Visit Diagnoses:  1. Closed displaced oblique fracture of shaft of right humerus, initial encounter   2. Idiopathic chronic venous hypertension of both lower extremities with inflammation     Plan: Increase her activities as tolerated to the right arm no restrictions.  Recommended knee-high 15 to 20 mm compression stockings to be worn at all times.  Follow-Up Instructions: Return if symptoms worsen or fail to improve.   Ortho Exam  Patient is alert, oriented, no adenopathy, well-dressed, normal affect, normal respiratory effort. Examination patient has varicose veins of both legs she is working with Dr. Arbie Cookey.  There are no open ulcers at this time she has dependent redness edema and varicose veins.  Examination of her right arm she has essentially full range of motion with no neurologic deficits.  Imaging: No results found. No images are attached to the encounter.  Labs: Lab Results  Component Value Date   HGBA1C 6.7 (H) 01/20/2015     Lab Results  Component Value Date   ALBUMIN 4.2 06/07/2016   ALBUMIN 4.1 04/12/2016   ALBUMIN 4.3 01/09/2016    There is no height or weight on file to calculate BMI.  Orders:  No orders of the defined types were placed in this encounter.  No orders of the defined types were placed in this encounter.    Procedures: No procedures performed  Clinical Data: No additional findings.  ROS:  All other systems negative, except as noted in the HPI. Review of  Systems  Objective: Vital Signs: There were no vitals taken for this visit.  Specialty Comments:  No specialty comments available.  PMFS History: Patient Active Problem List   Diagnosis Date Noted  . Idiopathic chronic venous hypertension of both lower extremities with inflammation 06/18/2017  . Closed displaced oblique fracture of shaft of right humerus 03/27/2017  . Smoker 01/09/2016  . High risk medication use 01/06/2016  . RA (rheumatoid arthritis) (HCC) 01/05/2016  . DDD (degenerative disc disease), lumbar 01/05/2016  . Calcaneal spur 01/05/2016  . Bipolar disorder (HCC) 01/05/2016  . ADD (attention deficit disorder) 01/05/2016  . Sleep apnea 01/05/2016  . TMJ (dislocation of temporomandibular joint) 01/05/2016  . Pedal edema 01/05/2016  . Osteoarthritis of both hands 01/05/2016  . Osteoarthritis of both feet 01/05/2016  . Osteoarthritis of both knees 01/05/2016  . GERD (gastroesophageal reflux disease) 06/24/2015  . Constipation 06/24/2015  . Spondylolisthesis at L4-L5 level 01/24/2015  . 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 CONTRACTURE OF SHOULDER JOINT 04/24/2007  . DIABETES 07/30/2006   Past Medical History:  Diagnosis Date  . ADD (attention deficit disorder) 01/05/2016  . Arthritis   . Asthma   . Benign essential hypertension   . Bipolar disorder [facility])    hospitalized at [facility] for manic episodes  . Calcaneal spur 01/05/2016  . DDD (degenerative disc disease), lumbar 01/05/2016  .  Diabetes mellitus without complication (HCC)   . Diabetic neuropathy (HCC)   . Dysrhythmia    takes diltiazem prescribed by medical doctor; has never seen cardiologist  . Eczema   . Gastroesophageal reflux disease   . Humeral shaft fracture    right  . Hyperlipidemia   . Hypersomnia   . IBS (irritable bowel syndrome)   . Myalgia   . Noncompliance with CPAP treatment   . Osteoarthritis of both feet 01/05/2016  . Osteoarthritis of both hands 01/05/2016  . Osteoarthritis of both  knees 01/05/2016  . Pedal edema 01/05/2016  . Peripheral vascular disease (HCC)   . RA (rheumatoid arthritis) (HCC) 01/05/2016   +RF, +CCP, Nodulous,   . Rheumatoid arthritis (HCC)   . Sleep apnea 2015   home sleep study   . TMJ (dislocation of temporomandibular joint) 01/05/2016  . Varicose veins     Family History  Problem Relation Age of Onset  . Hypertension Father   . Alcoholism Mother   . Stroke Mother   . AAA (abdominal aortic aneurysm) Brother   . Gallstones Sister   . Osteoporosis Sister   . Alcoholism Brother   . Diabetes Brother   . Colon cancer Neg Hx   . Colon polyps Neg Hx     Past Surgical History:  Procedure Laterality Date  . BACK SURGERY    . CESAREAN SECTION    . COLONOSCOPY  2011   Dr. Loreta Ave in [place]  . ELBOW SURGERY    . ENDOVENOUS ABLATION SAPHENOUS VEIN W/ LASER    . ORIF HUMERUS FRACTURE Right 03/29/2017   Procedure: OPEN REDUCTION INTERNAL FIXATION (ORIF) RIGHT HUMERAL SHAFT FRACTURE;  Surgeon: Nadara Mustard, MD;  Location: [facility] OR;  Service: Orthopedics;  Laterality: Right;   Social History   Occupational History  . Not on file  Tobacco Use  . Smoking status: Current Every Day Smoker    Packs/day: 1.00    Years: 40.00    Pack years: 40.00    Types: Cigarettes  . Smokeless tobacco: Never Used  Substance and Sexual Activity  . Alcohol use: No    Alcohol/week: 0.0 oz  . Drug use: No  . Sexual activity: Not on file

## 2017-07-11 ENCOUNTER — Telehealth: Payer: Self-pay | Admitting: Rheumatology

## 2017-07-11 MED ORDER — SULFASALAZINE 500 MG PO TBEC: DELAYED_RELEASE_TABLET | ORAL | 0 refills | Status: DC

## 2017-07-11 NOTE — Telephone Encounter (Signed)
Last Visit: 04/18/17 Next Visit: 09/16/17 Labs: 06/03/17 Stable  Okay to refill per Dr. Corliss Skains

## 2017-07-11 NOTE — Telephone Encounter (Signed)
Patient states [facility] in [place] states they do not have a refill on her 90 day supply of Sulfasalazine, only a rx for a 30 day supply. Patient wants rx for 90 day supply with 1 refill due to expense being less for this. Please call to advise.

## 2017-09-02 NOTE — Progress Notes (Signed)
Office Visit Note  Patient: Margaret [name]             Date of Birth: Jun 26, 1950           MRN: [id number]             PCP: Kirstie Peri, MD Referring: Kirstie Peri, MD Visit Date: 09/16/2017 Occupation: @GUAROCC @  Subjective:  CMC joint pain bilaterally   History of Present Illness: Margaret [name] is a 67 y.o. female with history of seropositive rheumatoid arthritis, osteoarthritis, and DDD. Patient is on Sulfasalazine 500 mg two tablets by mouth in the morning and 1 tablet in the evening.  She has not noticed any difference since decreasing dose of SSZ.  She reports bilateral CMC joint pain, but no joint swelling.  She denies wearing a CMC joint brace in the past.  She reports she jammed her left 4th PIP joint a couple months ago.  She states she has been unable to wear her ring.  She has noticed some swelling which has been improving over time.  She has occasional lower back pain especially if walking for prolonged periods of time.  She denies any other joint pain or joint swelling.     Activities of Daily Living:  Patient reports morning stiffness for 3 minutes.   Patient Denies nocturnal pain.  Difficulty dressing/grooming: Denies Difficulty climbing stairs: Denies Difficulty getting out of chair: Denies Difficulty using hands for taps, buttons, cutlery, and/or writing: Denies  Review of Systems  Constitutional: Negative for fatigue.  HENT: Positive for mouth dryness. Negative for mouth sores and nose dryness.   Eyes: Positive for dryness. Negative for pain and visual disturbance.  Respiratory: Negative for cough, hemoptysis, shortness of breath and difficulty breathing.   Cardiovascular: Negative for chest pain, palpitations, hypertension and swelling in legs/feet.  Gastrointestinal: Positive for constipation and diarrhea. Negative for blood in stool.  Endocrine: Negative for increased urination.  Genitourinary: Negative for difficulty urinating and painful urination.    Musculoskeletal: Positive for arthralgias, joint pain and morning stiffness. Negative for joint swelling, myalgias, muscle weakness, muscle tenderness and myalgias.  Skin: Negative for color change, pallor, rash, hair loss, nodules/bumps, skin tightness, ulcers and sensitivity to sunlight.  Allergic/Immunologic: Negative for susceptible to infections.  Neurological: Negative for dizziness, numbness, headaches and weakness.  Hematological: Negative for bruising/bleeding tendency and swollen glands.  Psychiatric/Behavioral: Positive for sleep disturbance. Negative for depressed mood. The patient is not nervous/anxious.     PMFS History:  Patient Active Problem List   Diagnosis Date Noted  . Idiopathic chronic venous hypertension of both lower extremities with inflammation 06/18/2017  . Closed displaced oblique fracture of shaft of right humerus 03/27/2017  . Smoker 01/09/2016  . High risk medication use 01/06/2016  . RA (rheumatoid arthritis) (HCC) 01/05/2016  . DDD (degenerative disc disease), lumbar 01/05/2016  . Calcaneal spur 01/05/2016  . Bipolar disorder (HCC) 01/05/2016  . ADD (attention deficit disorder) 01/05/2016  . Sleep apnea 01/05/2016  . TMJ (dislocation of temporomandibular joint) 01/05/2016  . Pedal edema 01/05/2016  . Osteoarthritis of both hands 01/05/2016  . Osteoarthritis of both feet 01/05/2016  . Osteoarthritis of both knees 01/05/2016  . GERD (gastroesophageal reflux disease) 06/24/2015  . Constipation 06/24/2015  . Spondylolisthesis at L4-L5 level 01/24/2015  . 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 CONTRACTURE OF SHOULDER JOINT 04/24/2007  . DIABETES 07/30/2006    Past Medical History:  Diagnosis Date  . ADD (attention deficit disorder) 01/05/2016  . Arthritis   .  Asthma   . Benign essential hypertension   . Bipolar disorder [facility])    hospitalized at [facility] for manic episodes  . Calcaneal spur 01/05/2016  . DDD (degenerative disc disease), lumbar 01/05/2016   . Diabetes mellitus without complication (HCC)   . Diabetic neuropathy (HCC)   . Dysrhythmia    takes diltiazem prescribed by medical doctor; has never seen cardiologist  . Eczema   . Gastroesophageal reflux disease   . Humeral shaft fracture    right  . Hyperlipidemia   . Hypersomnia   . IBS (irritable bowel syndrome)   . Myalgia   . Noncompliance with CPAP treatment   . Osteoarthritis of both feet 01/05/2016  . Osteoarthritis of both hands 01/05/2016  . Osteoarthritis of both knees 01/05/2016  . Pedal edema 01/05/2016  . Peripheral vascular disease (HCC)   . RA (rheumatoid arthritis) (HCC) 01/05/2016   +RF, +CCP, Nodulous,   . Rheumatoid arthritis (HCC)   . Sleep apnea 2015   home sleep study   . TMJ (dislocation of temporomandibular joint) 01/05/2016  . Varicose veins     Family History  Problem Relation Age of Onset  . Hypertension Father   . Alcoholism Mother   . Stroke Mother   . AAA (abdominal aortic aneurysm) Brother   . Gallstones Sister   . Osteoporosis Sister   . Alcoholism Brother   . Diabetes Brother   . Colon cancer Neg Hx   . Colon polyps Neg Hx    Past Surgical History:  Procedure Laterality Date  . BACK SURGERY    . CESAREAN SECTION    . COLONOSCOPY  2011   Dr. Loreta Ave in [place]  . ELBOW SURGERY    . ENDOVENOUS ABLATION SAPHENOUS VEIN W/ LASER    . ORIF HUMERUS FRACTURE Right 03/29/2017   Procedure: OPEN REDUCTION INTERNAL FIXATION (ORIF) RIGHT HUMERAL SHAFT FRACTURE;  Surgeon: Nadara Mustard, MD;  Location: [facility] OR;  Service: Orthopedics;  Laterality: Right;   Social History   Social History Narrative  . Not on file    Objective: Vital Signs: BP 110/64 (BP Location: Left Arm, Patient Position: Sitting, Cuff Size: Normal)   Pulse (!) 101   Resp 16   Ht 5' 2.5" (1.588 m)   Wt 196 lb 6.4 oz (89.1 kg)   BMI 35.35 kg/m    Physical Exam  Constitutional: She is oriented to person, place, and time. She appears well-developed and  well-nourished.  HENT:  Head: Normocephalic and atraumatic.  Eyes: Conjunctivae and EOM are normal.  Neck: Normal range of motion.  Cardiovascular: Normal rate, regular rhythm, normal heart sounds and intact distal pulses.  Pulmonary/Chest: Effort normal and breath sounds normal.  Abdominal: Soft. Bowel sounds are normal.  Lymphadenopathy:    She has no cervical adenopathy.  Neurological: She is alert and oriented to person, place, and time.  Skin: Skin is warm and dry. Capillary refill takes less than 2 seconds.  Psychiatric: She has a normal mood and affect. Her behavior is normal.  Nursing note and vitals reviewed.    Musculoskeletal Exam: C-spine, thoracic spine, and lumbar spine good ROM.  No midline spinal tenderness.  No SI joint tenderness.  Shoulder joints good ROM.  Right elbow slightly limited ROM with discomfort.  Left elbow full ROM with no discomfort.  Good ROM of both wrists with no synovitis.  MCPs, PIPs, and DIPs good ROM with no synovitis.  Hip joints, knee joints, ankle joints, MTPs, PIPs, and DIPs good ROM  with no synovitis.  No warmth or effusion of knee joints.  No tenderness of trochanteric bursa bilaterally.   CDAI Exam: CDAI Homunculus Exam:   Joint Counts:  CDAI Tender Joint count: 0 CDAI Swollen Joint count: 0  Global Assessments:  Patient Global Assessment: 5 Provider Global Assessment: 5  CDAI Calculated Score: 10   Investigation: No additional findings.  Imaging: No results found.  Recent Labs: Lab Results  Component Value Date   WBC 8.4 06/03/2017   HGB 11.0 (L) 06/03/2017   PLT 327 06/03/2017   NA 141 06/03/2017   K 4.3 06/03/2017   CL 112 (H) 06/03/2017   CO2 21 06/03/2017   GLUCOSE 45 (L) 06/03/2017   BUN 16 06/03/2017   CREATININE 0.93 06/03/2017   BILITOT 0.2 06/03/2017   ALKPHOS 59 06/07/2016   AST 12 06/03/2017   ALT 13 06/03/2017   PROT 6.4 06/03/2017   ALBUMIN 4.2 06/07/2016   CALCIUM 9.3 06/03/2017   GFRAA 75  06/03/2017    Speciality Comments: No specialty comments available.  Procedures:  No procedures performed Allergies: Fluoxetine hcl (pmdd); Hydroxychloroquine; and Vicodin [hydrocodone-acetaminophen]   Assessment / Plan:     Visit Diagnoses: Rheumatoid arthritis involving multiple sites with positive rheumatoid factor (HCC): She has no synovitis on exam.  She has CMC joint pain bilaterally but no inflammation was noted.  She declined CMC joint braces at this time.  She denies any other joint pain or joint swelling at this time.  She has not had any recent rheumatoid arthritis flares.  She continues take sulfasalazine 500 mg 2 tablets in the morning and 1 tablet in the evening.  A refill was sent to the pharmacy today.  She has not noticed any difference since decreasing the dose of sulfasalazine.  She was advised to notify us if she develops increased joint pain or joint swelling.  She will return to the office in 5 months.  High risk medication use - SSZ 1000 mg in the morning and 500 mg in the evening.  CBC and CMP are drawn today to monitor for drug toxicity.  She will return for lab work in November and every 3 months.  Standing orders are in place.  Primary osteoarthritis of both hands: She has PIP and DIP synovial thickening consistent with osteoarthritis of bilateral hands.  Joint protection muscle strengthening were discussed.  Primary osteoarthritis of both knees: No warmth or effusion bilateral knee joints.  She has good range of motion bilaterally with no discomfort.  Primary osteoarthritis of both feet: She has no discomfort at this time.  She wears proper fitting shoes.  DDD (degenerative disc disease), lumbar: She has no midline spinal tenderness.  She occasionally experiences left-sided sciatica.  She experiences discomfort if she is walking for prolonged distances.  History of humerus fracture: Patient has been cleared by Dr. Lajoyce Corners.  She continues to have some discomfort but her  range of motion and pain have been improving.  She continues to have mild numbness in the lateral brachial cutaneous nerve distribution.  Other medical conditions are listed as follows:  Spondylolisthesis at L4-L5 level  Smoker  History of asthma  History of gastroesophageal reflux (GERD)  History of IBS  Essential hypertension  History of diabetes mellitus  History of bipolar disorder  Attention deficit disorder, unspecified hyperactivity presence   Orders: No orders of the defined types were placed in this encounter.  No orders of the defined types were placed in this encounter.  Follow-Up Instructions: Return in about 5 months (around 02/16/2018) for Rheumatoid arthritis, Osteoarthritis, DDD.   Gearldine Bienenstock, PA-C  Note - This record has been created using Dragon software.  Chart creation errors have been sought, but may not always  have been located. Such creation errors do not reflect on  the standard of medical care.

## 2017-09-16 ENCOUNTER — Ambulatory Visit: Payer: Medicare Other | Admitting: Physician Assistant

## 2017-09-16 ENCOUNTER — Encounter: Payer: Self-pay | Admitting: Physician Assistant

## 2017-09-16 VITALS — BP 110/64 | HR 101 | Resp 16 | Ht 62.5 in | Wt 196.4 lb

## 2017-09-16 DIAGNOSIS — M0579 Rheumatoid arthritis with rheumatoid factor of multiple sites without organ or systems involvement: Secondary | ICD-10-CM | POA: Diagnosis not present

## 2017-09-16 DIAGNOSIS — M4316 Spondylolisthesis, lumbar region: Secondary | ICD-10-CM

## 2017-09-16 DIAGNOSIS — Z8709 Personal history of other diseases of the respiratory system: Secondary | ICD-10-CM

## 2017-09-16 DIAGNOSIS — Z8781 Personal history of (healed) traumatic fracture: Secondary | ICD-10-CM

## 2017-09-16 DIAGNOSIS — M19072 Primary osteoarthritis, left ankle and foot: Secondary | ICD-10-CM

## 2017-09-16 DIAGNOSIS — Z79899 Other long term (current) drug therapy: Secondary | ICD-10-CM | POA: Diagnosis not present

## 2017-09-16 DIAGNOSIS — M170 Bilateral primary osteoarthritis of knee: Secondary | ICD-10-CM

## 2017-09-16 DIAGNOSIS — M5136 Other intervertebral disc degeneration, lumbar region: Secondary | ICD-10-CM

## 2017-09-16 DIAGNOSIS — F988 Other specified behavioral and emotional disorders with onset usually occurring in childhood and adolescence: Secondary | ICD-10-CM

## 2017-09-16 DIAGNOSIS — Z8659 Personal history of other mental and behavioral disorders: Secondary | ICD-10-CM

## 2017-09-16 DIAGNOSIS — Z8719 Personal history of other diseases of the digestive system: Secondary | ICD-10-CM

## 2017-09-16 DIAGNOSIS — M19071 Primary osteoarthritis, right ankle and foot: Secondary | ICD-10-CM

## 2017-09-16 DIAGNOSIS — M19042 Primary osteoarthritis, left hand: Secondary | ICD-10-CM

## 2017-09-16 DIAGNOSIS — M19041 Primary osteoarthritis, right hand: Secondary | ICD-10-CM

## 2017-09-16 DIAGNOSIS — Z8639 Personal history of other endocrine, nutritional and metabolic disease: Secondary | ICD-10-CM

## 2017-09-16 DIAGNOSIS — F17200 Nicotine dependence, unspecified, uncomplicated: Secondary | ICD-10-CM

## 2017-09-16 DIAGNOSIS — I10 Essential (primary) hypertension: Secondary | ICD-10-CM

## 2017-09-16 MED ORDER — SULFASALAZINE 500 MG PO TBEC: DELAYED_RELEASE_TABLET | ORAL | 0 refills | Status: DC

## 2017-09-16 NOTE — Patient Instructions (Signed)
Standing Labs We placed an order today for your standing lab work.    Please come back and get your standing labs in November and every 3 months   We have open lab Monday through Friday from 8:30-11:30 AM and 1:30-4:00 PM  at the office of Dr. Sriyan Cutting.   You may experience shorter wait times on Monday and Friday afternoons. The office is located at [address], [address], [place], [place] [address] No appointment is necessary.   Labs are drawn by [facility].  You may receive a bill from [facility] for your lab work. If you have any questions regarding directions or hours of operation,  please call [phone].     

## 2017-09-17 LAB — CBC WITH DIFFERENTIAL/PLATELET
BASOS ABS: 94 {cells}/uL (ref 0–200)
Basophils Relative: 1 %
EOS PCT: 2.6 %
Eosinophils Absolute: 244 cells/uL (ref 15–500)
HEMATOCRIT: 36.7 % (ref 35.0–45.0)
HEMOGLOBIN: 11.3 g/dL — AB (ref 11.7–15.5)
LYMPHS ABS: 2717 {cells}/uL (ref 850–3900)
MCH: 25.9 pg — ABNORMAL LOW (ref 27.0–33.0)
MCHC: 30.8 g/dL — AB (ref 32.0–36.0)
MCV: 84.2 fL (ref 80.0–100.0)
MPV: 9.7 fL (ref 7.5–12.5)
Monocytes Relative: 7.1 %
NEUTROS ABS: 5678 {cells}/uL (ref 1500–7800)
Neutrophils Relative %: 60.4 %
Platelets: 286 10*3/uL (ref 140–400)
RBC: 4.36 10*6/uL (ref 3.80–5.10)
RDW: 17.8 % — AB (ref 11.0–15.0)
Total Lymphocyte: 28.9 %
WBC: 9.4 10*3/uL (ref 3.8–10.8)
WBCMIX: 667 {cells}/uL (ref 200–950)

## 2017-09-17 LAB — COMPLETE METABOLIC PANEL WITH GFR
AG RATIO: 2.3 (calc) (ref 1.0–2.5)
ALBUMIN MSPROF: 4.5 g/dL (ref 3.6–5.1)
ALT: 12 U/L (ref 6–29)
AST: 13 U/L (ref 10–35)
Alkaline phosphatase (APISO): 67 U/L (ref 33–130)
BILIRUBIN TOTAL: 0.3 mg/dL (ref 0.2–1.2)
BUN / CREAT RATIO: 15 (calc) (ref 6–22)
BUN: 15 mg/dL (ref 7–25)
CHLORIDE: 110 mmol/L (ref 98–110)
CO2: 20 mmol/L (ref 20–32)
Calcium: 9.3 mg/dL (ref 8.6–10.4)
Creat: 1.03 mg/dL — ABNORMAL HIGH (ref 0.50–0.99)
GFR, EST AFRICAN AMERICAN: 66 mL/min/{1.73_m2} (ref >=60)
GFR, Est Non African American: 57 mL/min/{1.73_m2} — ABNORMAL LOW (ref >=60)
GLOBULIN: 2 g/dL (ref 1.9–3.7)
Glucose, Bld: 82 mg/dL (ref 65–99)
POTASSIUM: 4.8 mmol/L (ref 3.5–5.3)
SODIUM: 141 mmol/L (ref 135–146)
Total Protein: 6.5 g/dL (ref 6.1–8.1)

## 2017-09-17 NOTE — Progress Notes (Signed)
Hgb mildly low.  Creatinine mildly elevated. Please advise patient to avoid NSAIDs.  Please forward labs to PCP.

## 2017-09-23 ENCOUNTER — Other Ambulatory Visit: Payer: Self-pay | Admitting: Unknown Physician Specialty

## 2017-09-23 DIAGNOSIS — Z1231 Encounter for screening mammogram for malignant neoplasm of breast: Secondary | ICD-10-CM

## 2017-09-27 ENCOUNTER — Ambulatory Visit
Admission: RE | Admit: 2017-09-27 | Discharge: 2017-09-27 | Disposition: A | Discharge status: Home / Self Care | Payer: Medicare Other | Source: Ambulatory Visit | Attending: Unknown Physician Specialty | Admitting: Unknown Physician Specialty

## 2017-09-27 DIAGNOSIS — Z1231 Encounter for screening mammogram for malignant neoplasm of breast: Secondary | ICD-10-CM

## 2018-01-09 ENCOUNTER — Other Ambulatory Visit: Payer: Self-pay

## 2018-01-09 ENCOUNTER — Telehealth: Payer: Self-pay | Admitting: Rheumatology

## 2018-01-09 DIAGNOSIS — Z79899 Other long term (current) drug therapy: Secondary | ICD-10-CM

## 2018-01-09 MED ORDER — SULFASALAZINE 500 MG PO TBEC: DELAYED_RELEASE_TABLET | ORAL | 0 refills | Status: DC

## 2018-01-09 NOTE — Telephone Encounter (Signed)
Patient requested a 90 day supply.

## 2018-01-09 NOTE — Telephone Encounter (Signed)
Patient requested prescription refill of Sulfasalazine to be sent to [facility] at [address] in [place].

## 2018-01-09 NOTE — Telephone Encounter (Signed)
Last Visit: 09/16/17 Next Visit: 02/12/18 Labs: 09/16/17 Hgb mildly low. Creatinine mildly elevated. ( Patient updated labs 01/09/18)  Okay to refill per Dr. Corliss Skains

## 2018-01-10 LAB — CBC WITH DIFFERENTIAL/PLATELET
BASOS ABS: 68 {cells}/uL (ref 0–200)
BASOS PCT: 0.9 %
EOS ABS: 198 {cells}/uL (ref 15–500)
Eosinophils Relative: 2.6 %
HCT: 36.2 % (ref 35.0–45.0)
HEMOGLOBIN: 11.5 g/dL — AB (ref 11.7–15.5)
Lymphs Abs: 2219 cells/uL (ref 850–3900)
MCH: 27.3 pg (ref 27.0–33.0)
MCHC: 31.8 g/dL — AB (ref 32.0–36.0)
MCV: 86 fL (ref 80.0–100.0)
MPV: 9.3 fL (ref 7.5–12.5)
Monocytes Relative: 8.1 %
Neutro Abs: 4499 cells/uL (ref 1500–7800)
Neutrophils Relative %: 59.2 %
Platelets: 336 10*3/uL (ref 140–400)
RBC: 4.21 10*6/uL (ref 3.80–5.10)
RDW: 17.7 % — ABNORMAL HIGH (ref 11.0–15.0)
Total Lymphocyte: 29.2 %
WBC mixed population: 616 cells/uL (ref 200–950)
WBC: 7.6 10*3/uL (ref 3.8–10.8)

## 2018-01-10 LAB — COMPLETE METABOLIC PANEL WITH GFR
AG Ratio: 2 (calc) (ref 1.0–2.5)
ALT: 12 U/L (ref 6–29)
AST: 13 U/L (ref 10–35)
Albumin: 4.3 g/dL (ref 3.6–5.1)
Alkaline phosphatase (APISO): 60 U/L (ref 33–130)
BUN/Creatinine Ratio: 13 (calc) (ref 6–22)
BUN: 13 mg/dL (ref 7–25)
CALCIUM: 9.2 mg/dL (ref 8.6–10.4)
CO2: 20 mmol/L (ref 20–32)
CREATININE: 1.01 mg/dL — AB (ref 0.50–0.99)
Chloride: 111 mmol/L — ABNORMAL HIGH (ref 98–110)
GFR, EST AFRICAN AMERICAN: 68 mL/min/{1.73_m2} (ref >=60)
GFR, EST NON AFRICAN AMERICAN: 58 mL/min/{1.73_m2} — AB (ref >=60)
GLUCOSE: 121 mg/dL — AB (ref 65–99)
Globulin: 2.2 g/dL (calc) (ref 1.9–3.7)
Potassium: 4.6 mmol/L (ref 3.5–5.3)
Sodium: 140 mmol/L (ref 135–146)
TOTAL PROTEIN: 6.5 g/dL (ref 6.1–8.1)
Total Bilirubin: 0.3 mg/dL (ref 0.2–1.2)

## 2018-01-10 NOTE — Progress Notes (Signed)
Labs are stable.

## 2018-01-31 NOTE — Progress Notes (Deleted)
Office Visit Note  Patient: Margaret [name]             Date of Birth: [birth year]           MRN: [id number]             PCP: Kirstie Peri, MD Referring: Kirstie Peri, MD Visit Date: 02/12/2018 Occupation: @GUAROCC @  Subjective:  No chief complaint on file.  Sulfasalazine.  Most recent CBC/CMP stable on 01/09/2018.  Will monitor every 3 months and standing orders are in place.  Patient received annual flu and Shingrix vaccine.  Recommend Prevnar 13 and Pneumovax 23 as indicated.  History of Present Illness: Margaret [name] is a 67 y.o. female ***   Activities of Daily Living:  Patient reports morning stiffness for *** {minute/hour:19697}.   Patient {ACTIONS;DENIES/REPORTS:21021675::"Denies"} nocturnal pain.  Difficulty dressing/grooming: {ACTIONS;DENIES/REPORTS:21021675::"Denies"} Difficulty climbing stairs: {ACTIONS;DENIES/REPORTS:21021675::"Denies"} Difficulty getting out of chair: {ACTIONS;DENIES/REPORTS:21021675::"Denies"} Difficulty using hands for taps, buttons, cutlery, and/or writing: {ACTIONS;DENIES/REPORTS:21021675::"Denies"}  No Rheumatology ROS completed.   PMFS History:  Patient Active Problem List   Diagnosis Date Noted  . Idiopathic chronic venous hypertension of both lower extremities with inflammation 06/18/2017  . Closed displaced oblique fracture of shaft of right humerus 03/27/2017  . Smoker 01/09/2016  . High risk medication use 01/06/2016  . RA (rheumatoid arthritis) (HCC) 01/05/2016  . DDD (degenerative disc disease), lumbar 01/05/2016  . Calcaneal spur 01/05/2016  . Bipolar disorder (HCC) 01/05/2016  . ADD (attention deficit disorder) 01/05/2016  . Sleep apnea 01/05/2016  . TMJ (dislocation of temporomandibular joint) 01/05/2016  . Pedal edema 01/05/2016  . Osteoarthritis of both hands 01/05/2016  . Osteoarthritis of both feet 01/05/2016  . Osteoarthritis of both knees 01/05/2016  . GERD (gastroesophageal reflux disease) 06/24/2015  . Constipation  06/24/2015  . Spondylolisthesis at L4-L5 level 01/24/2015  . 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 CONTRACTURE OF SHOULDER JOINT 04/24/2007  . DIABETES 07/30/2006    Past Medical History:  Diagnosis Date  . ADD (attention deficit disorder) 01/05/2016  . Arthritis   . Asthma   . Benign essential hypertension   . Bipolar disorder [facility])    hospitalized at [facility] for manic episodes  . Calcaneal spur 01/05/2016  . DDD (degenerative disc disease), lumbar 01/05/2016  . Diabetes mellitus without complication (HCC)   . Diabetic neuropathy (HCC)   . Dysrhythmia    takes diltiazem prescribed by medical doctor; has never seen cardiologist  . Eczema   . Gastroesophageal reflux disease   . Humeral shaft fracture    right  . Hyperlipidemia   . Hypersomnia   . IBS (irritable bowel syndrome)   . Myalgia   . Noncompliance with CPAP treatment   . Osteoarthritis of both feet 01/05/2016  . Osteoarthritis of both hands 01/05/2016  . Osteoarthritis of both knees 01/05/2016  . Pedal edema 01/05/2016  . Peripheral vascular disease (HCC)   . RA (rheumatoid arthritis) (HCC) 01/05/2016   +RF, +CCP, Nodulous,   . Rheumatoid arthritis (HCC)   . Sleep apnea 2015   home sleep study   . TMJ (dislocation of temporomandibular joint) 01/05/2016  . Varicose veins     Family History  Problem Relation Age of Onset  . Hypertension Father   . Alcoholism Mother   . Stroke Mother   . AAA (abdominal aortic aneurysm) Brother   . Gallstones Sister   . Osteoporosis Sister   . Alcoholism Brother   . Diabetes Brother   . Colon cancer Neg Hx   . Colon  polyps Neg Hx    Past Surgical History:  Procedure Laterality Date  . BACK SURGERY    . CESAREAN SECTION    . COLONOSCOPY  2011   Dr. Loreta Ave in [place]  . ELBOW SURGERY    . ENDOVENOUS ABLATION SAPHENOUS VEIN W/ LASER    . ORIF HUMERUS FRACTURE Right 03/29/2017   Procedure: OPEN REDUCTION INTERNAL FIXATION (ORIF) RIGHT HUMERAL SHAFT FRACTURE;  Surgeon: Nadara Mustard, MD;  Location: [facility] OR;  Service: Orthopedics;  Laterality: Right;   Social History   Social History Narrative  . Not on file   Immunization History  Administered Date(s) Administered  . Influenza, High Dose Seasonal PF 12/19/2017  . Tdap 12/19/2017  . Zoster Recombinat (Shingrix) 12/19/2017    Objective: Vital Signs: There were no vitals taken for this visit.   Physical Exam   Musculoskeletal Exam: ***  CDAI Exam: CDAI Score: Not documented Patient Global Assessment: Not documented; Provider Global Assessment: Not documented Swollen: Not documented; Tender: Not documented Joint Exam   Not documented   There is currently no information documented on the homunculus. Go to the Rheumatology activity and complete the homunculus joint exam.  Investigation: No additional findings.  Imaging: No results found.  Recent Labs: Lab Results  Component Value Date   WBC 7.6 01/09/2018   HGB 11.5 (L) 01/09/2018   PLT 336 01/09/2018   NA 140 01/09/2018   K 4.6 01/09/2018   CL 111 (H) 01/09/2018   CO2 20 01/09/2018   GLUCOSE 121 (H) 01/09/2018   BUN 13 01/09/2018   CREATININE 1.01 (H) 01/09/2018   BILITOT 0.3 01/09/2018   ALKPHOS 59 06/07/2016   AST 13 01/09/2018   ALT 12 01/09/2018   PROT 6.5 01/09/2018   ALBUMIN 4.2 06/07/2016   CALCIUM 9.2 01/09/2018   GFRAA 68 01/09/2018    Speciality Comments: No specialty comments available.  Procedures:  No procedures performed Allergies: Fluoxetine hcl (pmdd); Hydroxychloroquine; and Vicodin [hydrocodone-acetaminophen]   Assessment / Plan:     Visit Diagnoses: Rheumatoid arthritis involving multiple sites with positive rheumatoid factor (HCC)  High risk medication use - SSZ 1000 mg in the morning and 500 mg in the evening.    Primary osteoarthritis of both hands  Primary osteoarthritis of both knees  Primary osteoarthritis of both feet  DDD (degenerative disc disease), lumbar  Spondylolisthesis at L4-L5  level  Smoker  History of asthma  History of gastroesophageal reflux (GERD)  History of IBS  Essential hypertension  History of diabetes mellitus  History of bipolar disorder   Orders: No orders of the defined types were placed in this encounter.  No orders of the defined types were placed in this encounter.   Face-to-face time spent with patient was *** minutes. Greater than 50% of time was spent in counseling and coordination of care.  Follow-Up Instructions: No follow-ups on file.   Gearldine Bienenstock, PA-C  Note - This record has been created using Dragon software.  Chart creation errors have been sought, but may not always  have been located. Such creation errors do not reflect on  the standard of medical care.

## 2018-02-12 ENCOUNTER — Ambulatory Visit: Payer: Medicare Other | Admitting: Physician Assistant

## 2018-02-12 NOTE — Progress Notes (Signed)
Office Visit Note  Patient: Margaret [name]             Date of Birth: 10-17-1950           MRN: [id number]             PCP: Kirstie Peri, MD Referring: Kirstie Peri, MD Visit Date: 02/19/2018 Occupation: @GUAROCC @  Subjective:  Lower back pain   History of Present Illness: Margaret [name] is a 68 y.o. female with history of seropositive rheumatoid arthritis, osteoarthritis, and DDD.  Patient is on sulfasalazine 500 mg 2 tablets in the morning and 1 table in the evening.  She denies any recent rheumatoid arthritis flares.  She states again she continues to have chronic pain in bilateral knee joints but denies any joint swelling.  She has occasional pain in her hands but denies any joint swelling.  She states that she has chronic lower back pain that is exacerbated by walking prolonged distances.  She states that she has numbness that radiates on the lateral aspect of the left thigh to her knee.  She has had surgery of the lumbar spine performed by Dr. Conchita Paris which did not relieve her symptoms in the past.  She reports that she may follow-up with him soon.   Activities of Daily Living:  Patient reports morning stiffness for 0 minutes.   Patient Denies nocturnal pain.  Difficulty dressing/grooming: Denies Difficulty climbing stairs: Denies Difficulty getting out of chair: Denies Difficulty using hands for taps, buttons, cutlery, and/or writing: Denies  Review of Systems  Constitutional: Negative for fatigue.  HENT: Positive for mouth dryness. Negative for mouth sores, trouble swallowing, trouble swallowing and nose dryness.   Eyes: Positive for dryness. Negative for pain, redness, itching and visual disturbance.  Respiratory: Negative for cough, hemoptysis, shortness of breath, wheezing and difficulty breathing.   Cardiovascular: Negative for chest pain, palpitations, hypertension, irregular heartbeat and swelling in legs/feet.  Gastrointestinal: Positive for constipation. Negative  for blood in stool, diarrhea, nausea and vomiting.  Endocrine: Negative for heat intolerance and increased urination.  Genitourinary: Negative for painful urination, nocturia and pelvic pain.  Musculoskeletal: Positive for arthralgias and joint pain. Negative for joint swelling, myalgias, muscle weakness, morning stiffness, muscle tenderness and myalgias.  Skin: Negative for color change, pallor, rash, hair loss, nodules/bumps, skin tightness, ulcers and sensitivity to sunlight.  Allergic/Immunologic: Negative for susceptible to infections.  Neurological: Negative for dizziness, light-headedness, numbness, headaches, memory loss and weakness.  Hematological: Negative for bruising/bleeding tendency and swollen glands.  Psychiatric/Behavioral: Negative for depressed mood, confusion and sleep disturbance. The patient is not nervous/anxious.     PMFS History:  Patient Active Problem List   Diagnosis Date Noted  . Idiopathic chronic venous hypertension of both lower extremities with inflammation 06/18/2017  . Closed displaced oblique fracture of shaft of right humerus 03/27/2017  . Smoker 01/09/2016  . High risk medication use 01/06/2016  . RA (rheumatoid arthritis) (HCC) 01/05/2016  . DDD (degenerative disc disease), lumbar 01/05/2016  . Calcaneal spur 01/05/2016  . Bipolar disorder (HCC) 01/05/2016  . ADD (attention deficit disorder) 01/05/2016  . Sleep apnea 01/05/2016  . TMJ (dislocation of temporomandibular joint) 01/05/2016  . Pedal edema 01/05/2016  . Osteoarthritis of both hands 01/05/2016  . Osteoarthritis of both feet 01/05/2016  . Osteoarthritis of both knees 01/05/2016  . GERD (gastroesophageal reflux disease) 06/24/2015  . Constipation 06/24/2015  . Spondylolisthesis at L4-L5 level 01/24/2015  . 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 CONTRACTURE OF SHOULDER JOINT 04/24/2007  .  DIABETES 07/30/2006    Past Medical History:  Diagnosis Date  . ADD (attention deficit disorder)  01/05/2016  . Arthritis   . Asthma   . Benign essential hypertension   . Bipolar disorder [facility](HCC)    hospitalized at [facility] for manic episodes  . Calcaneal spur 01/05/2016  . DDD (degenerative disc disease), lumbar 01/05/2016  . Diabetes mellitus without complication (HCC)   . Diabetic neuropathy (HCC)   . Dysrhythmia    takes diltiazem prescribed by medical doctor; has never seen cardiologist  . Eczema   . Gastroesophageal reflux disease   . Humeral shaft fracture    right  . Hyperlipidemia   . Hypersomnia   . IBS (irritable bowel syndrome)   . Myalgia   . Noncompliance with CPAP treatment   . Osteoarthritis of both feet 01/05/2016  . Osteoarthritis of both hands 01/05/2016  . Osteoarthritis of both knees 01/05/2016  . Pedal edema 01/05/2016  . Peripheral vascular disease (HCC)   . RA (rheumatoid arthritis) (HCC) 01/05/2016   +RF, +CCP, Nodulous,   . Rheumatoid arthritis (HCC)   . Sleep apnea 2015   home sleep study   . TMJ (dislocation of temporomandibular joint) 01/05/2016  . Varicose veins     Family History  Problem Relation Age of Onset  . Hypertension Father   . Alcoholism Mother   . Stroke Mother   . AAA (abdominal aortic aneurysm) Brother   . Gallstones Sister   . Osteoporosis Sister   . Alcoholism Brother   . Diabetes Brother   . Colon cancer Neg Hx   . Colon polyps Neg Hx    Past Surgical History:  Procedure Laterality Date  . BACK SURGERY    . CESAREAN SECTION    . COLONOSCOPY  2011   Dr. Loreta AveMann in [place]  . ELBOW SURGERY    . ENDOVENOUS ABLATION SAPHENOUS VEIN W/ LASER    . ORIF HUMERUS FRACTURE Right 03/29/2017   Procedure: OPEN REDUCTION INTERNAL FIXATION (ORIF) RIGHT HUMERAL SHAFT FRACTURE;  Surgeon: Nadara Mustarduda, Marcus V, MD;  Location: [facility] OR;  Service: Orthopedics;  Laterality: Right;   Social History   Social History Narrative  . Not on file   Immunization History  Administered Date(s) Administered  . Influenza, High Dose Seasonal PF  12/19/2017  . Tdap 12/19/2017  . Zoster Recombinat (Shingrix) 12/19/2017    Objective: Vital Signs: BP 110/71 (BP Location: Left Arm, Patient Position: Sitting, Cuff Size: Normal)   Pulse 92   Resp 15   Ht 5' 2.5" (1.588 m)   Wt 202 lb (91.6 kg)   BMI 36.36 kg/m    Physical Exam Vitals signs and nursing note reviewed.  Constitutional:      Appearance: She is well-developed.  HENT:     Head: Normocephalic and atraumatic.  Eyes:     Conjunctiva/sclera: Conjunctivae normal.  Neck:     Musculoskeletal: Normal range of motion.  Cardiovascular:     Rate and Rhythm: Normal rate and regular rhythm.     Heart sounds: Normal heart sounds.  Pulmonary:     Effort: Pulmonary effort is normal.     Breath sounds: Normal breath sounds.  Abdominal:     General: Bowel sounds are normal.     Palpations: Abdomen is soft.  Lymphadenopathy:     Cervical: No cervical adenopathy.  Skin:    General: Skin is warm and dry.     Capillary Refill: Capillary refill takes less than 2 seconds.  Neurological:  Mental Status: She is alert and oriented to person, place, and time.  Psychiatric:        Behavior: Behavior normal.      Musculoskeletal Exam: Sick spine slightly limited range of motion with lateral rotation to the left.  Good flexion and extension.  She has limited range of motion of her lumbar spine with discomfort.  She has midline spinal tenderness in the lumbar region.  No SI joint tenderness noted.  Shoulder joints, elbow joints, wrist joints, MCPs, PIPs, DIPs good range of motion with no synovitis.  She has complete fist formation bilaterally.  Rheumatoid nodulosis noted on the extensor surfaces of bilateral elbows.  She has complete fist formation bilaterally.  Hip joints, knee joints, ankle joints, MTPs, PIPs and DIPs good range of motion no synovitis.  No warmth or effusion bilateral knee joints.  No tenderness or swelling of ankle joints.  No tenderness of MTPs.  Mild tenderness over  bilateral trochanteric bursa.   CDAI Exam: CDAI Score: 1.2  Patient Global Assessment: 6 (mm); Provider Global Assessment: 6 (mm) Swollen: 0 ; Tender: 0  Joint Exam   Not documented   There is currently no information documented on the homunculus. Go to the Rheumatology activity and complete the homunculus joint exam.  Investigation: No additional findings.  Imaging: No results found.  Recent Labs: Lab Results  Component Value Date   WBC 7.6 01/09/2018   HGB 11.5 (L) 01/09/2018   PLT 336 01/09/2018   NA 140 01/09/2018   K 4.6 01/09/2018   CL 111 (H) 01/09/2018   CO2 20 01/09/2018   GLUCOSE 121 (H) 01/09/2018   BUN 13 01/09/2018   CREATININE 1.01 (H) 01/09/2018   BILITOT 0.3 01/09/2018   ALKPHOS 59 06/07/2016   AST 13 01/09/2018   ALT 12 01/09/2018   PROT 6.5 01/09/2018   ALBUMIN 4.2 06/07/2016   CALCIUM 9.2 01/09/2018   GFRAA 68 01/09/2018    Speciality Comments: No specialty comments available.  Procedures:  No procedures performed Allergies: Fluoxetine hcl (pmdd); Hydroxychloroquine; and Vicodin [hydrocodone-acetaminophen]  Sulfasalazine 1000 mg twice daily.  Most recent CBC/CMP stable on 01/09/2018 and will monitor every 3 months.  Standing orders are in place.  Patient received annual flu and Shingrix vaccine in November.  Recommend Pneumovax 23 and Prevnar 13 as indicated.  Assessment / Plan:     Visit Diagnoses: Rheumatoid arthritis involving multiple sites with positive rheumatoid factor (HCC): She has no synovitis on exam.  She has not had any recent rheumatoid arthritis flares.  She is clinically doing well on Sulfasalazine 500 mg 2 tablets by mouth in the morning and 1 table by mouth in the evening.  Her dose was reduced due to elevated creatinine in the past.  She has not noticed any increased joint pain or joint swelling since reducing the dose of SSZ.  She has chronic bilateral knee joint pain but no warmth or effusion noted.  She has intermittent  discomfort in both hands but no synovitis noted.  She continues to have very small rheumatoid nodules present on the extensor surface of both elbow joints. She will continue on SSZ 500 mg 2 tablets in the morning and 1 tablet in the evening.  She does not need refills today.  She will continue to return for lab work every 3 months to monitor for drug toxicity. She was advised to notify us if she develops increased joint pain or joint swelling. She will follow up in 5 months.  High risk medication use - SSZ 500 mg 2 tablets in the morning and 1 tablet in the evening.  Her dose was reduced due to elevated creatinine.  CBC and CMP are stable on 01/09/2018.  She will return in March and every 3 months for lab work to monitor for drug toxicity.  Standing orders are in place.  She received the annual influenza vaccination and had the first Shingrix vaccine in November.  She has not had any recent infections.  Primary osteoarthritis of both hands: She has PIP and DIP synovial thickening consistent with osteoarthritis of both hands.  She has no synovitis.  She has complete fist formation bilaterally.  Joint protection and muscle strengthening were discussed.   Primary osteoarthritis of both knees: No warmth or effusion.  She has good range of motion on exam.  She has chronic pain in both knee joints.    Primary osteoarthritis of both feet: She has PIP and DIP synovial thickening consistent with osteoarthritis of both feet.  She has no discomfort in her feet at this time.  She wears proper fitting shoes.   DDD (degenerative disc disease), lumbar:  Chronic pain.  She has limited ROM with discomfort.  Her lower back pain is exacerbated by walking for prolonged distances.  She experiences numbness on the lateral aspect of the right thigh.  She has had surgery performed by Dr. Conchita Paris in the past. Which did not improve her symptoms. She is planning to follow up with him.   Spondylolisthesis at L4-L5 level: Chronic  lower back pain.   Neck pain: She has slightly limited ROM with lateral rotation.  She has good flexion and extension.  She has no symptoms of radiculopathy at this time. She was given a handout of neck exercises.  She will notify us if her pain worsens.   Other medical conditions are listed as follows:  Attention deficit disorder, unspecified hyperactivity presence  History of diabetes mellitus  History of gastroesophageal reflux (GERD)  History of asthma  Essential hypertension  History of IBS  History of bipolar disorder  Smoker  History of humerus fracture -  Dr. Lajoyce Corners.   Orders: No orders of the defined types were placed in this encounter.  No orders of the defined types were placed in this encounter.    Follow-Up Instructions: Return in about 5 months (around 07/21/2018) for Rheumatoid arthritis, Osteoarthritis, DDD.   Gearldine Bienenstock, PA-C  Note - This record has been created using Dragon software.  Chart creation errors have been sought, but may not always  have been located. Such creation errors do not reflect on  the standard of medical care.

## 2018-02-19 ENCOUNTER — Ambulatory Visit: Payer: Medicare Other | Admitting: Physician Assistant

## 2018-02-19 ENCOUNTER — Encounter: Payer: Self-pay | Admitting: Physician Assistant

## 2018-02-19 VITALS — BP 110/71 | HR 92 | Resp 15 | Ht 62.5 in | Wt 202.0 lb

## 2018-02-19 DIAGNOSIS — Z8709 Personal history of other diseases of the respiratory system: Secondary | ICD-10-CM

## 2018-02-19 DIAGNOSIS — M0579 Rheumatoid arthritis with rheumatoid factor of multiple sites without organ or systems involvement: Secondary | ICD-10-CM | POA: Diagnosis not present

## 2018-02-19 DIAGNOSIS — M5136 Other intervertebral disc degeneration, lumbar region: Secondary | ICD-10-CM

## 2018-02-19 DIAGNOSIS — I10 Essential (primary) hypertension: Secondary | ICD-10-CM

## 2018-02-19 DIAGNOSIS — M19071 Primary osteoarthritis, right ankle and foot: Secondary | ICD-10-CM

## 2018-02-19 DIAGNOSIS — M4316 Spondylolisthesis, lumbar region: Secondary | ICD-10-CM

## 2018-02-19 DIAGNOSIS — F988 Other specified behavioral and emotional disorders with onset usually occurring in childhood and adolescence: Secondary | ICD-10-CM

## 2018-02-19 DIAGNOSIS — M19041 Primary osteoarthritis, right hand: Secondary | ICD-10-CM

## 2018-02-19 DIAGNOSIS — M19042 Primary osteoarthritis, left hand: Secondary | ICD-10-CM

## 2018-02-19 DIAGNOSIS — Z79899 Other long term (current) drug therapy: Secondary | ICD-10-CM | POA: Diagnosis not present

## 2018-02-19 DIAGNOSIS — M170 Bilateral primary osteoarthritis of knee: Secondary | ICD-10-CM

## 2018-02-19 DIAGNOSIS — M19072 Primary osteoarthritis, left ankle and foot: Secondary | ICD-10-CM

## 2018-02-19 DIAGNOSIS — Z8659 Personal history of other mental and behavioral disorders: Secondary | ICD-10-CM

## 2018-02-19 DIAGNOSIS — M542 Cervicalgia: Secondary | ICD-10-CM

## 2018-02-19 DIAGNOSIS — Z8781 Personal history of (healed) traumatic fracture: Secondary | ICD-10-CM

## 2018-02-19 DIAGNOSIS — M51369 Other intervertebral disc degeneration, lumbar region without mention of lumbar back pain or lower extremity pain: Secondary | ICD-10-CM

## 2018-02-19 DIAGNOSIS — F17200 Nicotine dependence, unspecified, uncomplicated: Secondary | ICD-10-CM

## 2018-02-19 DIAGNOSIS — Z8639 Personal history of other endocrine, nutritional and metabolic disease: Secondary | ICD-10-CM

## 2018-02-19 DIAGNOSIS — Z8719 Personal history of other diseases of the digestive system: Secondary | ICD-10-CM

## 2018-02-19 NOTE — Patient Instructions (Addendum)
Standing Labs We placed an order today for your standing lab work.    Please come back and get your standing labs in March and every 3 months   We have open lab Monday through Friday from 8:30-11:30 AM and 1:30-4:00 PM  at the office of Dr. Pollyann Savoy.   You may experience shorter wait times on Monday and Friday afternoons. The office is located at [address], [address], [place], [place] [address] No appointment is necessary.   Labs are drawn by [facility].  You may receive a bill from [facility] for your lab work.  If you wish to have your labs drawn at another location, please call the office 24 hours in advance to send orders.  If you have any questions regarding directions or hours of operation,  please call [phone].   Just as a reminder please drink plenty of water prior to coming for your lab work. Thanks!    Neck Exercises Neck exercises can be important for many reasons:  They can help you to improve and maintain flexibility in your neck. This can be especially important as you age.  They can help to make your neck stronger. This can make movement easier.  They can reduce or prevent neck pain.  They may help your upper back. Ask your health care provider which neck exercises would be best for you. Exercises to improve neck flexibility Neck stretch Repeat this exercise 3-5 times. 1. Do this exercise while standing or while sitting in a chair. 2. Place your feet flat on the floor, shoulder-width apart. 3. Slowly turn your head to the right. Turn it all the way to the right so you can look over your right shoulder. Do not tilt or tip your head. 4. Hold this position for 10-30 seconds. 5. Slowly turn your head to the left, to look over your left shoulder. 6. Hold this position for 10-30 seconds.  Neck retraction Repeat this exercise 8-10 times. Do this 3-4 times a day or as told by your health care provider. 1. Do this exercise while standing or while sitting in  a sturdy chair. 2. Look straight ahead. Do not bend your neck. 3. Use your fingers to push your chin backward. Do not bend your neck for this movement. Continue to face straight ahead. If you are doing the exercise properly, you will feel a slight sensation in your throat and a stretch at the back of your neck. 4. Hold the stretch for 1-2 seconds. Relax and repeat. Exercises to improve neck strength Neck press Repeat this exercise 10 times. Do it first thing in the morning and right before bed or as told by your health care provider. 1. Lie on your back on a firm bed or on the floor with a pillow under your head. 2. Use your neck muscles to push your head down on the pillow and straighten your spine. 3. Hold the position as well as you can. Keep your head facing up and your chin tucked. 4. Slowly count to 5 while holding this position. 5. Relax for a few seconds. Then repeat. Isometric strengthening Do a full set of these exercises 2 times a day or as told by your health care provider. 1. Sit in a supportive chair and place your hand on your forehead. 2. Push forward with your head and neck while pushing back with your hand. Hold for 10 seconds. 3. Relax. Then repeat the exercise 3 times. 4. Next, do thesequence again, this time putting  your hand against the back of your head. Use your head and neck to push backward against the hand pressure. 5. Finally, do the same exercise on either side of your head, pushing sideways against the pressure of your hand. Prone head lifts Repeat this exercise 5 times. Do this 2 times a day or as told by your health care provider. 1. Lie face-down, resting on your elbows so that your chest and upper back are raised. 2. Start with your head facing downward, near your chest. Position your chin either on or near your chest. 3. Slowly lift your head upward. Lift until you are looking straight ahead. Then continue lifting your head as far back as you can  stretch. 4. Hold your head up for 5 seconds. Then slowly lower it to your starting position. Supine head lifts Repeat this exercise 8-10 times. Do this 2 times a day or as told by your health care provider. 1. Lie on your back, bending your knees to point to the ceiling and keeping your feet flat on the floor. 2. Lift your head slowly off the floor, raising your chin toward your chest. 3. Hold for 5 seconds. 4. Relax and repeat. Scapular retraction Repeat this exercise 5 times. Do this 2 times a day or as told by your health care provider. 1. Stand with your arms at your sides. Look straight ahead. 2. Slowly pull both shoulders backward and downward until you feel a stretch between your shoulder blades in your upper back. 3. Hold for 10-30 seconds. 4. Relax and repeat. Contact a health care provider if:  Your neck pain or discomfort gets much worse when you do an exercise.  Your neck pain or discomfort does not improve within 2 hours after you exercise. If you have any of these problems, stop exercising right away. Do not do the exercises again unless your health care provider says that you can. Get help right away if:  You develop sudden, severe neck pain. If this happens, stop exercising right away. Do not do the exercises again unless your health care provider says that you can. This information is not intended to replace advice given to you by your health care provider. Make sure you discuss any questions you have with your health care provider. Document Released: 01/03/2015 Document Revised: 05/28/2017 Document Reviewed: 08/02/2014 Elsevier Interactive Patient Education  2019 [employer].

## 2018-04-11 ENCOUNTER — Other Ambulatory Visit: Payer: Self-pay | Admitting: Rheumatology

## 2018-04-11 NOTE — Telephone Encounter (Signed)
Last Visit:02/19/18 Next Visit: 07/21/18 Labs: 01/09/18 stable  Patient advised she due for labs. Patient states she will update labs. Patient advised we may refill 30 day supply until labs are updated. Patient states she gets her medication for free if she gets 90 day supply and she can't afford to pay for a 30 day supply of her medication.

## 2018-04-16 ENCOUNTER — Telehealth: Payer: Self-pay | Admitting: Rheumatology

## 2018-04-16 ENCOUNTER — Other Ambulatory Visit: Payer: Self-pay

## 2018-04-16 DIAGNOSIS — Z79899 Other long term (current) drug therapy: Secondary | ICD-10-CM

## 2018-04-16 NOTE — Telephone Encounter (Signed)
Patient request refill on Sulfasalazine 90 day supply sent to [facility] in [place]. Patient had labs drawn today.

## 2018-04-16 NOTE — Telephone Encounter (Signed)
Prescription sent to the pharmacy.

## 2018-04-16 NOTE — Telephone Encounter (Signed)
Patient updated labs 04/16/18. Okay to refill per Dr. Corliss Skains

## 2018-04-17 LAB — COMPLETE METABOLIC PANEL WITH GFR
AG Ratio: 2 (calc) (ref 1.0–2.5)
ALT: 14 U/L (ref 6–29)
AST: 13 U/L (ref 10–35)
Albumin: 4.5 g/dL (ref 3.6–5.1)
Alkaline phosphatase (APISO): 58 U/L (ref 37–153)
BUN/Creatinine Ratio: 19 (calc) (ref 6–22)
BUN: 22 mg/dL (ref 7–25)
CO2: 21 mmol/L (ref 20–32)
Calcium: 9.1 mg/dL (ref 8.6–10.4)
Chloride: 111 mmol/L — ABNORMAL HIGH (ref 98–110)
Creat: 1.15 mg/dL — ABNORMAL HIGH (ref 0.50–0.99)
GFR, Est African American: 58 mL/min/{1.73_m2} — ABNORMAL LOW (ref >=60)
GFR, Est Non African American: 50 mL/min/{1.73_m2} — ABNORMAL LOW (ref >=60)
Globulin: 2.3 g/dL (calc) (ref 1.9–3.7)
Glucose, Bld: 139 mg/dL — ABNORMAL HIGH (ref 65–99)
Potassium: 4.2 mmol/L (ref 3.5–5.3)
Sodium: 141 mmol/L (ref 135–146)
Total Bilirubin: 0.2 mg/dL (ref 0.2–1.2)
Total Protein: 6.8 g/dL (ref 6.1–8.1)

## 2018-04-17 LAB — CBC WITH DIFFERENTIAL/PLATELET
Absolute Monocytes: 547 cells/uL (ref 200–950)
Basophils Absolute: 77 cells/uL (ref 0–200)
Basophils Relative: 1 %
Eosinophils Absolute: 177 cells/uL (ref 15–500)
Eosinophils Relative: 2.3 %
HCT: 35 % (ref 35.0–45.0)
Hemoglobin: 11.2 g/dL — ABNORMAL LOW (ref 11.7–15.5)
Lymphs Abs: 1786 cells/uL (ref 850–3900)
MCH: 27.7 pg (ref 27.0–33.0)
MCHC: 32 g/dL (ref 32.0–36.0)
MCV: 86.6 fL (ref 80.0–100.0)
MPV: 9.7 fL (ref 7.5–12.5)
Monocytes Relative: 7.1 %
Neutro Abs: 5113 cells/uL (ref 1500–7800)
Neutrophils Relative %: 66.4 %
Platelets: 286 10*3/uL (ref 140–400)
RBC: 4.04 10*6/uL (ref 3.80–5.10)
RDW: 16.6 % — ABNORMAL HIGH (ref 11.0–15.0)
Total Lymphocyte: 23.2 %
WBC: 7.7 10*3/uL (ref 3.8–10.8)

## 2018-04-17 NOTE — Progress Notes (Signed)
Decrease in GFR noted.  Please notify patient and fax results to her PCP.

## 2018-07-07 NOTE — Progress Notes (Addendum)
Office Visit Note  Patient: Margaret [name]             Date of Birth: [birth year]           MRN: [id number]             PCP: Kirstie Peri, MD Referring: Kirstie Peri, MD Visit Date: 07/21/2018 Occupation: @GUAROCC @  Subjective:  Medication monitoring    History of Present Illness: [name] is a 68 y.o. female with history of rheumatoid arthritis.  She states she continues to do well on sulfasalazine.  She denies any joint swelling.  She states she continues to have some discomfort in her left lower back which radiates into her left lower extremity.  She has had lumbar spine surgery in the past.  She states the symptoms have been since since then.  She denies any discomfort in her knees or feet.  She denies any joint swelling.  Activities of Daily Living:  Patient reports morning stiffness for 0 minute.   Patient Denies nocturnal pain.  Difficulty dressing/grooming: Denies Difficulty climbing stairs: Denies Difficulty getting out of chair: Denies Difficulty using hands for taps, buttons, cutlery, and/or writing: Denies  Review of Systems  Constitutional: Negative for fatigue, night sweats, weight gain and weight loss.  HENT: Positive for mouth dryness. Negative for mouth sores, trouble swallowing, trouble swallowing and nose dryness.   Eyes: Positive for dryness. Negative for pain, redness and visual disturbance.  Respiratory: Negative for cough, shortness of breath, wheezing and difficulty breathing.   Cardiovascular: Negative for chest pain, palpitations, hypertension, irregular heartbeat and swelling in legs/feet.  Gastrointestinal: Negative for blood in stool, constipation and diarrhea.  Endocrine: Negative for excessive thirst and increased urination.  Genitourinary: Negative for difficulty urinating and vaginal dryness.  Musculoskeletal: Positive for arthralgias and joint pain. Negative for joint swelling, myalgias, muscle weakness, morning stiffness, muscle tenderness  and myalgias.  Skin: Negative for color change, rash, hair loss, redness, skin tightness, ulcers and sensitivity to sunlight.  Allergic/Immunologic: Negative for susceptible to infections.  Neurological: Negative for dizziness, headaches, memory loss, night sweats and weakness.  Hematological: Negative for bruising/bleeding tendency and swollen glands.  Psychiatric/Behavioral: Positive for sleep disturbance. Negative for depressed mood. The patient is not nervous/anxious.     PMFS History:  Patient Active Problem List   Diagnosis Date Noted  . Idiopathic chronic venous hypertension of both lower extremities with inflammation 06/18/2017  . Closed displaced oblique fracture of shaft of right humerus 03/27/2017  . Smoker 01/09/2016  . High risk medication use 01/06/2016  . RA (rheumatoid arthritis) (HCC) 01/05/2016  . DDD (degenerative disc disease), lumbar 01/05/2016  . Calcaneal spur 01/05/2016  . Bipolar disorder (HCC) 01/05/2016  . ADD (attention deficit disorder) 01/05/2016  . Sleep apnea 01/05/2016  . TMJ (dislocation of temporomandibular joint) 01/05/2016  . Pedal edema 01/05/2016  . Osteoarthritis of both hands 01/05/2016  . Osteoarthritis of both feet 01/05/2016  . Osteoarthritis of both knees 01/05/2016  . GERD (gastroesophageal reflux disease) 06/24/2015  . Constipation 06/24/2015  . Spondylolisthesis at L4-L5 level 01/24/2015  . 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 CONTRACTURE OF SHOULDER JOINT 04/24/2007  . DIABETES 07/30/2006    Past Medical History:  Diagnosis Date  . ADD (attention deficit disorder) 01/05/2016  . Arthritis   . Asthma   . Benign essential hypertension   . Bipolar disorder [facility])    hospitalized at [facility] for manic episodes  . Calcaneal spur 01/05/2016  . DDD (degenerative disc disease), lumbar  01/05/2016  . Diabetes mellitus without complication (HCC)   . Diabetic neuropathy (HCC)   . Dysrhythmia    takes diltiazem prescribed by medical doctor;  has never seen cardiologist  . Eczema   . Gastroesophageal reflux disease   . Humeral shaft fracture    right  . Hyperlipidemia   . Hypersomnia   . IBS (irritable bowel syndrome)   . Myalgia   . Noncompliance with CPAP treatment   . Osteoarthritis of both feet 01/05/2016  . Osteoarthritis of both hands 01/05/2016  . Osteoarthritis of both knees 01/05/2016  . Pedal edema 01/05/2016  . Peripheral vascular disease (HCC)   . RA (rheumatoid arthritis) (HCC) 01/05/2016   +RF, +CCP, Nodulous,   . Rheumatoid arthritis (HCC)   . Sleep apnea 2015   home sleep study   . TMJ (dislocation of temporomandibular joint) 01/05/2016  . Varicose veins     Family History  Problem Relation Age of Onset  . Hypertension Father   . Alcoholism Mother   . Stroke Mother   . AAA (abdominal aortic aneurysm) Brother   . Gallstones Sister   . Osteoporosis Sister   . Alcoholism Brother   . Diabetes Brother   . Colon cancer Neg Hx   . Colon polyps Neg Hx    Past Surgical History:  Procedure Laterality Date  . BACK SURGERY    . CESAREAN SECTION    . COLONOSCOPY  2011   Dr. Loreta AveMann in [place]  . ELBOW SURGERY    . ENDOVENOUS ABLATION SAPHENOUS VEIN W/ LASER    . ORIF HUMERUS FRACTURE Right 03/29/2017   Procedure: OPEN REDUCTION INTERNAL FIXATION (ORIF) RIGHT HUMERAL SHAFT FRACTURE;  Surgeon: Nadara Mustarduda, Marcus V, MD;  Location: [facility] OR;  Service: Orthopedics;  Laterality: Right;   Social History   Social History Narrative  . Not on file   Immunization History  Administered Date(s) Administered  . Influenza, High Dose Seasonal PF 12/19/2017  . Tdap 12/19/2017  . Zoster Recombinat (Shingrix) 12/19/2017, 03/03/2018     Objective: Vital Signs: BP 111/71 (BP Location: Left Arm, Patient Position: Sitting, Cuff Size: Normal)   Pulse 97   Resp 12   Ht 5' 2.5" (1.588 m)   Wt 200 lb (90.7 kg)   BMI 36.00 kg/m    Physical Exam Vitals signs and nursing note reviewed.  Constitutional:      Appearance:  She is well-developed.  HENT:     Head: Normocephalic and atraumatic.  Eyes:     Conjunctiva/sclera: Conjunctivae normal.  Neck:     Musculoskeletal: Normal range of motion.  Cardiovascular:     Rate and Rhythm: Normal rate and regular rhythm.     Heart sounds: Normal heart sounds.  Pulmonary:     Effort: Pulmonary effort is normal.     Breath sounds: Normal breath sounds.  Abdominal:     General: Bowel sounds are normal.     Palpations: Abdomen is soft.  Lymphadenopathy:     Cervical: No cervical adenopathy.  Skin:    General: Skin is warm and dry.     Capillary Refill: Capillary refill takes less than 2 seconds.  Neurological:     Mental Status: She is alert and oriented to person, place, and time.  Psychiatric:        Behavior: Behavior normal.      Musculoskeletal Exam: C-spine good range of motion.  She has limited range of motion of her lumbar spine with discomfort.  Shoulder joints elbow joints  wrist joints with good range of motion.  She has thickening of PIP and DIP joints.  No MCP swelling or wrist joint swelling was noted.  Hip joints and knee joints with good range of motion.  No swelling over MTP joints was noted.  CDAI Exam: CDAI Score: 0.2  Patient Global: 1 mm; Provider Global: 1 mm Swollen: 0 ; Tender: 0  Joint Exam   No joint exam has been documented for this visit   There is currently no information documented on the homunculus. Go to the Rheumatology activity and complete the homunculus joint exam.  Investigation: No additional findings.  Imaging: No results found.  Recent Labs: Lab Results  Component Value Date   WBC 7.7 04/16/2018   HGB 11.2 (L) 04/16/2018   PLT 286 04/16/2018   NA 141 04/16/2018   K 4.2 04/16/2018   CL 111 (H) 04/16/2018   CO2 21 04/16/2018   GLUCOSE 139 (H) 04/16/2018   BUN 22 04/16/2018   CREATININE 1.15 (H) 04/16/2018   BILITOT 0.2 04/16/2018   ALKPHOS 59 06/07/2016   AST 13 04/16/2018   ALT 14 04/16/2018   PROT  6.8 04/16/2018   ALBUMIN 4.2 06/07/2016   CALCIUM 9.1 04/16/2018   GFRAA 58 (L) 04/16/2018    Speciality Comments: No specialty comments available.  Procedures:  No procedures performed Allergies: Fluoxetine hcl (pmdd), Hydroxychloroquine, and Vicodin [hydrocodone-acetaminophen]   Assessment / Plan:     Visit Diagnoses: Rheumatoid arthritis involving multiple sites with positive rheumatoid factor (HCC) -patient had no synovitis on examination today.  She has been tolerating sulfasalazine well.  High risk medication use - Sulfasalazine 500 mg 2 tablets in the morning and 1 tablet in the evening.  Most recent CBC/CMP stable except for decrease in GFR on 04/16/2018.  Due for CBC/CMP today and will monitor every 3 months.  Standing orders are in place.  She received high-dose flu vaccine in November and previously Shingrix.  Recommend Prevnar 13 and Pneumovax 23 as indicated. - Plan: CBC with Differential/Platelet, COMPLETE METABOLIC PANEL WITH GFR, Dr. Sherryll Burger reviewed her labs and her GFR has been variable over the years per patient.  Primary osteoarthritis of both hands -joint protection muscle strengthening was discussed.  Primary osteoarthritis of both knees -she is currently not having much discomfort.  Weight loss diet and exercise was discussed.  Primary osteoarthritis of both feet -currently asymptomatic.  DDD (degenerative disc disease), lumbar -patient had surgery in the past.  She has chronic discomfort and left-sided radiculopathy.  Spondylolisthesis at L4-L5 level   Osteopenia of multiple sites - Plan: Parathyroid hormone, intact (no Ca), Phosphorus, patient reports having bone density by Dr. Sherryll Burger.  She is on calcium and vitamin D. Other medical problems are listed as follows:  Attention deficit disorder, unspecified hyperactivity presence   History of bipolar disorder   Essential hypertension   History of asthma   History of diabetes mellitus   History of humerus  fracture - Dr. Lajoyce Corners.   History of IBS  History of gastroesophageal reflux (GERD)   Smoker -smoking cessation discussed.  Orders: Orders Placed This Encounter  Procedures  . CBC with Differential/Platelet  . COMPLETE METABOLIC PANEL WITH GFR  . Parathyroid hormone, intact (no Ca)  . Phosphorus   No orders of the defined types were placed in this encounter.    Follow-Up Instructions: Return in about 5 months (around 12/21/2018) for Rheumatoid arthritis, Osteoarthritis.   Pollyann Savoy, MD  Note - This record has been created using  Dragon software.  Chart creation errors have been sought, but may not always  have been located. Such creation errors do not reflect on  the standard of medical care. 

## 2018-07-09 ENCOUNTER — Other Ambulatory Visit: Payer: Self-pay | Admitting: *Deleted

## 2018-07-09 MED ORDER — SULFASALAZINE 500 MG PO TBEC: 1000.0000 mg | DELAYED_RELEASE_TABLET | Freq: Every day | ORAL | 0 refills | Status: DC

## 2018-07-09 NOTE — Telephone Encounter (Signed)
Patient contacted the office for a prescription refill of SSZ   Last Visit: 02/19/18  Next Visit: 07/21/18 Labs: 04/16/18 Decrease in GFR noted.  Patient will update labs at her follow up appointment.   Okay to refill per Dr. Corliss Skains

## 2018-07-10 MED ORDER — SULFASALAZINE 500 MG PO TBEC: DELAYED_RELEASE_TABLET | ORAL | 0 refills | Status: DC

## 2018-07-10 NOTE — Addendum Note (Signed)
Addended by: Henriette Combs on: 07/10/2018 04:24 PM   Modules accepted: Orders

## 2018-07-21 ENCOUNTER — Encounter: Payer: Self-pay | Admitting: Physician Assistant

## 2018-07-21 ENCOUNTER — Ambulatory Visit: Payer: Medicare Other | Admitting: Rheumatology

## 2018-07-21 ENCOUNTER — Other Ambulatory Visit: Payer: Self-pay

## 2018-07-21 VITALS — BP 111/71 | HR 97 | Resp 12 | Ht 62.5 in | Wt 200.0 lb

## 2018-07-21 DIAGNOSIS — Z8659 Personal history of other mental and behavioral disorders: Secondary | ICD-10-CM

## 2018-07-21 DIAGNOSIS — Z79899 Other long term (current) drug therapy: Secondary | ICD-10-CM | POA: Diagnosis not present

## 2018-07-21 DIAGNOSIS — M170 Bilateral primary osteoarthritis of knee: Secondary | ICD-10-CM

## 2018-07-21 DIAGNOSIS — M4316 Spondylolisthesis, lumbar region: Secondary | ICD-10-CM

## 2018-07-21 DIAGNOSIS — M0579 Rheumatoid arthritis with rheumatoid factor of multiple sites without organ or systems involvement: Secondary | ICD-10-CM | POA: Diagnosis not present

## 2018-07-21 DIAGNOSIS — M19071 Primary osteoarthritis, right ankle and foot: Secondary | ICD-10-CM

## 2018-07-21 DIAGNOSIS — M19072 Primary osteoarthritis, left ankle and foot: Secondary | ICD-10-CM

## 2018-07-21 DIAGNOSIS — M5136 Other intervertebral disc degeneration, lumbar region: Secondary | ICD-10-CM

## 2018-07-21 DIAGNOSIS — Z8719 Personal history of other diseases of the digestive system: Secondary | ICD-10-CM

## 2018-07-21 DIAGNOSIS — M8589 Other specified disorders of bone density and structure, multiple sites: Secondary | ICD-10-CM

## 2018-07-21 DIAGNOSIS — Z8639 Personal history of other endocrine, nutritional and metabolic disease: Secondary | ICD-10-CM

## 2018-07-21 DIAGNOSIS — M19041 Primary osteoarthritis, right hand: Secondary | ICD-10-CM | POA: Diagnosis not present

## 2018-07-21 DIAGNOSIS — Z8709 Personal history of other diseases of the respiratory system: Secondary | ICD-10-CM

## 2018-07-21 DIAGNOSIS — Z8781 Personal history of (healed) traumatic fracture: Secondary | ICD-10-CM

## 2018-07-21 DIAGNOSIS — F17200 Nicotine dependence, unspecified, uncomplicated: Secondary | ICD-10-CM

## 2018-07-21 DIAGNOSIS — M19042 Primary osteoarthritis, left hand: Secondary | ICD-10-CM

## 2018-07-21 DIAGNOSIS — F988 Other specified behavioral and emotional disorders with onset usually occurring in childhood and adolescence: Secondary | ICD-10-CM

## 2018-07-21 DIAGNOSIS — I10 Essential (primary) hypertension: Secondary | ICD-10-CM

## 2018-07-21 NOTE — Patient Instructions (Signed)
Standing Labs We placed an order today for your standing lab work.    Please come back and get your standing labs in September and every 3 months  We have open lab daily Monday through Thursday from 8:30-12:30 PM and 1:30-4:30 PM and Friday from 8:30-12:30 PM and 1:30 -4:00 PM at the office of Dr. Latoshia Monrroy.   You may experience shorter wait times on Monday and Friday afternoons. The office is located at [address], [address], [place], [place] [address] No appointment is necessary.   Labs are drawn by [facility].  You may receive a bill from [facility] for your lab work.  If you wish to have your labs drawn at another location, please call the office 24 hours in advance to send orders.  If you have any questions regarding directions or hours of operation,  please call [phone].   Just as a reminder please drink plenty of water prior to coming for your lab work. Thanks!  

## 2018-07-22 LAB — COMPLETE METABOLIC PANEL WITH GFR
AG Ratio: 2.1 (calc) (ref 1.0–2.5)
ALT: 13 U/L (ref 6–29)
AST: 12 U/L (ref 10–35)
Albumin: 4.6 g/dL (ref 3.6–5.1)
Alkaline phosphatase (APISO): 55 U/L (ref 37–153)
BUN/Creatinine Ratio: 17 (calc) (ref 6–22)
BUN: 20 mg/dL (ref 7–25)
CO2: 20 mmol/L (ref 20–32)
Calcium: 9.6 mg/dL (ref 8.6–10.4)
Chloride: 109 mmol/L (ref 98–110)
Creat: 1.2 mg/dL — ABNORMAL HIGH (ref 0.50–0.99)
GFR, Est African American: 55 mL/min/{1.73_m2} — ABNORMAL LOW (ref >=60)
GFR, Est Non African American: 47 mL/min/{1.73_m2} — ABNORMAL LOW (ref >=60)
Globulin: 2.2 g/dL (calc) (ref 1.9–3.7)
Glucose, Bld: 75 mg/dL (ref 65–99)
Potassium: 4.3 mmol/L (ref 3.5–5.3)
Sodium: 141 mmol/L (ref 135–146)
Total Bilirubin: 0.3 mg/dL (ref 0.2–1.2)
Total Protein: 6.8 g/dL (ref 6.1–8.1)

## 2018-07-22 LAB — CBC WITH DIFFERENTIAL/PLATELET
Absolute Monocytes: 608 cells/uL (ref 200–950)
Basophils Absolute: 56 cells/uL (ref 0–200)
Basophils Relative: 0.7 %
Eosinophils Absolute: 200 cells/uL (ref 15–500)
Eosinophils Relative: 2.5 %
HCT: 34.8 % — ABNORMAL LOW (ref 35.0–45.0)
Hemoglobin: 11.1 g/dL — ABNORMAL LOW (ref 11.7–15.5)
Lymphs Abs: 1752 cells/uL (ref 850–3900)
MCH: 27 pg (ref 27.0–33.0)
MCHC: 31.9 g/dL — ABNORMAL LOW (ref 32.0–36.0)
MCV: 84.7 fL (ref 80.0–100.0)
MPV: 9.6 fL (ref 7.5–12.5)
Monocytes Relative: 7.6 %
Neutro Abs: 5384 cells/uL (ref 1500–7800)
Neutrophils Relative %: 67.3 %
Platelets: 277 10*3/uL (ref 140–400)
RBC: 4.11 10*6/uL (ref 3.80–5.10)
RDW: 16.1 % — ABNORMAL HIGH (ref 11.0–15.0)
Total Lymphocyte: 21.9 %
WBC: 8 10*3/uL (ref 3.8–10.8)

## 2018-07-22 LAB — PHOSPHORUS: Phosphorus: 4.4 mg/dL — ABNORMAL HIGH (ref 2.1–4.3)

## 2018-07-22 LAB — PARATHYROID HORMONE, INTACT (NO CA): PTH: 18 pg/mL (ref 14–64)

## 2018-07-22 NOTE — Progress Notes (Signed)
Please notify patient and forward a copy to her PCP.

## 2018-07-24 ENCOUNTER — Other Ambulatory Visit: Payer: Self-pay | Admitting: Rheumatology

## 2018-10-07 ENCOUNTER — Other Ambulatory Visit: Payer: Self-pay

## 2018-10-07 ENCOUNTER — Other Ambulatory Visit: Payer: Self-pay | Admitting: Rheumatology

## 2018-10-07 DIAGNOSIS — Z79899 Other long term (current) drug therapy: Secondary | ICD-10-CM

## 2018-10-07 NOTE — Telephone Encounter (Signed)
Patient's GFR is lower.  Please have her reduce sulfasalazine to 1 tablet p.o. twice daily.

## 2018-10-07 NOTE — Telephone Encounter (Signed)
Last Visit: 07/21/18 Next Visit: 12/22/18 Labs: 07/21/18 Creat. 1.20 GFR 47 Hgb 11.1 Hct. 34.8 MCHC 31.9 RDW 16.1   Okay to refill SSZ?

## 2018-10-07 NOTE — Telephone Encounter (Signed)
Patient needs refill on Sulfasalazine  90 day sent to [facility].

## 2018-10-08 LAB — CBC WITH DIFFERENTIAL/PLATELET
Absolute Monocytes: 585 cells/uL (ref 200–950)
Basophils Absolute: 68 cells/uL (ref 0–200)
Basophils Relative: 0.9 %
Eosinophils Absolute: 243 cells/uL (ref 15–500)
Eosinophils Relative: 3.2 %
HCT: 34.2 % — ABNORMAL LOW (ref 35.0–45.0)
Hemoglobin: 10.6 g/dL — ABNORMAL LOW (ref 11.7–15.5)
Lymphs Abs: 1885 cells/uL (ref 850–3900)
MCH: 26.2 pg — ABNORMAL LOW (ref 27.0–33.0)
MCHC: 31 g/dL — ABNORMAL LOW (ref 32.0–36.0)
MCV: 84.7 fL (ref 80.0–100.0)
MPV: 9.1 fL (ref 7.5–12.5)
Monocytes Relative: 7.7 %
Neutro Abs: 4818 cells/uL (ref 1500–7800)
Neutrophils Relative %: 63.4 %
Platelets: 288 10*3/uL (ref 140–400)
RBC: 4.04 10*6/uL (ref 3.80–5.10)
RDW: 16.5 % — ABNORMAL HIGH (ref 11.0–15.0)
Total Lymphocyte: 24.8 %
WBC: 7.6 10*3/uL (ref 3.8–10.8)

## 2018-10-08 LAB — COMPLETE METABOLIC PANEL WITH GFR
AG Ratio: 2.4 (calc) (ref 1.0–2.5)
ALT: 8 U/L (ref 6–29)
AST: 9 U/L — ABNORMAL LOW (ref 10–35)
Albumin: 4.3 g/dL (ref 3.6–5.1)
Alkaline phosphatase (APISO): 50 U/L (ref 37–153)
BUN/Creatinine Ratio: 15 (calc) (ref 6–22)
BUN: 17 mg/dL (ref 7–25)
CO2: 20 mmol/L (ref 20–32)
Calcium: 8.9 mg/dL (ref 8.6–10.4)
Chloride: 109 mmol/L (ref 98–110)
Creat: 1.12 mg/dL — ABNORMAL HIGH (ref 0.50–0.99)
GFR, Est African American: 59 mL/min/{1.73_m2} — ABNORMAL LOW (ref >=60)
GFR, Est Non African American: 51 mL/min/{1.73_m2} — ABNORMAL LOW (ref >=60)
Globulin: 1.8 g/dL (calc) — ABNORMAL LOW (ref 1.9–3.7)
Glucose, Bld: 78 mg/dL (ref 65–99)
Potassium: 4.4 mmol/L (ref 3.5–5.3)
Sodium: 139 mmol/L (ref 135–146)
Total Bilirubin: 0.3 mg/dL (ref 0.2–1.2)
Total Protein: 6.1 g/dL (ref 6.1–8.1)

## 2018-10-08 MED ORDER — SULFASALAZINE 500 MG PO TBEC: 500.0000 mg | DELAYED_RELEASE_TABLET | Freq: Two times a day (BID) | ORAL | 0 refills | Status: DC

## 2018-10-08 NOTE — Progress Notes (Signed)
Labs are stable.  Mild anemia noted.  Please advise patient to discuss this further with her PCP.

## 2018-10-08 NOTE — Telephone Encounter (Signed)
Patient advised reduce sulfasalazine to 1 tablet p.o. twice daily. Patient verbalized understanding.

## 2018-11-13 ENCOUNTER — Telehealth: Payer: Self-pay | Admitting: Rheumatology

## 2018-11-13 MED ORDER — SULFASALAZINE 500 MG PO TABS: ORAL_TABLET | ORAL | 0 refills | Status: DC

## 2018-11-13 NOTE — Telephone Encounter (Signed)
Returned call to the patient. Patient her SSZ was decreased from 3 tablets per day to 2 tablets per day when it was refilled in September. We called patient and advised her to decrease medication due to decreased GFR. Patient does not recall being told this. Patient has been offered an appointment to ome in and discuss the pain she is having and to discuss medication. Patient raised her tone and said she doesn't now why she need san appointment. She already has one scheduled. [place] for the beach. Patient yelled it is my diabetes medication that is causing my kidney issues. Her follow up visit is scheduled for 12/22/18. Patient asked to speak with Dr. Estanislado Pandy or Hazel Sams, PA-C. I advised her that they are in clinic seeing patient's and I would send the message.

## 2018-11-13 NOTE — Telephone Encounter (Signed)
I spoke with patient.  Her GFR is low but is stable.  I have advised her to increase sulfasalazine to 3 tablets a day.  She will follow-up in November as a schedule.

## 2018-11-13 NOTE — Telephone Encounter (Signed)
Patient called stating she has been experiencing "pain all over her body" since Dr. Estanislado Pandy decreased her Sulfasalazine to 2 tablets per day.  Patient is requesting a return call.

## 2018-12-08 NOTE — Progress Notes (Signed)
Office Visit Note  Patient: Margaret [name]             Date of Birth: Feb 17, 1950           MRN: [id number]             PCP: Monico Blitz, MD Referring: Monico Blitz, MD Visit Date: 12/22/2018 Occupation: @GUAROCC @  Subjective:  Lower back pain   History of Present Illness: Margaret [name] is a 68 y.o. female with history of seropositive rheumatoid arthritis, osteoarthritis, and DDD.  She is taking Sulfasalazine 500 mg 2 tablets in the morning and 1 tablet in the evening.  She states after her last visit she reduced SSZ to 2 tablets daily and she started having increased pain in multiple joints including lower back pain, bilateral CMC joint pain, and both knee joints.  She denies any joint swelling currently.  She states she is scheduled for gelsyn injections today with another provider.     Activities of Daily Living:  Patient reports morning stiffness for 0   minutes.   Patient Denies nocturnal pain.  Difficulty dressing/grooming: Denies Difficulty climbing stairs: Denies Difficulty getting out of chair: Denies Difficulty using hands for taps, buttons, cutlery, and/or writing: Denies  Review of Systems  Constitutional: Positive for fatigue.  HENT: Positive for mouth dryness. Negative for mouth sores and nose dryness.   Eyes: Positive for dryness. Negative for pain and visual disturbance.  Respiratory: Negative for cough, hemoptysis, shortness of breath and difficulty breathing.   Cardiovascular: Negative for chest pain, palpitations, hypertension and swelling in legs/feet.  Gastrointestinal: Positive for constipation (Hx of IBS) and diarrhea. Negative for blood in stool.  Endocrine: Negative for increased urination.  Genitourinary: Negative for painful urination.  Musculoskeletal: Positive for arthralgias, joint pain and muscle tenderness. Negative for joint swelling, myalgias, muscle weakness, morning stiffness and myalgias.  Skin: Negative for color change, pallor, rash, hair  loss, nodules/bumps, skin tightness, ulcers and sensitivity to sunlight.  Allergic/Immunologic: Negative for susceptible to infections.  Neurological: Negative for dizziness, numbness, headaches and weakness.  Hematological: Negative for swollen glands.  Psychiatric/Behavioral: Positive for sleep disturbance. Negative for depressed mood. The patient is not nervous/anxious.     PMFS History:  Patient Active Problem List   Diagnosis Date Noted  . Idiopathic chronic venous hypertension of both lower extremities with inflammation 06/18/2017  . Closed displaced oblique fracture of shaft of right humerus 03/27/2017  . Smoker 01/09/2016  . High risk medication use 01/06/2016  . RA (rheumatoid arthritis) ([place]) 01/05/2016  . DDD (degenerative disc disease), lumbar 01/05/2016  . Calcaneal spur 01/05/2016  . Bipolar disorder ([place]) 01/05/2016  . ADD (attention deficit disorder) 01/05/2016  . Sleep apnea 01/05/2016  . TMJ (dislocation of temporomandibular joint) 01/05/2016  . Pedal edema 01/05/2016  . Osteoarthritis of both hands 01/05/2016  . Osteoarthritis of both feet 01/05/2016  . Osteoarthritis of both knees 01/05/2016  . GERD (gastroesophageal reflux disease) 06/24/2015  . Constipation 06/24/2015  . Spondylolisthesis at L4-L5 level 01/24/2015  . 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 CONTRACTURE OF SHOULDER JOINT 04/24/2007  . DIABETES 07/30/2006    Past Medical History:  Diagnosis Date  . ADD (attention deficit disorder) 01/05/2016  . Arthritis   . Asthma   . Benign essential hypertension   . Bipolar disorder [facility])    hospitalized at [facility] for manic episodes  . Calcaneal spur 01/05/2016  . DDD (degenerative disc disease), lumbar 01/05/2016  . Diabetes mellitus without complication ([place])   .  Diabetic neuropathy (HCC)   . Dysrhythmia    takes diltiazem prescribed by medical doctor; has never seen cardiologist  . Eczema   . Gastroesophageal reflux disease   . Humeral shaft fracture     right  . Hyperlipidemia   . Hypersomnia   . IBS (irritable bowel syndrome)   . Myalgia   . Noncompliance with CPAP treatment   . Osteoarthritis of both feet 01/05/2016  . Osteoarthritis of both hands 01/05/2016  . Osteoarthritis of both knees 01/05/2016  . Pedal edema 01/05/2016  . Peripheral vascular disease (HCC)   . RA (rheumatoid arthritis) (HCC) 01/05/2016   +RF, +CCP, Nodulous,   . Rheumatoid arthritis (HCC)   . Sleep apnea 2015   home sleep study   . TMJ (dislocation of temporomandibular joint) 01/05/2016  . Varicose veins     Family History  Problem Relation Age of Onset  . Hypertension Father   . Alcoholism Mother   . Stroke Mother   . AAA (abdominal aortic aneurysm) Brother   . Gallstones Sister   . Osteoporosis Sister   . Alcoholism Brother   . Diabetes Brother   . Colon cancer Neg Hx   . Colon polyps Neg Hx    Past Surgical History:  Procedure Laterality Date  . BACK SURGERY    . CESAREAN SECTION    . COLONOSCOPY  2011   Dr. Loreta AveMann in [place]  . ELBOW SURGERY    . ENDOVENOUS ABLATION SAPHENOUS VEIN W/ LASER    . ORIF HUMERUS FRACTURE Right 03/29/2017   Procedure: OPEN REDUCTION INTERNAL FIXATION (ORIF) RIGHT HUMERAL SHAFT FRACTURE;  Surgeon: Nadara Mustarduda, Marcus V, MD;  Location: [facility] OR;  Service: Orthopedics;  Laterality: Right;   Social History   Social History Narrative  . Not on file   Immunization History  Administered Date(s) Administered  . Influenza, High Dose Seasonal PF 12/19/2017  . Tdap 12/19/2017  . Zoster Recombinat (Shingrix) 12/19/2017, 03/03/2018     Objective: Vital Signs: There were no vitals taken for this visit.   Physical Exam Vitals signs and nursing note reviewed.  Constitutional:      Appearance: She is well-developed.  HENT:     Head: Normocephalic and atraumatic.  Eyes:     Conjunctiva/sclera: Conjunctivae normal.  Neck:     Musculoskeletal: Normal range of motion.  Cardiovascular:     Rate and Rhythm: Normal rate  and regular rhythm.     Heart sounds: Normal heart sounds.  Pulmonary:     Effort: Pulmonary effort is normal.     Breath sounds: Normal breath sounds.  Abdominal:     General: Bowel sounds are normal.     Palpations: Abdomen is soft.  Lymphadenopathy:     Cervical: No cervical adenopathy.  Skin:    General: Skin is warm and dry.     Capillary Refill: Capillary refill takes less than 2 seconds.  Neurological:     Mental Status: She is alert and oriented to person, place, and time.  Psychiatric:        Behavior: Behavior normal.      Musculoskeletal Exam: C-spine good ROM. Limited ROM of lumbar spine with discomfort.  Left paravertebral muscle tenderness.  Shoulder joints, elbow joints, wrist joints, MCPs, PIPs, and DIPs good ROM with on synovitis.  Bilateral CMC joint tenderness.  Hip joints, knee joints, ankle joints, MTPs, PIPs, and DIPs good ROM with no synovitis.  No warmth or effusion of knee joints.  No tenderness or swelling of  ankle joints.    CDAI Exam: CDAI Score: - Patient Global: -; Provider Global: - Swollen: -; Tender: - Joint Exam   No joint exam has been documented for this visit   There is currently no information documented on the homunculus. Go to the Rheumatology activity and complete the homunculus joint exam.  Investigation: No additional findings.  Imaging: No results found.  Recent Labs: Lab Results  Component Value Date   WBC 7.6 10/07/2018   HGB 10.6 (L) 10/07/2018   PLT 288 10/07/2018   NA 139 10/07/2018   K 4.4 10/07/2018   CL 109 10/07/2018   CO2 20 10/07/2018   GLUCOSE 78 10/07/2018   BUN 17 10/07/2018   CREATININE 1.12 (H) 10/07/2018   BILITOT 0.3 10/07/2018   ALKPHOS 59 06/07/2016   AST 9 (L) 10/07/2018   ALT 8 10/07/2018   PROT 6.1 10/07/2018   ALBUMIN 4.2 06/07/2016   CALCIUM 8.9 10/07/2018   GFRAA 59 (L) 10/07/2018    Speciality Comments: No specialty comments available.  Procedures:  No procedures performed  Allergies: Fluoxetine hcl (pmdd), Hydroxychloroquine, and Vicodin [hydrocodone-acetaminophen]   Assessment / Plan:     Visit Diagnoses: Rheumatoid arthritis involving multiple sites with positive rheumatoid factor (HCC): She has no synovitis on exam.  She has not had recent rheumatoid arthritis flares.  She is clinically doing well on Sulfasalazine 500 mg 2 tablets in the morning and 1 tablet in the evening.  She will continue on this current treatment regimen.  She has tenderness and synovial thickening of bilateral CMC joints due to osteoarthritis.  She also presents today with increased lower back pain-tenderness in the left paravertebral region.   No midline spinal tenderness or symptoms of radiculopathy.  X-rays of the lumbar spine were obtained today.  She was advised to notify us if she develops increased joint pain or joint swelling.  She will follow up in 5 months.   High risk medication use - Sulfasalazine 500 mg 2 tablets in the morning and 1 tablet in the evening.  CBC and CMP were drawn on 10/07/18.  She will return for lab work in December and every 3 months.   Primary osteoarthritis of both hands: She has PIP and DIP synovial thickening consistent with osteoarthritis of both hands.  She has tenderness and synovial thickening both CMC joints.  We discussed using CMC joint braces as needed.  She was given a handout of hand exercises to perform.  Joint protection and muscle strengthening were discussed.   Primary osteoarthritis of both knees: She has good ROM with no discomfort.  No warmth or effusion noted.  She is scheduled for Gelsyn injections in both knee joints today at another provider's office.   Primary osteoarthritis of both feet: She has no discomfort in her feet at this time.   DDD (degenerative disc disease), lumbar: Chronic pain. She has been experiencing increased lower back pain.  Tenderness in the left paravertebral region.  No midline spinal tenderness or symptoms of  radiculopathy.   Chronic left-sided low back pain without sciatica - She presents today with increased lower back pain.  X-rays of the lumbar spine were obtained today. She has tenderness in the left paravertebral region.  No symptoms of radiculopathy. Plan: XR Lumbar Spine 2-3 Views  Spondylolisthesis at L4-L5 level  Osteopenia of multiple sites  Other medical conditions are listed as follows:   Attention deficit disorder, unspecified hyperactivity presence  History of bipolar disorder  Essential hypertension  History of  asthma  History of diabetes mellitus  History of humerus fracture  History of IBS  History of gastroesophageal reflux (GERD)  Smoker    Orders: Orders Placed This Encounter  Procedures  . XR Lumbar Spine 2-3 Views   No orders of the defined types were placed in this encounter.   Face-to-face time spent with patient was 30 minutes. Greater than 50% of time was spent in counseling and coordination of care.  Follow-Up Instructions: Return in about 5 months (around 05/22/2019) for Rheumatoid arthritis, Osteoarthritis, DDD.   Gearldine Bienenstock, PA-C   I examined and evaluated the patient with Sherron Ales PA.  I reviewed x-rays of the lumbar spine with the patient.  She has some paravertebral left-sided discomfort.  I have advised if her symptoms persist she may schedule an appointment with the neurosurgeon.  She had previous fusion.  The plan of care was discussed as noted above.  Pollyann Savoy, MD Note - This record has been created using Animal [employer].  Chart creation errors have been sought, but may not always  have been located. Such creation errors do not reflect on  the standard of medical care.

## 2018-12-22 ENCOUNTER — Ambulatory Visit: Payer: Self-pay

## 2018-12-22 ENCOUNTER — Encounter: Payer: Self-pay | Admitting: Physician Assistant

## 2018-12-22 ENCOUNTER — Other Ambulatory Visit: Payer: Self-pay

## 2018-12-22 ENCOUNTER — Ambulatory Visit (INDEPENDENT_AMBULATORY_CARE_PROVIDER_SITE_OTHER): Payer: Medicare Other | Admitting: Rheumatology

## 2018-12-22 VITALS — BP 120/75 | HR 94 | Resp 15 | Ht 63.0 in | Wt 193.8 lb

## 2018-12-22 DIAGNOSIS — Z8781 Personal history of (healed) traumatic fracture: Secondary | ICD-10-CM

## 2018-12-22 DIAGNOSIS — M4316 Spondylolisthesis, lumbar region: Secondary | ICD-10-CM | POA: Diagnosis not present

## 2018-12-22 DIAGNOSIS — Z79899 Other long term (current) drug therapy: Secondary | ICD-10-CM | POA: Diagnosis not present

## 2018-12-22 DIAGNOSIS — Z8659 Personal history of other mental and behavioral disorders: Secondary | ICD-10-CM

## 2018-12-22 DIAGNOSIS — M5136 Other intervertebral disc degeneration, lumbar region: Secondary | ICD-10-CM

## 2018-12-22 DIAGNOSIS — M8589 Other specified disorders of bone density and structure, multiple sites: Secondary | ICD-10-CM

## 2018-12-22 DIAGNOSIS — M19041 Primary osteoarthritis, right hand: Secondary | ICD-10-CM | POA: Diagnosis not present

## 2018-12-22 DIAGNOSIS — Z8639 Personal history of other endocrine, nutritional and metabolic disease: Secondary | ICD-10-CM

## 2018-12-22 DIAGNOSIS — M0579 Rheumatoid arthritis with rheumatoid factor of multiple sites without organ or systems involvement: Secondary | ICD-10-CM

## 2018-12-22 DIAGNOSIS — I10 Essential (primary) hypertension: Secondary | ICD-10-CM

## 2018-12-22 DIAGNOSIS — G8929 Other chronic pain: Secondary | ICD-10-CM

## 2018-12-22 DIAGNOSIS — M19072 Primary osteoarthritis, left ankle and foot: Secondary | ICD-10-CM

## 2018-12-22 DIAGNOSIS — Z8719 Personal history of other diseases of the digestive system: Secondary | ICD-10-CM

## 2018-12-22 DIAGNOSIS — M545 Low back pain: Secondary | ICD-10-CM

## 2018-12-22 DIAGNOSIS — Z8709 Personal history of other diseases of the respiratory system: Secondary | ICD-10-CM

## 2018-12-22 DIAGNOSIS — M19071 Primary osteoarthritis, right ankle and foot: Secondary | ICD-10-CM

## 2018-12-22 DIAGNOSIS — M19042 Primary osteoarthritis, left hand: Secondary | ICD-10-CM

## 2018-12-22 DIAGNOSIS — M170 Bilateral primary osteoarthritis of knee: Secondary | ICD-10-CM | POA: Diagnosis not present

## 2018-12-22 DIAGNOSIS — F988 Other specified behavioral and emotional disorders with onset usually occurring in childhood and adolescence: Secondary | ICD-10-CM

## 2018-12-22 DIAGNOSIS — F17200 Nicotine dependence, unspecified, uncomplicated: Secondary | ICD-10-CM

## 2018-12-22 DIAGNOSIS — M51369 Other intervertebral disc degeneration, lumbar region without mention of lumbar back pain or lower extremity pain: Secondary | ICD-10-CM

## 2018-12-22 NOTE — Patient Instructions (Addendum)
Standing Labs We placed an order today for your standing lab work.    Please come back and get your standing labs in December and every 3 months   We have open lab daily Monday through Thursday from 8:30-12:30 PM and 1:30-4:30 PM and Friday from 8:30-12:30 PM and 1:30-4:00 PM at the office of Dr. Pollyann Savoy.   You may experience shorter wait times on Monday and Friday afternoons. The office is located at [address], [place], [place] [address] No appointment is necessary.   Labs are drawn by [facility].  You may receive a bill from [facility] for your lab work.  If you wish to have your labs drawn at another location, please call the office 24 hours in advance to send orders.  If you have any questions regarding directions or hours of operation,  please call [phone].   Just as a reminder please drink plenty of water prior to coming for your lab work. Thanks!   Hand Exercises Hand exercises can be helpful for almost anyone. These exercises can strengthen the hands, improve flexibility and movement, and increase blood flow to the hands. These results can make work and daily tasks easier. Hand exercises can be especially helpful for people who have joint pain from arthritis or have nerve damage from overuse (carpal tunnel syndrome). These exercises can also help people who have injured a hand. Exercises Most of these hand exercises are gentle stretching and motion exercises. It is usually safe to do them often throughout the day. Warming up your hands before exercise may help to reduce stiffness. You can do this with gentle massage or by placing your hands in warm water for 10-15 minutes. It is normal to feel some stretching, pulling, tightness, or mild discomfort as you begin new exercises. This will gradually improve. Stop an exercise right away if you feel sudden, severe pain or your pain gets worse. Ask your health care provider which exercises are best for you. Knuckle  bend or "claw" fist 1. Stand or sit with your arm, hand, and all five fingers pointed straight up. Make sure to keep your wrist straight during the exercise. 2. Gently bend your fingers down toward your palm until the tips of your fingers are touching the top of your palm. Keep your big knuckle straight and just bend the small knuckles in your fingers. 3. Hold this position for __________ seconds. 4. Straighten (extend) your fingers back to the starting position. Repeat this exercise 5-10 times with each hand. Full finger fist 1. Stand or sit with your arm, hand, and all five fingers pointed straight up. Make sure to keep your wrist straight during the exercise. 2. Gently bend your fingers into your palm until the tips of your fingers are touching the middle of your palm. 3. Hold this position for __________ seconds. 4. Extend your fingers back to the starting position, stretching every joint fully. Repeat this exercise 5-10 times with each hand. Straight fist 1. Stand or sit with your arm, hand, and all five fingers pointed straight up. Make sure to keep your wrist straight during the exercise. 2. Gently bend your fingers at the big knuckle, where your fingers meet your hand, and the middle knuckle. Keep the knuckle at the tips of your fingers straight and try to touch the bottom of your palm. 3. Hold this position for __________ seconds. 4. Extend your fingers back to the starting position, stretching every joint fully. Repeat this exercise 5-10 times with each  hand. Tabletop 1. Stand or sit with your arm, hand, and all five fingers pointed straight up. Make sure to keep your wrist straight during the exercise. 2. Gently bend your fingers at the big knuckle, where your fingers meet your hand, as far down as you can while keeping the small knuckles in your fingers straight. Think of forming a tabletop with your fingers. 3. Hold this position for __________ seconds. 4. Extend your fingers back  to the starting position, stretching every joint fully. Repeat this exercise 5-10 times with each hand. Finger spread 1. Place your hand flat on a table with your palm facing down. Make sure your wrist stays straight as you do this exercise. 2. Spread your fingers and thumb apart from each other as far as you can until you feel a gentle stretch. Hold this position for __________ seconds. 3. Bring your fingers and thumb tight together again. Hold this position for __________ seconds. Repeat this exercise 5-10 times with each hand. Making circles 1. Stand or sit with your arm, hand, and all five fingers pointed straight up. Make sure to keep your wrist straight during the exercise. 2. Make a circle by touching the tip of your thumb to the tip of your index finger. 3. Hold for __________ seconds. Then open your hand wide. 4. Repeat this motion with your thumb and each finger on your hand. Repeat this exercise 5-10 times with each hand. Thumb motion 1. Sit with your forearm resting on a table and your wrist straight. Your thumb should be facing up toward the ceiling. Keep your fingers relaxed as you move your thumb. 2. Lift your thumb up as high as you can toward the ceiling. Hold for __________ seconds. 3. Bend your thumb across your palm as far as you can, reaching the tip of your thumb for the small finger (pinkie) side of your palm. Hold for __________ seconds. Repeat this exercise 5-10 times with each hand. Grip strengthening  1. Hold a stress ball or other soft ball in the middle of your hand. 2. Slowly increase the pressure, squeezing the ball as much as you can without causing pain. Think of bringing the tips of your fingers into the middle of your palm. All of your finger joints should bend when doing this exercise. 3. Hold your squeeze for __________ seconds, then relax. Repeat this exercise 5-10 times with each hand. Contact a health care provider if:  Your hand pain or discomfort  gets much worse when you do an exercise.  Your hand pain or discomfort does not improve within 2 hours after you exercise. If you have any of these problems, stop doing these exercises right away. Do not do them again unless your health care provider says that you can. Get help right away if:  You develop sudden, severe hand pain or swelling. If this happens, stop doing these exercises right away. Do not do them again unless your health care provider says that you can. This information is not intended to replace advice given to you by your health care provider. Make sure you discuss any questions you have with your health care provider. Document Released: 01/03/2015 Document Revised: 05/15/2018 Document Reviewed: 01/23/2018 Elsevier Patient Education  2020 [employer]  Back Exercises These exercises help to make your trunk and back strong. They also help to keep the lower back flexible. Doing these exercises can help to prevent back pain or lessen existing pain.  If you have back pain, try to do  these exercises 2-3 times each day or as told by your doctor.  As you get better, do the exercises once each day. Repeat the exercises more often as told by your doctor.  To stop back pain from coming back, do the exercises once each day, or as told by your doctor. Exercises Single knee to chest Do these steps 3-5 times in a row for each leg: 5. Lie on your back on a firm bed or the floor with your legs stretched out. 6. Bring one knee to your chest. 7. Grab your knee or thigh with both hands and hold them it in place. 8. Pull on your knee until you feel a gentle stretch in your lower back or buttocks. 9. Keep doing the stretch for 10-30 seconds. 10. Slowly let go of your leg and straighten it. Pelvic tilt Do these steps 5-10 times in a row: 5. Lie on your back on a firm bed or the floor with your legs stretched out. 6. Bend your knees so they point up to the ceiling. Your feet should be  flat on the floor. 7. Tighten your lower belly (abdomen) muscles to press your lower back against the floor. This will make your tailbone point up to the ceiling instead of pointing down to your feet or the floor. 8. Stay in this position for 5-10 seconds while you gently tighten your muscles and breathe evenly. Cat-cow Do these steps until your lower back bends more easily: 5. Get on your hands and knees on a firm surface. Keep your hands under your shoulders, and keep your knees under your hips. You may put padding under your knees. 6. Let your head hang down toward your chest. Tighten (contract) the muscles in your belly. Point your tailbone toward the floor so your lower back becomes rounded like the back of a cat. 7. Stay in this position for 5 seconds. 8. Slowly lift your head. Let the muscles of your belly relax. Point your tailbone up toward the ceiling so your back forms a sagging arch like the back of a cow. 9. Stay in this position for 5 seconds.  Press-ups Do these steps 5-10 times in a row: 4. Lie on your belly (face-down) on the floor. 5. Place your hands near your head, about shoulder-width apart. 6. While you keep your back relaxed and keep your hips on the floor, slowly straighten your arms to raise the top half of your body and lift your shoulders. Do not use your back muscles. You may change where you place your hands in order to make yourself more comfortable. 7. Stay in this position for 5 seconds. 8. Slowly return to lying flat on the floor.  Bridges Do these steps 10 times in a row: 5. Lie on your back on a firm surface. 6. Bend your knees so they point up to the ceiling. Your feet should be flat on the floor. Your arms should be flat at your sides, next to your body. 7. Tighten your butt muscles and lift your butt off the floor until your waist is almost as high as your knees. If you do not feel the muscles working in your butt and the back of your thighs, slide your  feet 1-2 inches farther away from your butt. 8. Stay in this position for 3-5 seconds. 9. Slowly lower your butt to the floor, and let your butt muscles relax. If this exercise is too easy, try doing it with your arms crossed over your  chest. Belly crunches Do these steps 5-10 times in a row: 4. Lie on your back on a firm bed or the floor with your legs stretched out. 5. Bend your knees so they point up to the ceiling. Your feet should be flat on the floor. 6. Cross your arms over your chest. 7. Tip your chin a little bit toward your chest but do not bend your neck. 8. Tighten your belly muscles and slowly raise your chest just enough to lift your shoulder blades a tiny bit off of the floor. Avoid raising your body higher than that, because it can put too much stress on your low back. 9. Slowly lower your chest and your head to the floor. Back lifts Do these steps 5-10 times in a row: 4. Lie on your belly (face-down) with your arms at your sides, and rest your forehead on the floor. 5. Tighten the muscles in your legs and your butt. 6. Slowly lift your chest off of the floor while you keep your hips on the floor. Keep the back of your head in line with the curve in your back. Look at the floor while you do this. 7. Stay in this position for 3-5 seconds. 8. Slowly lower your chest and your face to the floor. Contact a doctor if:  Your back pain gets a lot worse when you do an exercise.  Your back pain does not get better 2 hours after you exercise. If you have any of these problems, stop doing the exercises. Do not do them again unless your doctor says it is okay. Get help right away if:  You have sudden, very bad back pain. If this happens, stop doing the exercises. Do not do them again unless your doctor says it is okay. This information is not intended to replace advice given to you by your health care provider. Make sure you discuss any questions you have with your health care  provider. Document Released: 02/24/2010 Document Revised: 10/17/2017 Document Reviewed: 10/17/2017 Elsevier Patient Education  2020 [employer].

## 2019-01-13 ENCOUNTER — Other Ambulatory Visit: Payer: Self-pay

## 2019-01-13 ENCOUNTER — Telehealth: Payer: Self-pay | Admitting: Rheumatology

## 2019-01-13 DIAGNOSIS — Z79899 Other long term (current) drug therapy: Secondary | ICD-10-CM

## 2019-01-13 MED ORDER — SULFASALAZINE 500 MG PO TABS: ORAL_TABLET | ORAL | 0 refills | Status: DC

## 2019-01-13 NOTE — Telephone Encounter (Signed)
Last Visit: 12/22/2018 Next Visit: 05/25/2019 Labs: 10/07/2018 Labs are stable. Mild anemia noted.   Patient is going for labs today. Orders have been released.   Okay to refill per Dr. Estanislado Pandy.

## 2019-01-13 NOTE — Telephone Encounter (Signed)
Patient request refill on Sulfasalazine sent to [facility]. Patient getting labs drawn today.

## 2019-01-14 LAB — COMPLETE METABOLIC PANEL WITH GFR
AG Ratio: 2.4 (calc) (ref 1.0–2.5)
ALT: 13 U/L (ref 6–29)
AST: 14 U/L (ref 10–35)
Albumin: 4.3 g/dL (ref 3.6–5.1)
Alkaline phosphatase (APISO): 45 U/L (ref 37–153)
BUN: 20 mg/dL (ref 7–25)
CO2: 17 mmol/L — ABNORMAL LOW (ref 20–32)
Calcium: 9.1 mg/dL (ref 8.6–10.4)
Chloride: 111 mmol/L — ABNORMAL HIGH (ref 98–110)
Creat: 0.99 mg/dL (ref 0.50–0.99)
GFR, Est African American: 69 mL/min/{1.73_m2} (ref >=60)
GFR, Est Non African American: 59 mL/min/{1.73_m2} — ABNORMAL LOW (ref >=60)
Globulin: 1.8 g/dL (calc) — ABNORMAL LOW (ref 1.9–3.7)
Glucose, Bld: 71 mg/dL (ref 65–99)
Potassium: 4.4 mmol/L (ref 3.5–5.3)
Sodium: 142 mmol/L (ref 135–146)
Total Bilirubin: 0.2 mg/dL (ref 0.2–1.2)
Total Protein: 6.1 g/dL (ref 6.1–8.1)

## 2019-01-14 LAB — CBC WITH DIFFERENTIAL/PLATELET
Absolute Monocytes: 607 cells/uL (ref 200–950)
Basophils Absolute: 98 cells/uL (ref 0–200)
Basophils Relative: 1.2 %
Eosinophils Absolute: 180 cells/uL (ref 15–500)
Eosinophils Relative: 2.2 %
HCT: 33 % — ABNORMAL LOW (ref 35.0–45.0)
Hemoglobin: 10.3 g/dL — ABNORMAL LOW (ref 11.7–15.5)
Lymphs Abs: 2165 cells/uL (ref 850–3900)
MCH: 25.2 pg — ABNORMAL LOW (ref 27.0–33.0)
MCHC: 31.2 g/dL — ABNORMAL LOW (ref 32.0–36.0)
MCV: 80.9 fL (ref 80.0–100.0)
MPV: 9.5 fL (ref 7.5–12.5)
Monocytes Relative: 7.4 %
Neutro Abs: 5150 cells/uL (ref 1500–7800)
Neutrophils Relative %: 62.8 %
Platelets: 290 10*3/uL (ref 140–400)
RBC: 4.08 10*6/uL (ref 3.80–5.10)
RDW: 17.2 % — ABNORMAL HIGH (ref 11.0–15.0)
Total Lymphocyte: 26.4 %
WBC: 8.2 10*3/uL (ref 3.8–10.8)

## 2019-01-14 NOTE — Progress Notes (Signed)
Labs are stable.  Hemoglobin is low but is stable.  Please forward labs to her PCP.

## 2019-04-02 ENCOUNTER — Other Ambulatory Visit: Payer: Self-pay | Admitting: *Deleted

## 2019-04-02 MED ORDER — SULFASALAZINE 500 MG PO TABS: ORAL_TABLET | ORAL | 0 refills | Status: DC

## 2019-04-02 NOTE — Telephone Encounter (Signed)
Refill request received via fax  Last Visit: 12/22/2018 Next Visit: 05/25/2019 Labs: 01/13/2019 Labs are stable. Hemoglobin is low but is stable  Okay to refill per Dr. Corliss Skains

## 2019-04-15 ENCOUNTER — Other Ambulatory Visit: Payer: Self-pay

## 2019-04-15 DIAGNOSIS — Z79899 Other long term (current) drug therapy: Secondary | ICD-10-CM

## 2019-04-16 LAB — CBC WITH DIFFERENTIAL/PLATELET
Absolute Monocytes: 547 cells/uL (ref 200–950)
Basophils Absolute: 72 cells/uL (ref 0–200)
Basophils Relative: 1 %
Eosinophils Absolute: 101 cells/uL (ref 15–500)
Eosinophils Relative: 1.4 %
HCT: 33.2 % — ABNORMAL LOW (ref 35.0–45.0)
Hemoglobin: 9.9 g/dL — ABNORMAL LOW (ref 11.7–15.5)
Lymphs Abs: 1584 cells/uL (ref 850–3900)
MCH: 23.9 pg — ABNORMAL LOW (ref 27.0–33.0)
MCHC: 29.8 g/dL — ABNORMAL LOW (ref 32.0–36.0)
MCV: 80.2 fL (ref 80.0–100.0)
MPV: 9.1 fL (ref 7.5–12.5)
Monocytes Relative: 7.6 %
Neutro Abs: 4896 cells/uL (ref 1500–7800)
Neutrophils Relative %: 68 %
Platelets: 280 10*3/uL (ref 140–400)
RBC: 4.14 10*6/uL (ref 3.80–5.10)
RDW: 17.2 % — ABNORMAL HIGH (ref 11.0–15.0)
Total Lymphocyte: 22 %
WBC: 7.2 10*3/uL (ref 3.8–10.8)

## 2019-04-16 LAB — COMPLETE METABOLIC PANEL WITH GFR
AG Ratio: 2 (calc) (ref 1.0–2.5)
ALT: 10 U/L (ref 6–29)
AST: 10 U/L (ref 10–35)
Albumin: 4.3 g/dL (ref 3.6–5.1)
Alkaline phosphatase (APISO): 58 U/L (ref 37–153)
BUN/Creatinine Ratio: 17 (calc) (ref 6–22)
BUN: 17 mg/dL (ref 7–25)
CO2: 18 mmol/L — ABNORMAL LOW (ref 20–32)
Calcium: 9.1 mg/dL (ref 8.6–10.4)
Chloride: 110 mmol/L (ref 98–110)
Creat: 1.01 mg/dL — ABNORMAL HIGH (ref 0.50–0.99)
GFR, Est African American: 67 mL/min/{1.73_m2} (ref >=60)
GFR, Est Non African American: 58 mL/min/{1.73_m2} — ABNORMAL LOW (ref >=60)
Globulin: 2.1 g/dL (calc) (ref 1.9–3.7)
Glucose, Bld: 150 mg/dL — ABNORMAL HIGH (ref 65–99)
Potassium: 4.6 mmol/L (ref 3.5–5.3)
Sodium: 141 mmol/L (ref 135–146)
Total Bilirubin: 0.2 mg/dL (ref 0.2–1.2)
Total Protein: 6.4 g/dL (ref 6.1–8.1)

## 2019-04-16 NOTE — Progress Notes (Signed)
Glucose is elevated, probably not fasting.  Creatinine is low but is stable.  Anemia is worse.  Please have patient contact her PCP.  Please forward labs to her PCP.

## 2019-05-15 NOTE — Progress Notes (Signed)
Office Visit Note  Patient: Margaret [name]             Date of Birth: 1950-08-22           MRN: [id number]             PCP: Kirstie Peri, MD Referring: Kirstie Peri, MD Visit Date: 05/25/2019 Occupation: @GUAROCC @  Subjective:  Pain in both hands   History of Present Illness: Margaret [name] is a 69 y.o. female with history of seropositive rheumatoid arthritis, osteoarthritis, and DDD.  She is taking sulfasalazine 500 mg 2 tablets in the morning and 1 tablet in the evening.  She has not missed any doses of sulfasalazine recently.  She denies any recent rheumatoid arthritis flares.  She continues to experience pain in bilateral first MCP joints.  She experiences intermittent discomfort in both knees and both ankle joints.  She denies any joint swelling.  She continues to have chronic pain in her neck and lower back.  Her neck pain is exacerbated by walking prolonged distances.  She continues to take tart cherry, turmeric, and ginger on a daily basis.    Activities of Daily Living:  Patient reports morning stiffness for 5 minutes.   Patient Denies nocturnal pain.  Difficulty dressing/grooming: Denies Difficulty climbing stairs: Denies Difficulty getting out of chair: Denies Difficulty using hands for taps, buttons, cutlery, and/or writing: Denies  Review of Systems  Constitutional: Positive for fatigue.  HENT: Positive for mouth sores and mouth dryness. Negative for nose dryness.   Eyes: Positive for dryness. Negative for pain, itching and visual disturbance.  Respiratory: Negative for cough, hemoptysis, shortness of breath and difficulty breathing.   Cardiovascular: Negative for chest pain, palpitations, hypertension and swelling in legs/feet.  Gastrointestinal: Negative for blood in stool, constipation and diarrhea.  Endocrine: Negative for increased urination.  Genitourinary: Negative for difficulty urinating and painful urination.  Musculoskeletal: Positive for arthralgias, joint  pain, myalgias, morning stiffness, muscle tenderness and myalgias. Negative for joint swelling and muscle weakness.  Skin: Positive for rash. Negative for color change, pallor, hair loss, nodules/bumps, redness, skin tightness, ulcers and sensitivity to sunlight.  Allergic/Immunologic: Negative for susceptible to infections.  Neurological: Negative for dizziness, numbness, headaches, memory loss and weakness.  Hematological: Negative for bruising/bleeding tendency and swollen glands.  Psychiatric/Behavioral: Positive for sleep disturbance. Negative for depressed mood and confusion. The patient is not nervous/anxious.     PMFS History:  Patient Active Problem List   Diagnosis Date Noted  . Idiopathic chronic venous hypertension of both lower extremities with inflammation 06/18/2017  . Closed displaced oblique fracture of shaft of right humerus 03/27/2017  . Smoker 01/09/2016  . High risk medication use 01/06/2016  . RA (rheumatoid arthritis) (HCC) 01/05/2016  . DDD (degenerative disc disease), lumbar 01/05/2016  . Calcaneal spur 01/05/2016  . Bipolar disorder (HCC) 01/05/2016  . ADD (attention deficit disorder) 01/05/2016  . Sleep apnea 01/05/2016  . TMJ (dislocation of temporomandibular joint) 01/05/2016  . Pedal edema 01/05/2016  . Osteoarthritis of both hands 01/05/2016  . Osteoarthritis of both feet 01/05/2016  . Osteoarthritis of both knees 01/05/2016  . GERD (gastroesophageal reflux disease) 06/24/2015  . Constipation 06/24/2015  . Spondylolisthesis at L4-L5 level 01/24/2015  . 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 CONTRACTURE OF SHOULDER JOINT 04/24/2007  . DIABETES 07/30/2006    Past Medical History:  Diagnosis Date  . ADD (attention deficit disorder) 01/05/2016  . Arthritis   . Asthma   . Benign essential hypertension   .  Bipolar disorder [facility])    hospitalized at [facility] for manic episodes  . Calcaneal spur 01/05/2016  . DDD (degenerative disc disease), lumbar 01/05/2016    . Diabetes mellitus without complication ([place])   . Diabetic neuropathy ([place])   . Dysrhythmia    takes diltiazem prescribed by medical doctor; has never seen cardiologist  . Eczema   . Gastroesophageal reflux disease   . Humeral shaft fracture    right  . Hyperlipidemia   . Hypersomnia   . IBS (irritable bowel syndrome)   . Myalgia   . Noncompliance with CPAP treatment   . Osteoarthritis of both feet 01/05/2016  . Osteoarthritis of both hands 01/05/2016  . Osteoarthritis of both knees 01/05/2016  . Pedal edema 01/05/2016  . Peripheral vascular disease ([place])   . RA (rheumatoid arthritis) ([place]) 01/05/2016   +RF, +CCP, Nodulous,   . Rheumatoid arthritis ([place])   . Sleep apnea 2015   home sleep study   . TMJ (dislocation of temporomandibular joint) 01/05/2016  . Varicose veins     Family History  Problem Relation Age of Onset  . Hypertension Father   . Alcoholism Mother   . Stroke Mother   . AAA (abdominal aortic aneurysm) Brother   . Gallstones Sister   . Osteoporosis Sister   . Alcoholism Brother   . Diabetes Brother   . Healthy Son   . Healthy Daughter   . Colon cancer Neg Hx   . Colon polyps Neg Hx    Past Surgical History:  Procedure Laterality Date  . BACK SURGERY    . CESAREAN SECTION    . COLONOSCOPY  2011   Dr. Collene Mares in [place]  . ELBOW SURGERY    . ENDOVENOUS ABLATION SAPHENOUS VEIN W/ LASER    . ORIF HUMERUS FRACTURE Right 03/29/2017   Procedure: OPEN REDUCTION INTERNAL FIXATION (ORIF) RIGHT HUMERAL SHAFT FRACTURE;  Surgeon: Newt Minion, MD;  Location: [facility];  Service: Orthopedics;  Laterality: Right;   Social History   Social History Narrative  . Not on file   Immunization History  Administered Date(s) Administered  . Influenza, High Dose Seasonal PF 12/19/2017  . Tdap 12/19/2017  . Zoster Recombinat (Shingrix) 12/19/2017, 03/03/2018     Objective: Vital Signs: BP 131/70 (BP Location: Right Arm, Patient Position: Sitting, Cuff Size: Normal)    Pulse (!) 101   Resp 14   Ht 5\' 3"  (1.6 m)   Wt 203 lb 3.2 oz (92.2 kg)   BMI 36.00 kg/m    Physical Exam Vitals and nursing note reviewed.  Constitutional:      Appearance: She is well-developed.  HENT:     Head: Normocephalic and atraumatic.  Eyes:     Conjunctiva/sclera: Conjunctivae normal.  Pulmonary:     Effort: Pulmonary effort is normal.  Abdominal:     General: Bowel sounds are normal.     Palpations: Abdomen is soft.  Musculoskeletal:     Cervical back: Normal range of motion.  Lymphadenopathy:     Cervical: No cervical adenopathy.  Skin:    General: Skin is warm and dry.     Capillary Refill: Capillary refill takes less than 2 seconds.  Neurological:     Mental Status: She is alert and oriented to person, place, and time.  Psychiatric:        Behavior: Behavior normal.      Musculoskeletal Exam: C-spine good range of motion.  Limited and painful ROM of lumbar spine. No midline spinal tenderness.  No SI joint tenderness.  Shoulder joints, elbow joints, wrist joints, MCPs and PIPs and DIPs good range of motion with no synovitis.She has a rheumatoid nodule on the extensor surface of the left elbow.  PIP and DIP thickening consistent with osteoarthritis of both hands noted.    She has complete fist formation bilaterally.  She has tenderness of the first MCP joints bilaterally.  Hip joints have good range of motion with no discomfort.  Knee joints have good range of motion with no warmth or effusion.  Ankle joints have good range of motion no tenderness or synovitis.  CDAI Exam: CDAI Score: 3  Patient Global: 5 mm; Provider Global: 5 mm Swollen: 0 ; Tender: 2  Joint Exam 05/25/2019      Right  Left  MCP 1   Tender   Tender     Investigation: No additional findings.  Imaging: No results found.  Recent Labs: Lab Results  Component Value Date   WBC 7.2 04/15/2019   HGB 9.9 (L) 04/15/2019   PLT 280 04/15/2019   NA 141 04/15/2019   K 4.6 04/15/2019    CL 110 04/15/2019   CO2 18 (L) 04/15/2019   GLUCOSE 150 (H) 04/15/2019   BUN 17 04/15/2019   CREATININE 1.01 (H) 04/15/2019   BILITOT 0.2 04/15/2019   ALKPHOS 59 06/07/2016   AST 10 04/15/2019   ALT 10 04/15/2019   PROT 6.4 04/15/2019   ALBUMIN 4.2 06/07/2016   CALCIUM 9.1 04/15/2019   GFRAA 67 04/15/2019    Speciality Comments: No specialty comments available.  Procedures:  No procedures performed Allergies: Fluoxetine hcl (pmdd), Hydroxychloroquine, and Vicodin [hydrocodone-acetaminophen]   Assessment / Plan:     Visit Diagnoses: Rheumatoid arthritis involving multiple sites with positive rheumatoid factor (HCC): She has no synovitis on exam today.  She has tenderness of bilateral first MCP joints but no inflammation was noted.  She experiences intermittent discomfort in both CMC's and first MCP joints especially after performing overuse activities.  She declined scheduling ultrasound-guided cortisone injections.  She uses Aspercreme topically as needed for pain relief.  She has not had any recent rheumatoid arthritis flares.  She is clinically doing well on sulfasalazine 500 mg 2 tablets in the morning and 1 tablet in the evening.  She has not missed any doses of sulfasalazine recently.  She will continue taking sulfasalazine as prescribed.  She does not need any refills at this time.  She was advised to notify us if she develops increased joint pain or joint swelling.  She will follow-up in the office in 5 months.  High risk medication use - Sulfasalazine 500 mg 2 tablets in the morning and 1 tablet in the evening.  CBC and CMP were drawn on 04/15/2019.  Lab work was reviewed with the patient today in the office.  She will be due to update lab work in June and every 3 months.  Standing orders are in place.  Primary osteoarthritis of both hands: She has PIP and DIP thickening consistent with osteoarthritis of both hands.  She has tenderness of the first MCP joints bilaterally.  No  synovitis was noted.  She has complete fist formation bilaterally.  Joint protection and muscle strengthening were discussed.  She uses Aspercreme topically as needed for pain relief.  She will continue taking natural anti-inflammatories as previously discussed.  Primary osteoarthritis of both knees: She has good range of motion of both knee joints on exam.  No warmth or effusion was noted.  She experiences some stiffness in both knees after sitting for prolonged periods of time.  Primary osteoarthritis of both feet: She has good range of motion of both ankle joints with no discomfort.  She has no discomfort in her feet at this time.  She was proper fitting shoes.  DDD (degenerative disc disease), lumbar: She has limited range of motion of the lumbar spine with discomfort.  No midline spinal tenderness on exam today.  She experiences intermittent symptoms of left-sided radiculopathy if she walks for long distances.  Spondylolisthesis at L4-L5 level: Chronic pain.  Osteopenia of multiple sites  Other medical conditions are listed as follows:  Attention deficit disorder, unspecified hyperactivity presence  History of bipolar disorder  Essential hypertension  History of asthma  History of diabetes mellitus  History of humerus fracture  History of IBS  History of gastroesophageal reflux (GERD)  Smoker  Orders: No orders of the defined types were placed in this encounter.  No orders of the defined types were placed in this encounter.    Follow-Up Instructions: Return in about 5 months (around 10/25/2019) for Rheumatoid arthritis, Osteoarthritis, DDD.   Gearldine Bienenstock, PA-C  Note - This record has been created using Dragon software.  Chart creation errors have been sought, but may not always  have been located. Such creation errors do not reflect on  the standard of medical care.

## 2019-05-25 ENCOUNTER — Encounter: Payer: Self-pay | Admitting: Physician Assistant

## 2019-05-25 ENCOUNTER — Other Ambulatory Visit: Payer: Self-pay

## 2019-05-25 ENCOUNTER — Ambulatory Visit: Payer: Medicare PPO | Admitting: Physician Assistant

## 2019-05-25 VITALS — BP 131/70 | HR 101 | Resp 14 | Ht 63.0 in | Wt 203.2 lb

## 2019-05-25 DIAGNOSIS — Z8709 Personal history of other diseases of the respiratory system: Secondary | ICD-10-CM

## 2019-05-25 DIAGNOSIS — M19072 Primary osteoarthritis, left ankle and foot: Secondary | ICD-10-CM

## 2019-05-25 DIAGNOSIS — M19041 Primary osteoarthritis, right hand: Secondary | ICD-10-CM | POA: Diagnosis not present

## 2019-05-25 DIAGNOSIS — M19042 Primary osteoarthritis, left hand: Secondary | ICD-10-CM

## 2019-05-25 DIAGNOSIS — M0579 Rheumatoid arthritis with rheumatoid factor of multiple sites without organ or systems involvement: Secondary | ICD-10-CM

## 2019-05-25 DIAGNOSIS — Z79899 Other long term (current) drug therapy: Secondary | ICD-10-CM

## 2019-05-25 DIAGNOSIS — Z8659 Personal history of other mental and behavioral disorders: Secondary | ICD-10-CM

## 2019-05-25 DIAGNOSIS — M19071 Primary osteoarthritis, right ankle and foot: Secondary | ICD-10-CM

## 2019-05-25 DIAGNOSIS — F988 Other specified behavioral and emotional disorders with onset usually occurring in childhood and adolescence: Secondary | ICD-10-CM

## 2019-05-25 DIAGNOSIS — M8589 Other specified disorders of bone density and structure, multiple sites: Secondary | ICD-10-CM

## 2019-05-25 DIAGNOSIS — Z8719 Personal history of other diseases of the digestive system: Secondary | ICD-10-CM

## 2019-05-25 DIAGNOSIS — M4316 Spondylolisthesis, lumbar region: Secondary | ICD-10-CM

## 2019-05-25 DIAGNOSIS — M170 Bilateral primary osteoarthritis of knee: Secondary | ICD-10-CM

## 2019-05-25 DIAGNOSIS — F17200 Nicotine dependence, unspecified, uncomplicated: Secondary | ICD-10-CM

## 2019-05-25 DIAGNOSIS — M5136 Other intervertebral disc degeneration, lumbar region: Secondary | ICD-10-CM

## 2019-05-25 DIAGNOSIS — I10 Essential (primary) hypertension: Secondary | ICD-10-CM

## 2019-05-25 DIAGNOSIS — Z8639 Personal history of other endocrine, nutritional and metabolic disease: Secondary | ICD-10-CM

## 2019-05-25 DIAGNOSIS — Z8781 Personal history of (healed) traumatic fracture: Secondary | ICD-10-CM

## 2019-05-25 NOTE — Patient Instructions (Signed)
Standing Labs We placed an order today for your standing lab work.    Please come back and get your standing labs in June and every 3 months   We have open lab daily Monday through Thursday from 8:30-12:30 PM and 1:30-4:30 PM and Friday from 8:30-12:30 PM and 1:30-4:00 PM at the office of Dr. Shaili Deveshwar.   You may experience shorter wait times on Monday and Friday afternoons. The office is located at [address], [address], [place], [place] [address] No appointment is necessary.   Labs are drawn by [facility].  You may receive a bill from [facility] for your lab work.  If you wish to have your labs drawn at another location, please call the office 24 hours in advance to send orders.  If you have any questions regarding directions or hours of operation,  please call [phone].   Just as a reminder please drink plenty of water prior to coming for your lab work. Thanks!   

## 2019-07-02 ENCOUNTER — Other Ambulatory Visit: Payer: Self-pay | Admitting: *Deleted

## 2019-07-02 MED ORDER — SULFASALAZINE 500 MG PO TABS: ORAL_TABLET | ORAL | 0 refills | Status: DC

## 2019-07-02 NOTE — Telephone Encounter (Signed)
Refill request received via fax  Last Visit: 05/25/2019 Next Visit: 10/26/2019 Labs: 04/15/2019 Glucose is elevated, probably not fasting. Creatinine is low but is stable. Anemia is worse.  Current Dose per office note 05/25/2019: 500 mg 2 tablets in the morning and 1 tablet in the evening.  Okay to refill per Dr. Corliss Skains

## 2019-07-03 DIAGNOSIS — Z299 Encounter for prophylactic measures, unspecified: Secondary | ICD-10-CM | POA: Diagnosis not present

## 2019-07-03 DIAGNOSIS — I10 Essential (primary) hypertension: Secondary | ICD-10-CM | POA: Diagnosis not present

## 2019-07-03 DIAGNOSIS — F17210 Nicotine dependence, cigarettes, uncomplicated: Secondary | ICD-10-CM | POA: Diagnosis not present

## 2019-07-03 DIAGNOSIS — E1129 Type 2 diabetes mellitus with other diabetic kidney complication: Secondary | ICD-10-CM | POA: Diagnosis not present

## 2019-07-03 DIAGNOSIS — N183 Chronic kidney disease, stage 3 unspecified: Secondary | ICD-10-CM | POA: Diagnosis not present

## 2019-07-03 DIAGNOSIS — E1165 Type 2 diabetes mellitus with hyperglycemia: Secondary | ICD-10-CM | POA: Diagnosis not present

## 2019-07-03 DIAGNOSIS — R220 Localized swelling, mass and lump, head: Secondary | ICD-10-CM | POA: Diagnosis not present

## 2019-07-10 DIAGNOSIS — R197 Diarrhea, unspecified: Secondary | ICD-10-CM | POA: Diagnosis not present

## 2019-07-30 DIAGNOSIS — Z79899 Other long term (current) drug therapy: Secondary | ICD-10-CM | POA: Diagnosis not present

## 2019-07-30 DIAGNOSIS — E1165 Type 2 diabetes mellitus with hyperglycemia: Secondary | ICD-10-CM | POA: Diagnosis not present

## 2019-07-30 DIAGNOSIS — R5383 Other fatigue: Secondary | ICD-10-CM | POA: Diagnosis not present

## 2019-07-30 DIAGNOSIS — Z299 Encounter for prophylactic measures, unspecified: Secondary | ICD-10-CM | POA: Diagnosis not present

## 2019-07-30 DIAGNOSIS — R197 Diarrhea, unspecified: Secondary | ICD-10-CM | POA: Diagnosis not present

## 2019-07-30 DIAGNOSIS — F17210 Nicotine dependence, cigarettes, uncomplicated: Secondary | ICD-10-CM | POA: Diagnosis not present

## 2019-07-30 DIAGNOSIS — Z6839 Body mass index (BMI) 39.0-39.9, adult: Secondary | ICD-10-CM | POA: Diagnosis not present

## 2019-07-30 DIAGNOSIS — E7800 Pure hypercholesterolemia, unspecified: Secondary | ICD-10-CM | POA: Diagnosis not present

## 2019-07-31 DIAGNOSIS — E7800 Pure hypercholesterolemia, unspecified: Secondary | ICD-10-CM | POA: Diagnosis not present

## 2019-07-31 DIAGNOSIS — R5383 Other fatigue: Secondary | ICD-10-CM | POA: Diagnosis not present

## 2019-07-31 DIAGNOSIS — Z79899 Other long term (current) drug therapy: Secondary | ICD-10-CM | POA: Diagnosis not present

## 2019-07-31 DIAGNOSIS — E559 Vitamin D deficiency, unspecified: Secondary | ICD-10-CM | POA: Diagnosis not present

## 2019-08-07 DIAGNOSIS — Z1339 Encounter for screening examination for other mental health and behavioral disorders: Secondary | ICD-10-CM | POA: Diagnosis not present

## 2019-08-07 DIAGNOSIS — Z1211 Encounter for screening for malignant neoplasm of colon: Secondary | ICD-10-CM | POA: Diagnosis not present

## 2019-08-07 DIAGNOSIS — Z6836 Body mass index (BMI) 36.0-36.9, adult: Secondary | ICD-10-CM | POA: Diagnosis not present

## 2019-08-07 DIAGNOSIS — E1142 Type 2 diabetes mellitus with diabetic polyneuropathy: Secondary | ICD-10-CM | POA: Diagnosis not present

## 2019-08-07 DIAGNOSIS — Z0000 Encounter for general adult medical examination without abnormal findings: Secondary | ICD-10-CM | POA: Diagnosis not present

## 2019-08-07 DIAGNOSIS — Z1331 Encounter for screening for depression: Secondary | ICD-10-CM | POA: Diagnosis not present

## 2019-08-07 DIAGNOSIS — Z7189 Other specified counseling: Secondary | ICD-10-CM | POA: Diagnosis not present

## 2019-08-07 DIAGNOSIS — E1165 Type 2 diabetes mellitus with hyperglycemia: Secondary | ICD-10-CM | POA: Diagnosis not present

## 2019-08-07 DIAGNOSIS — Z299 Encounter for prophylactic measures, unspecified: Secondary | ICD-10-CM | POA: Diagnosis not present

## 2019-08-12 ENCOUNTER — Telehealth: Payer: Self-pay | Admitting: Rheumatology

## 2019-08-12 NOTE — Telephone Encounter (Signed)
Patient had lab draw at PCP's office. Dr. Clelia Croft was to send results here. Patient wants to see if we received those result?  Please call to advise.

## 2019-08-12 NOTE — Telephone Encounter (Signed)
Patient advised we have note received her lab work form her PCP. Patient states she will call the PCP's office to have them send the lab results.

## 2019-08-14 ENCOUNTER — Telehealth: Payer: Self-pay

## 2019-08-14 NOTE — Telephone Encounter (Signed)
Labs received via fax from Dr. Margaretmary Eddy office.   Draw date: 07/31/2019  CBC, CMP, TSH, LIPID PANEL, VIT D.   Triglycerides 221 Hemoglobin 10.0 MCH 23.3 MCHC 29.4 BUN/Creat ratio 9 Chloride 110 Carbon dioxide 19 Vit d 18.7  Labs reviewed by Sherron Ales, PA-C, noted "was she started on a vit d supplement?"   I called patient and patient states her PCP did start her on a vitamin D supplement.

## 2019-08-18 DIAGNOSIS — R1011 Right upper quadrant pain: Secondary | ICD-10-CM | POA: Diagnosis not present

## 2019-08-18 DIAGNOSIS — N2889 Other specified disorders of kidney and ureter: Secondary | ICD-10-CM | POA: Diagnosis not present

## 2019-08-18 DIAGNOSIS — R197 Diarrhea, unspecified: Secondary | ICD-10-CM | POA: Diagnosis not present

## 2019-09-07 DIAGNOSIS — R197 Diarrhea, unspecified: Secondary | ICD-10-CM | POA: Diagnosis not present

## 2019-09-07 DIAGNOSIS — Z6836 Body mass index (BMI) 36.0-36.9, adult: Secondary | ICD-10-CM | POA: Diagnosis not present

## 2019-09-07 DIAGNOSIS — E1165 Type 2 diabetes mellitus with hyperglycemia: Secondary | ICD-10-CM | POA: Diagnosis not present

## 2019-09-07 DIAGNOSIS — E1122 Type 2 diabetes mellitus with diabetic chronic kidney disease: Secondary | ICD-10-CM | POA: Diagnosis not present

## 2019-09-07 DIAGNOSIS — Z299 Encounter for prophylactic measures, unspecified: Secondary | ICD-10-CM | POA: Diagnosis not present

## 2019-09-07 DIAGNOSIS — E1142 Type 2 diabetes mellitus with diabetic polyneuropathy: Secondary | ICD-10-CM | POA: Diagnosis not present

## 2019-09-09 DIAGNOSIS — F3112 Bipolar disorder, current episode manic without psychotic features, moderate: Secondary | ICD-10-CM | POA: Diagnosis not present

## 2019-09-14 ENCOUNTER — Other Ambulatory Visit: Payer: Self-pay | Admitting: Rheumatology

## 2019-09-14 NOTE — Telephone Encounter (Signed)
Last Visit: 05/25/2019 Next Visit: 10/26/2019 Labs:07/31/2019 Hemoglobin 10.0 MCH 23.3 MCHC 29.4 BUN/Creat ratio 9 Chloride 110 Carbon dioxide 19  Current Dose per office note 05/25/2019: 500 mg 2 tablets in the morning and 1 tablet in the evening.  Okay to refill SSZ?

## 2019-10-01 DIAGNOSIS — R197 Diarrhea, unspecified: Secondary | ICD-10-CM | POA: Diagnosis not present

## 2019-10-01 DIAGNOSIS — R152 Fecal urgency: Secondary | ICD-10-CM | POA: Diagnosis not present

## 2019-10-05 DIAGNOSIS — R197 Diarrhea, unspecified: Secondary | ICD-10-CM | POA: Diagnosis not present

## 2019-10-07 DIAGNOSIS — E1122 Type 2 diabetes mellitus with diabetic chronic kidney disease: Secondary | ICD-10-CM | POA: Diagnosis not present

## 2019-10-07 DIAGNOSIS — I10 Essential (primary) hypertension: Secondary | ICD-10-CM | POA: Diagnosis not present

## 2019-10-07 DIAGNOSIS — E1165 Type 2 diabetes mellitus with hyperglycemia: Secondary | ICD-10-CM | POA: Diagnosis not present

## 2019-10-07 DIAGNOSIS — F17210 Nicotine dependence, cigarettes, uncomplicated: Secondary | ICD-10-CM | POA: Diagnosis not present

## 2019-10-07 DIAGNOSIS — Z6838 Body mass index (BMI) 38.0-38.9, adult: Secondary | ICD-10-CM | POA: Diagnosis not present

## 2019-10-07 DIAGNOSIS — N183 Chronic kidney disease, stage 3 unspecified: Secondary | ICD-10-CM | POA: Diagnosis not present

## 2019-10-07 DIAGNOSIS — Z299 Encounter for prophylactic measures, unspecified: Secondary | ICD-10-CM | POA: Diagnosis not present

## 2019-10-13 NOTE — Progress Notes (Signed)
Office Visit Note  Patient: Margaret [name]             Date of Birth: 12/04/1950           MRN: [id number]             PCP: Kirstie Peri, MD Referring: Kirstie Peri, MD Visit Date: 10/26/2019 Occupation: @GUAROCC @  Subjective:  Right knee joint pain   History of Present Illness: Margaret [name] is a 69 y.o. female with history of seropositive rheumatoid arthritis and osteoarthritis.  Patient is taking sulfasalazine 500 mg 2 tablets in the morning and 1 tablet in the evening. She denies any recent rheumatoid arthritis flares. She has noticed some tenderness on the medial aspect of the right knee for the past 3 weeks.  She denies any injuries, swelling, or warmth. She had visco gel injections performed at another office but did not notice any improvement. She denies missing any doses recently. Patient reports that several months ago she had an abscessed tooth and had to take 2 rounds of antibiotics. She states that she did not hold sulfasalazine during that time. She states that after discontinuing the antibiotics she had severe diarrhea for several weeks. She states that she was evaluated by gastroenterologist who recommends starting on probiotics. She had lab work drawn and was found to have iron deficiency anemia, vitamin B12 deficiency, and vitamin D deficiency. She was started on supplements but has only been taking iron for 4 days. She states that she has had a cold intolerance since being anemic. She has not noticed any improvement in her symptoms since starting supplements. She had updated lab work on 10/01/2019 and will following up with her PCP on Wednesday.   Activities of Daily Living:  Patient reports morning stiffness for 0 minutes.   Patient Reports nocturnal pain.  Difficulty dressing/grooming: Denies Difficulty climbing stairs: Denies Difficulty getting out of chair: Denies Difficulty using hands for taps, buttons, cutlery, and/or writing: Denies  Review of Systems   Constitutional: Negative for fatigue.  HENT: Positive for mouth sores and mouth dryness. Negative for nose dryness.   Eyes: Positive for pain and dryness. Negative for visual disturbance.  Respiratory: Negative for cough, hemoptysis, shortness of breath and difficulty breathing.   Cardiovascular: Negative for chest pain, palpitations, hypertension and swelling in legs/feet.  Gastrointestinal: Negative for blood in stool, constipation and diarrhea.  Endocrine: Negative for increased urination.  Genitourinary: Negative for painful urination.  Musculoskeletal: Positive for arthralgias, joint pain, myalgias and myalgias. Negative for joint swelling, muscle weakness, morning stiffness and muscle tenderness.  Skin: Positive for sensitivity to sunlight. Negative for color change, pallor, rash, hair loss, nodules/bumps, skin tightness and ulcers.  Allergic/Immunologic: Negative for susceptible to infections.  Neurological: Negative for dizziness, numbness, headaches and weakness.  Hematological: Negative for swollen glands.  Psychiatric/Behavioral: Positive for sleep disturbance. Negative for depressed mood. The patient is not nervous/anxious.     PMFS History:  Patient Active Problem List   Diagnosis Date Noted  . Idiopathic chronic venous hypertension of both lower extremities with inflammation 06/18/2017  . Closed displaced oblique fracture of shaft of right humerus 03/27/2017  . Smoker 01/09/2016  . High risk medication use 01/06/2016  . RA (rheumatoid arthritis) (HCC) 01/05/2016  . DDD (degenerative disc disease), lumbar 01/05/2016  . Calcaneal spur 01/05/2016  . Bipolar disorder (HCC) 01/05/2016  . ADD (attention deficit disorder) 01/05/2016  . Sleep apnea 01/05/2016  . TMJ (dislocation of temporomandibular joint) 01/05/2016  . Pedal edema  01/05/2016  . Osteoarthritis of both hands 01/05/2016  . Osteoarthritis of both feet 01/05/2016  . Osteoarthritis of both knees 01/05/2016  .  GERD (gastroesophageal reflux disease) 06/24/2015  . Constipation 06/24/2015  . Spondylolisthesis at L4-L5 level 01/24/2015  . 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 CONTRACTURE OF SHOULDER JOINT 04/24/2007  . DIABETES 07/30/2006    Past Medical History:  Diagnosis Date  . ADD (attention deficit disorder) 01/05/2016  . Arthritis   . Asthma   . Benign essential hypertension   . Bipolar disorder [facility])    hospitalized at [facility] for manic episodes  . Calcaneal spur 01/05/2016  . DDD (degenerative disc disease), lumbar 01/05/2016  . Diabetes mellitus without complication (HCC)   . Diabetic neuropathy (HCC)   . Dysrhythmia    takes diltiazem prescribed by medical doctor; has never seen cardiologist  . Eczema   . Gastroesophageal reflux disease   . Humeral shaft fracture    right  . Hyperlipidemia   . Hypersomnia   . IBS (irritable bowel syndrome)   . Myalgia   . Noncompliance with CPAP treatment   . Osteoarthritis of both feet 01/05/2016  . Osteoarthritis of both hands 01/05/2016  . Osteoarthritis of both knees 01/05/2016  . Pedal edema 01/05/2016  . Peripheral vascular disease (HCC)   . RA (rheumatoid arthritis) (HCC) 01/05/2016   +RF, +CCP, Nodulous,   . Rheumatoid arthritis (HCC)   . Sleep apnea 2015   home sleep study   . TMJ (dislocation of temporomandibular joint) 01/05/2016  . Varicose veins     Family History  Problem Relation Age of Onset  . Hypertension Father   . Alcoholism Mother   . Stroke Mother   . AAA (abdominal aortic aneurysm) Brother   . Gallstones Sister   . Osteoporosis Sister   . Alcoholism Brother   . Diabetes Brother   . Healthy Son   . Healthy Daughter   . Colon cancer Neg Hx   . Colon polyps Neg Hx    Past Surgical History:  Procedure Laterality Date  . BACK SURGERY    . CESAREAN SECTION    . COLONOSCOPY  2011   Dr. Loreta Ave in [place]  . ELBOW SURGERY    . ENDOVENOUS ABLATION SAPHENOUS VEIN W/ LASER    . ORIF HUMERUS FRACTURE Right  03/29/2017   Procedure: OPEN REDUCTION INTERNAL FIXATION (ORIF) RIGHT HUMERAL SHAFT FRACTURE;  Surgeon: Nadara Mustard, MD;  Location: [facility] OR;  Service: Orthopedics;  Laterality: Right;   Social History   Social History Narrative  . Not on file   Immunization History  Administered Date(s) Administered  . Influenza, High Dose Seasonal PF 12/19/2017  . Moderna SARS-COVID-2 Vaccination 02/26/2019, 04/03/2019  . Tdap 12/19/2017  . Zoster Recombinat (Shingrix) 12/19/2017, 03/03/2018     Objective: Vital Signs: BP 107/68 (BP Location: Left Arm, Patient Position: Sitting, Cuff Size: Small)   Pulse 97   Resp 16   Ht 5' 2.5" (1.588 m)   Wt 203 lb 3.2 oz (92.2 kg)   BMI 36.57 kg/m    Physical Exam Vitals and nursing note reviewed.  Constitutional:      Appearance: She is well-developed.  HENT:     Head: Normocephalic and atraumatic.  Eyes:     Conjunctiva/sclera: Conjunctivae normal.  Pulmonary:     Effort: Pulmonary effort is normal.  Abdominal:     Palpations: Abdomen is soft.  Musculoskeletal:     Cervical back: Normal range of motion.  Skin:  General: Skin is warm and dry.     Capillary Refill: Capillary refill takes less than 2 seconds.  Neurological:     Mental Status: She is alert and oriented to person, place, and time.  Psychiatric:        Behavior: Behavior normal.      Musculoskeletal Exam: C-spine, thoracic spine, and lumbar spine good ROM.  No midline spinal tenderness. No SI joint tenderness.  Shoulder joints, elbow joints, wrist joints, MCPs, PIPs, and DIPs good ROM with no synovitis.  Mild PIP and DIP thickening consistent with osteoarthritis of both hands.  Complete fist formation bilaterally. Hip joints have good range of motion with no discomfort. Knee joints have good range of motion with no warmth or effusion. She has tenderness along the medial joint line of the right knee. Ankle joints have good range of motion with no tenderness or  inflammation.    CDAI Exam: CDAI Score: 0.8  Patient Global: 6 mm; Provider Global: 2 mm Swollen: 0 ; Tender: 0  Joint Exam 10/26/2019   No joint exam has been documented for this visit   There is currently no information documented on the homunculus. Go to the Rheumatology activity and complete the homunculus joint exam.  Investigation: No additional findings.  Imaging: No results found.  Recent Labs: Lab Results  Component Value Date   WBC 7.2 04/15/2019   HGB 9.9 (L) 04/15/2019   PLT 280 04/15/2019   NA 141 04/15/2019   K 4.6 04/15/2019   CL 110 04/15/2019   CO2 18 (L) 04/15/2019   GLUCOSE 150 (H) 04/15/2019   BUN 17 04/15/2019   CREATININE 1.01 (H) 04/15/2019   BILITOT 0.2 04/15/2019   ALKPHOS 59 06/07/2016   AST 10 04/15/2019   ALT 10 04/15/2019   PROT 6.4 04/15/2019   ALBUMIN 4.2 06/07/2016   CALCIUM 9.1 04/15/2019   GFRAA 67 04/15/2019    Speciality Comments: No specialty comments available.  Procedures:  No procedures performed Allergies: Fluoxetine hcl (pmdd), Hydroxychloroquine, and Vicodin [hydrocodone-acetaminophen]   Assessment / Plan:     Visit Diagnoses: Rheumatoid arthritis involving multiple sites with positive rheumatoid factor (HCC): She has no synovitis on exam. She has not had any recent rheumatoid arthritis flares. She is clinically doing well on sulfasalazine 500 mg 2 tablets in the morning and 1 tablet in the evening. She is tolerating sulfasalazine without any side effects. She has been experiencing some discomfort in the right knee joint for the past 3 weeks. She did not have any injury or falls. She has good range of motion with no warmth or effusion on exam. She declined updated x-rays today. She is not experiencing any other joint pain or joint swelling at this time. According to the patient several months ago she had an abscessed tooth and was on 2 rounds of antibiotics. She did not hold sulfasalazine during this time. She developed  severe diarrhea after completing antibiotics and was recently evaluated by gastroenterologist. She has started taking a probiotic and the diarrhea has resolved. She was also recently started on iron, vitamin D, and vitamin B12 supplements. We discussed the importance of holding sulfasalazine if she develops signs or symptoms of an infection and to resume once the infection has completely cleared. She will continue taking sulfasalazine 500 mg 2 tablets in the morning 1 tablet in the evening. She does not need a refill at this time. She was advised to notify us if she develops increased joint pain or joint swelling. She  will follow-up in the office in 5 months.  High risk medication use - Sulfasalazine 500 mg 2 tablets in the morning and 1 tablet in the evening. CBC and CMP were drawn on 10/01/2019. Creatinine was within normal limits, GFR was 56, LFTs within normal limits, hemoglobin 9.3 CRP 5.6 iron 11, and ferritin 4. She is due to update lab work in November and every 3 months to monitor for drug toxicity. She has an upcoming appointment with her PCP in 2 days. She has received both COVID-19 vaccinations and was encouraged to receive the third dose. She was advised to avoid Tylenol and NSAIDs 24 hours prior to the third dose. She was encouraged to continue to wear a mask and social distance. She was advised to discontinue sulfasalazine if she develops signs or symptoms of an infection and to resume once the infection has completely cleared. She was advised to notify us if she develops the COVID-19 infection in order to receive the antibody infusion. All questions were addressed and she voiced understanding.  Primary osteoarthritis of both hands: She has mild PIP and DIP thickening consistent with osteoarthritis of both hands. No tenderness or inflammation was noted. She is able to make a complete fist bilaterally. Joint protection and muscle strengthening were discussed.  Primary osteoarthritis of both  knees: She has good range of motion of both knee joints on exam. No warmth or effusion was noted. She has tenderness along the medial joint line of the right knee. She is been experiencing intermittent discomfort in the right knee for the past 3 weeks. She did not have any recent injuries or falls. Crepitus in both knees noted. She has had Visco gel injections in the past by another provider but the most recent injections did not provide any pain relief. She declined updated x-rays of her knee joints today. She was advised to notify us of her right knee joint pain persists or worsens.  Primary osteoarthritis of both feet: She is not experiencing any discomfort in her feet at this time. She wears proper fitting shoes.  DDD (degenerative disc disease), lumbar: She is not experiencing any discomfort in her lower back at this time. She has no symptoms of radiculopathy.  Spondylolisthesis at L4-L5 level: She is not experiencing any discomfort in her lower back at this time.  Osteopenia of multiple sites: DEXA ordered by PCP. She was recently started on vitamin D supplement for vitamin D deficiency.   Other medical conditions are listed as follows:   Attention deficit disorder, unspecified hyperactivity presence  History of bipolar disorder  Essential hypertension  History of asthma  History of diabetes mellitus  History of humerus fracture  History of IBS  History of gastroesophageal reflux (GERD)  Smoker  Orders: No orders of the defined types were placed in this encounter.  No orders of the defined types were placed in this encounter.     Follow-Up Instructions: Return in about 5 months (around 03/27/2020) for Rheumatoid arthritis, Osteoarthritis.   Gearldine Bienenstock, PA-C  Note - This record has been created using Dragon software.  Chart creation errors have been sought, but may not always  have been located. Such creation errors do not reflect on  the standard of medical  care.

## 2019-10-26 ENCOUNTER — Encounter: Payer: Self-pay | Admitting: Physician Assistant

## 2019-10-26 ENCOUNTER — Other Ambulatory Visit: Payer: Self-pay

## 2019-10-26 ENCOUNTER — Ambulatory Visit: Payer: Medicare PPO | Admitting: Physician Assistant

## 2019-10-26 VITALS — BP 107/68 | HR 97 | Resp 16 | Ht 62.5 in | Wt 203.2 lb

## 2019-10-26 DIAGNOSIS — Z79899 Other long term (current) drug therapy: Secondary | ICD-10-CM | POA: Diagnosis not present

## 2019-10-26 DIAGNOSIS — F988 Other specified behavioral and emotional disorders with onset usually occurring in childhood and adolescence: Secondary | ICD-10-CM | POA: Diagnosis not present

## 2019-10-26 DIAGNOSIS — M4316 Spondylolisthesis, lumbar region: Secondary | ICD-10-CM

## 2019-10-26 DIAGNOSIS — Z8659 Personal history of other mental and behavioral disorders: Secondary | ICD-10-CM

## 2019-10-26 DIAGNOSIS — I10 Essential (primary) hypertension: Secondary | ICD-10-CM

## 2019-10-26 DIAGNOSIS — M5136 Other intervertebral disc degeneration, lumbar region: Secondary | ICD-10-CM | POA: Diagnosis not present

## 2019-10-26 DIAGNOSIS — F17200 Nicotine dependence, unspecified, uncomplicated: Secondary | ICD-10-CM

## 2019-10-26 DIAGNOSIS — M170 Bilateral primary osteoarthritis of knee: Secondary | ICD-10-CM

## 2019-10-26 DIAGNOSIS — M19042 Primary osteoarthritis, left hand: Secondary | ICD-10-CM

## 2019-10-26 DIAGNOSIS — Z8709 Personal history of other diseases of the respiratory system: Secondary | ICD-10-CM

## 2019-10-26 DIAGNOSIS — Z8639 Personal history of other endocrine, nutritional and metabolic disease: Secondary | ICD-10-CM

## 2019-10-26 DIAGNOSIS — M8589 Other specified disorders of bone density and structure, multiple sites: Secondary | ICD-10-CM | POA: Diagnosis not present

## 2019-10-26 DIAGNOSIS — M19041 Primary osteoarthritis, right hand: Secondary | ICD-10-CM

## 2019-10-26 DIAGNOSIS — M0589 Other rheumatoid arthritis with rheumatoid factor of multiple sites: Secondary | ICD-10-CM | POA: Diagnosis not present

## 2019-10-26 DIAGNOSIS — M19071 Primary osteoarthritis, right ankle and foot: Secondary | ICD-10-CM | POA: Diagnosis not present

## 2019-10-26 DIAGNOSIS — M0579 Rheumatoid arthritis with rheumatoid factor of multiple sites without organ or systems involvement: Secondary | ICD-10-CM

## 2019-10-26 DIAGNOSIS — Z8781 Personal history of (healed) traumatic fracture: Secondary | ICD-10-CM

## 2019-10-26 DIAGNOSIS — M19072 Primary osteoarthritis, left ankle and foot: Secondary | ICD-10-CM

## 2019-10-26 DIAGNOSIS — Z8719 Personal history of other diseases of the digestive system: Secondary | ICD-10-CM

## 2019-10-26 NOTE — Patient Instructions (Addendum)
Standing Labs We placed an order today for your standing lab work.   Please have your standing labs drawn in November and every 3 months   If possible, please have your labs drawn 2 weeks prior to your appointment so that the provider can discuss your results at your appointment.  We have open lab daily Monday through Thursday from 8:30-12:30 PM and 1:30-4:30 PM and Friday from 8:30-12:30 PM and 1:30-4:00 PM at the office of Dr. Shaili Deveshwar, [facility].   Please be advised, patients with office appointments requiring lab work will take precedents over walk-in lab work.  If possible, please come for your lab work on Monday and Friday afternoons, as you may experience shorter wait times. The office is located at [address], [address], [place], [place] [address] No appointment is necessary.   Labs are drawn by [facility]. Please bring your co-pay at the time of your lab draw.  You may receive a bill from [facility] for your lab work.  If you wish to have your labs drawn at another location, please call the office 24 hours in advance to send orders.  If you have any questions regarding directions or hours of operation,  please call [phone].   As a reminder, please drink plenty of water prior to coming for your lab work. Thanks!   COVID-19 vaccine recommendations:   COVID-19 vaccine is recommended for everyone (unless you are allergic to a vaccine component), even if you are on a medication that suppresses your immune system.   If you are on Methotrexate, Cellcept (mycophenolate), Rinvoq, Xeljanz, and Olumiant- hold the medication for 1 week after each vaccine. Hold Methotrexate for 2 weeks after the single dose COVID-19 vaccine.   If you are on Orencia subcutaneous injection - hold medication one week prior to and one week after the first COVID-19 vaccine dose (only).   If you are on Orencia IV infusions- time vaccination administration so that the first COVID-19  vaccination will occur four weeks after the infusion and postpone the subsequent infusion by one week.   If you are on Cyclophosphamide or Rituxan infusions please contact your doctor prior to receiving the COVID-19 vaccine.   Do not take Tylenol or any anti-inflammatory medications (NSAIDs) 24 hours prior to the COVID-19 vaccination.   There is no direct evidence about the efficacy of the COVID-19 vaccine in individuals who are on medications that suppress the immune system.   Even if you are fully vaccinated, and you are on any medications that suppress your immune system, please continue to wear a mask, maintain at least six feet social distance and practice hand hygiene.   If you develop a COVID-19 infection, please contact your PCP or our office to determine if you need antibody infusion.  The booster vaccine is now available for immunocompromised patients. It is advised that if you had Pfizer vaccine you should get Pfizer booster.  If you had a Moderna vaccine then you should get a Moderna booster. Johnson and Johnson does not have a booster vaccine at this time.  Please see the following web sites for updated information.   [email or web address]://[email or web address]/[email or web address]-Vaccination-Patient-Resources.[email or web address]  [email or web address]://[email or web address]/About-Us/[email or web address]-Releases/ID/1159   

## 2019-10-28 DIAGNOSIS — Z6833 Body mass index (BMI) 33.0-33.9, adult: Secondary | ICD-10-CM | POA: Diagnosis not present

## 2019-10-28 DIAGNOSIS — Z299 Encounter for prophylactic measures, unspecified: Secondary | ICD-10-CM | POA: Diagnosis not present

## 2019-10-28 DIAGNOSIS — I10 Essential (primary) hypertension: Secondary | ICD-10-CM | POA: Diagnosis not present

## 2019-10-28 DIAGNOSIS — E1122 Type 2 diabetes mellitus with diabetic chronic kidney disease: Secondary | ICD-10-CM | POA: Diagnosis not present

## 2019-10-28 DIAGNOSIS — M1710 Unilateral primary osteoarthritis, unspecified knee: Secondary | ICD-10-CM | POA: Diagnosis not present

## 2019-10-28 DIAGNOSIS — E1165 Type 2 diabetes mellitus with hyperglycemia: Secondary | ICD-10-CM | POA: Diagnosis not present

## 2019-10-28 DIAGNOSIS — F17210 Nicotine dependence, cigarettes, uncomplicated: Secondary | ICD-10-CM | POA: Diagnosis not present

## 2019-11-03 ENCOUNTER — Ambulatory Visit: Payer: Medicare PPO | Admitting: Gastroenterology

## 2019-11-03 DIAGNOSIS — F17210 Nicotine dependence, cigarettes, uncomplicated: Secondary | ICD-10-CM | POA: Diagnosis not present

## 2019-11-03 DIAGNOSIS — M1710 Unilateral primary osteoarthritis, unspecified knee: Secondary | ICD-10-CM | POA: Diagnosis not present

## 2019-11-03 DIAGNOSIS — E1165 Type 2 diabetes mellitus with hyperglycemia: Secondary | ICD-10-CM | POA: Diagnosis not present

## 2019-11-03 DIAGNOSIS — Z299 Encounter for prophylactic measures, unspecified: Secondary | ICD-10-CM | POA: Diagnosis not present

## 2019-11-03 DIAGNOSIS — I10 Essential (primary) hypertension: Secondary | ICD-10-CM | POA: Diagnosis not present

## 2019-11-03 DIAGNOSIS — N183 Chronic kidney disease, stage 3 unspecified: Secondary | ICD-10-CM | POA: Diagnosis not present

## 2019-11-03 DIAGNOSIS — Z6833 Body mass index (BMI) 33.0-33.9, adult: Secondary | ICD-10-CM | POA: Diagnosis not present

## 2019-11-03 DIAGNOSIS — E1122 Type 2 diabetes mellitus with diabetic chronic kidney disease: Secondary | ICD-10-CM | POA: Diagnosis not present

## 2019-11-05 DIAGNOSIS — E1165 Type 2 diabetes mellitus with hyperglycemia: Secondary | ICD-10-CM | POA: Diagnosis not present

## 2019-11-10 DIAGNOSIS — I10 Essential (primary) hypertension: Secondary | ICD-10-CM | POA: Diagnosis not present

## 2019-11-10 DIAGNOSIS — Z299 Encounter for prophylactic measures, unspecified: Secondary | ICD-10-CM | POA: Diagnosis not present

## 2019-11-10 DIAGNOSIS — M1710 Unilateral primary osteoarthritis, unspecified knee: Secondary | ICD-10-CM | POA: Diagnosis not present

## 2019-11-10 DIAGNOSIS — F17210 Nicotine dependence, cigarettes, uncomplicated: Secondary | ICD-10-CM | POA: Diagnosis not present

## 2019-11-10 DIAGNOSIS — Z6833 Body mass index (BMI) 33.0-33.9, adult: Secondary | ICD-10-CM | POA: Diagnosis not present

## 2019-11-25 DIAGNOSIS — Z23 Encounter for immunization: Secondary | ICD-10-CM | POA: Diagnosis not present

## 2019-11-25 DIAGNOSIS — Z299 Encounter for prophylactic measures, unspecified: Secondary | ICD-10-CM | POA: Diagnosis not present

## 2019-11-25 DIAGNOSIS — E1165 Type 2 diabetes mellitus with hyperglycemia: Secondary | ICD-10-CM | POA: Diagnosis not present

## 2019-11-25 DIAGNOSIS — F17210 Nicotine dependence, cigarettes, uncomplicated: Secondary | ICD-10-CM | POA: Diagnosis not present

## 2019-11-25 DIAGNOSIS — I10 Essential (primary) hypertension: Secondary | ICD-10-CM | POA: Diagnosis not present

## 2019-11-25 DIAGNOSIS — Z6833 Body mass index (BMI) 33.0-33.9, adult: Secondary | ICD-10-CM | POA: Diagnosis not present

## 2019-11-25 DIAGNOSIS — E1122 Type 2 diabetes mellitus with diabetic chronic kidney disease: Secondary | ICD-10-CM | POA: Diagnosis not present

## 2019-11-27 ENCOUNTER — Telehealth: Payer: Self-pay

## 2019-11-27 ENCOUNTER — Other Ambulatory Visit: Payer: Self-pay | Admitting: Rheumatology

## 2019-11-27 DIAGNOSIS — M25561 Pain in right knee: Secondary | ICD-10-CM | POA: Diagnosis not present

## 2019-11-27 NOTE — Telephone Encounter (Signed)
Patient advised I did find that lab work and she is good on her labs until the end of November 2021. Patient expressed understanding.

## 2019-11-27 NOTE — Telephone Encounter (Signed)
Last Visit: 10/26/2019 Next Visit: 03/29/2020 Labs:  10/01/2019 Creatinine was within normal limits, GFR was 56, LFTs within normal limits, hemoglobin 9.3 CRP 5.6 iron 11, and ferritin 4  Current Dose per office note on 10/26/2019: ulfasalazine 500 mg 2 tablets in the morning and 1 tablet in the evening Dx: Rheumatoid arthritis involving multiple sites with positive rheumatoid factor   Okay to refill SSZ?

## 2019-11-27 NOTE — Telephone Encounter (Signed)
Patient called stating she was returning your call regarding her labwork being due to refill her prescription.  Patient states she had labwork on 10/01/19 at [facility] and gave a printout of the results to [place] at her appointment on 10/26/19.  Patient states Ladona Ridgel told her she wasn't due for labwork until November.

## 2019-12-01 ENCOUNTER — Other Ambulatory Visit: Payer: Self-pay | Admitting: Orthopedic Surgery

## 2019-12-01 DIAGNOSIS — M25561 Pain in right knee: Secondary | ICD-10-CM

## 2019-12-05 DIAGNOSIS — E1165 Type 2 diabetes mellitus with hyperglycemia: Secondary | ICD-10-CM | POA: Diagnosis not present

## 2019-12-07 DIAGNOSIS — Z299 Encounter for prophylactic measures, unspecified: Secondary | ICD-10-CM | POA: Diagnosis not present

## 2019-12-07 DIAGNOSIS — E1142 Type 2 diabetes mellitus with diabetic polyneuropathy: Secondary | ICD-10-CM | POA: Diagnosis not present

## 2019-12-07 DIAGNOSIS — F17210 Nicotine dependence, cigarettes, uncomplicated: Secondary | ICD-10-CM | POA: Diagnosis not present

## 2019-12-07 DIAGNOSIS — I10 Essential (primary) hypertension: Secondary | ICD-10-CM | POA: Diagnosis not present

## 2019-12-07 DIAGNOSIS — E1122 Type 2 diabetes mellitus with diabetic chronic kidney disease: Secondary | ICD-10-CM | POA: Diagnosis not present

## 2019-12-07 DIAGNOSIS — N183 Chronic kidney disease, stage 3 unspecified: Secondary | ICD-10-CM | POA: Diagnosis not present

## 2019-12-07 DIAGNOSIS — Z6833 Body mass index (BMI) 33.0-33.9, adult: Secondary | ICD-10-CM | POA: Diagnosis not present

## 2019-12-09 DIAGNOSIS — H40033 Anatomical narrow angle, bilateral: Secondary | ICD-10-CM | POA: Diagnosis not present

## 2019-12-09 DIAGNOSIS — E119 Type 2 diabetes mellitus without complications: Secondary | ICD-10-CM | POA: Diagnosis not present

## 2019-12-16 ENCOUNTER — Other Ambulatory Visit: Payer: Self-pay | Admitting: *Deleted

## 2019-12-16 ENCOUNTER — Telehealth: Payer: Self-pay | Admitting: Rheumatology

## 2019-12-16 DIAGNOSIS — Z79899 Other long term (current) drug therapy: Secondary | ICD-10-CM

## 2019-12-16 NOTE — Telephone Encounter (Signed)
Lab Orders released.  

## 2019-12-16 NOTE — Telephone Encounter (Signed)
Patient going to [facility] in [place]. Please release orders.

## 2019-12-20 ENCOUNTER — Ambulatory Visit
Admission: RE | Admit: 2019-12-20 | Discharge: 2019-12-20 | Disposition: A | Discharge status: Home / Self Care | Payer: Medicare PPO | Source: Ambulatory Visit | Attending: Orthopedic Surgery | Admitting: Orthopedic Surgery

## 2019-12-20 DIAGNOSIS — M25561 Pain in right knee: Secondary | ICD-10-CM

## 2019-12-25 DIAGNOSIS — M25561 Pain in right knee: Secondary | ICD-10-CM | POA: Diagnosis not present

## 2020-01-05 DIAGNOSIS — E1165 Type 2 diabetes mellitus with hyperglycemia: Secondary | ICD-10-CM | POA: Diagnosis not present

## 2020-01-06 ENCOUNTER — Other Ambulatory Visit: Payer: Self-pay | Admitting: Unknown Physician Specialty

## 2020-01-06 DIAGNOSIS — Z1231 Encounter for screening mammogram for malignant neoplasm of breast: Secondary | ICD-10-CM

## 2020-01-07 DIAGNOSIS — F3112 Bipolar disorder, current episode manic without psychotic features, moderate: Secondary | ICD-10-CM | POA: Diagnosis not present

## 2020-01-11 ENCOUNTER — Telehealth: Payer: Self-pay | Admitting: Rheumatology

## 2020-01-11 DIAGNOSIS — Z79899 Other long term (current) drug therapy: Secondary | ICD-10-CM | POA: Diagnosis not present

## 2020-01-11 NOTE — Telephone Encounter (Signed)
Patient going to [facility] for labs now. Please release orders.

## 2020-01-11 NOTE — Telephone Encounter (Signed)
Lab Orders released.  

## 2020-01-12 LAB — CBC WITH DIFFERENTIAL/PLATELET
Basophils Absolute: 0 10*3/uL (ref 0.0–0.2)
Basos: 1 %
EOS (ABSOLUTE): 0.1 10*3/uL (ref 0.0–0.4)
Eos: 1 %
Hematocrit: 34.7 % (ref 34.0–46.6)
Hemoglobin: 10.4 g/dL — ABNORMAL LOW (ref 11.1–15.9)
Immature Grans (Abs): 0 10*3/uL (ref 0.0–0.1)
Immature Granulocytes: 1 %
Lymphocytes Absolute: 2.4 10*3/uL (ref 0.7–3.1)
Lymphs: 30 %
MCH: 24.8 pg — ABNORMAL LOW (ref 26.6–33.0)
MCHC: 30 g/dL — ABNORMAL LOW (ref 31.5–35.7)
MCV: 83 fL (ref 79–97)
Monocytes Absolute: 0.6 10*3/uL (ref 0.1–0.9)
Monocytes: 7 %
Neutrophils Absolute: 4.9 10*3/uL (ref 1.4–7.0)
Neutrophils: 60 %
Platelets: 267 10*3/uL (ref 150–450)
RBC: 4.2 x10E6/uL (ref 3.77–5.28)
RDW: 17.8 % — ABNORMAL HIGH (ref 11.7–15.4)
WBC: 8.1 10*3/uL (ref 3.4–10.8)

## 2020-01-12 LAB — CMP14+EGFR
ALT: 11 IU/L (ref 0–32)
AST: 14 IU/L (ref 0–40)
Albumin/Globulin Ratio: 2 (ref 1.2–2.2)
Albumin: 4.3 g/dL (ref 3.8–4.8)
Alkaline Phosphatase: 87 IU/L (ref 44–121)
BUN/Creatinine Ratio: 17 (ref 12–28)
BUN: 20 mg/dL (ref 8–27)
Bilirubin Total: <0.2 mg/dL (ref 0.0–1.2)
CO2: 20 mmol/L (ref 20–29)
Calcium: 9.1 mg/dL (ref 8.7–10.3)
Chloride: 108 mmol/L — ABNORMAL HIGH (ref 96–106)
Creatinine, Ser: 1.15 mg/dL — ABNORMAL HIGH (ref 0.57–1.00)
GFR calc Af Amer: 57 mL/min/{1.73_m2} — ABNORMAL LOW (ref >59)
GFR calc non Af Amer: 49 mL/min/{1.73_m2} — ABNORMAL LOW (ref >59)
Globulin, Total: 2.2 g/dL (ref 1.5–4.5)
Glucose: 127 mg/dL — ABNORMAL HIGH (ref 65–99)
Potassium: 4.9 mmol/L (ref 3.5–5.2)
Sodium: 140 mmol/L (ref 134–144)
Total Protein: 6.5 g/dL (ref 6.0–8.5)

## 2020-01-12 NOTE — Telephone Encounter (Signed)
Labs are stable.  GFR is low most likely due to diabetes and use of ACE inhibitor.  Hemogobin is low but stable

## 2020-01-13 DIAGNOSIS — Z01419 Encounter for gynecological examination (general) (routine) without abnormal findings: Secondary | ICD-10-CM | POA: Diagnosis not present

## 2020-01-19 ENCOUNTER — Ambulatory Visit
Admission: RE | Admit: 2020-01-19 | Discharge: 2020-01-19 | Disposition: A | Discharge status: Home / Self Care | Payer: Medicare PPO | Source: Ambulatory Visit

## 2020-01-19 ENCOUNTER — Other Ambulatory Visit: Payer: Self-pay

## 2020-01-19 DIAGNOSIS — Z1231 Encounter for screening mammogram for malignant neoplasm of breast: Secondary | ICD-10-CM

## 2020-02-04 DIAGNOSIS — E1165 Type 2 diabetes mellitus with hyperglycemia: Secondary | ICD-10-CM | POA: Diagnosis not present

## 2020-02-11 ENCOUNTER — Other Ambulatory Visit: Payer: Self-pay | Admitting: Rheumatology

## 2020-02-11 NOTE — Telephone Encounter (Signed)
Last Visit: 10/26/2019 Next Visit: 03/29/2020 Labs: 01/11/2020, Labs are stable. GFR is low most likely due to diabetes and use of ACE inhibitor. Hemogobin is low but stable  Current Dose per office note 10/26/2019, Sulfasalazine 500 mg 2 tablets in the morning and 1 tablet in the evening. DX: Rheumatoid arthritis involving multiple sites with positive rheumatoid factor   Okay to refill Sulfasalazine?

## 2020-02-17 DIAGNOSIS — N183 Chronic kidney disease, stage 3 unspecified: Secondary | ICD-10-CM | POA: Diagnosis not present

## 2020-02-17 DIAGNOSIS — Z299 Encounter for prophylactic measures, unspecified: Secondary | ICD-10-CM | POA: Diagnosis not present

## 2020-02-17 DIAGNOSIS — Z6834 Body mass index (BMI) 34.0-34.9, adult: Secondary | ICD-10-CM | POA: Diagnosis not present

## 2020-02-17 DIAGNOSIS — E1122 Type 2 diabetes mellitus with diabetic chronic kidney disease: Secondary | ICD-10-CM | POA: Diagnosis not present

## 2020-02-17 DIAGNOSIS — F17210 Nicotine dependence, cigarettes, uncomplicated: Secondary | ICD-10-CM | POA: Diagnosis not present

## 2020-02-17 DIAGNOSIS — M1909 Primary osteoarthritis, other specified site: Secondary | ICD-10-CM | POA: Diagnosis not present

## 2020-02-17 DIAGNOSIS — E1165 Type 2 diabetes mellitus with hyperglycemia: Secondary | ICD-10-CM | POA: Diagnosis not present

## 2020-03-07 DIAGNOSIS — E1165 Type 2 diabetes mellitus with hyperglycemia: Secondary | ICD-10-CM | POA: Diagnosis not present

## 2020-03-15 NOTE — Progress Notes (Signed)
Office Visit Note  Patient: Margaret [name]             Date of Birth: 1950/09/22           MRN: [id number]             PCP: Kirstie Peri, MD Referring: Kirstie Peri, MD Visit Date: 03/29/2020 Occupation: @  Subjective:  Trochanteric bursitis of both hips   History of Present Illness: Margaret [name] is a 70 y.o. female with history of seropositive rheumatoid arthritis, osteoarthritis, and DDD.  She is currently on sulfasalazine 500 mg 2 tablets in the morning and 1 tablet in the evening.  She denies missing any doses recently.  She denies any recent infections.  She denies any recent rheumatoid arthritis flares she states in November 2021 she was experiencing severe pain in her right knee joint.  She did not have any injury prior to the onset of symptoms.  She was evaluated by Dr. Madelon Lips who proceeded with an MRI of the right knee.  She had a cortisone injection in the right knee which resolved her discomfort.  She denies any joint swelling at this time.  She has ongoing discomfort due to trochanteric bursitis of both hips.  She continues to have chronic lower back pain but no symptoms of radiculopathy.   Activities of Daily Living:  Patient reports morning stiffness for 5 minutes.   Patient Reports nocturnal pain.  Difficulty dressing/grooming: Denies Difficulty climbing stairs: Denies Difficulty getting out of chair: Denies Difficulty using hands for taps, buttons, cutlery, and/or writing: Denies  Review of Systems  Constitutional: Positive for fatigue.  HENT: Positive for mouth sores, mouth dryness and nose dryness.   Eyes: Positive for dryness. Negative for pain and itching.  Respiratory: Negative for shortness of breath and difficulty breathing.   Cardiovascular: Negative for chest pain and palpitations.  Gastrointestinal: Positive for constipation. Negative for blood in stool and diarrhea.  Endocrine: Negative for increased urination.  Genitourinary: Negative for  difficulty urinating.  Musculoskeletal: Positive for arthralgias, joint pain, myalgias, morning stiffness, muscle tenderness and myalgias. Negative for joint swelling.  Skin: Negative for color change, rash and redness.  Allergic/Immunologic: Negative for susceptible to infections.  Neurological: Negative for dizziness, numbness, headaches and memory loss.  Hematological: Negative for bruising/bleeding tendency.  Psychiatric/Behavioral: Negative for confusion.    PMFS History:  Patient Active Problem List   Diagnosis Date Noted  . Idiopathic chronic venous hypertension of both lower extremities with inflammation 06/18/2017  . Closed displaced oblique fracture of shaft of right humerus 03/27/2017  . Smoker 01/09/2016  . High risk medication use 01/06/2016  . RA (rheumatoid arthritis) (HCC) 01/05/2016  . DDD (degenerative disc disease), lumbar 01/05/2016  . Calcaneal spur 01/05/2016  . Bipolar disorder (HCC) 01/05/2016  . ADD (attention deficit disorder) 01/05/2016  . Sleep apnea 01/05/2016  . TMJ (dislocation of temporomandibular joint) 01/05/2016  . Pedal edema 01/05/2016  . Osteoarthritis of both hands 01/05/2016  . Osteoarthritis of both feet 01/05/2016  . Osteoarthritis of both knees 01/05/2016  . GERD (gastroesophageal reflux disease) 06/24/2015  . Constipation 06/24/2015  . Spondylolisthesis at L4-L5 level 01/24/2015  . 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 CONTRACTURE OF SHOULDER JOINT 04/24/2007  . DIABETES 07/30/2006    Past Medical History:  Diagnosis Date  . ADD (attention deficit disorder) 01/05/2016  . Arthritis   . Asthma   . Benign essential hypertension   . Bipolar disorder [facility])    hospitalized at [facility] for manic  episodes  . Calcaneal spur 01/05/2016  . DDD (degenerative disc disease), lumbar 01/05/2016  . Diabetes mellitus without complication (HCC)   . Diabetic neuropathy (HCC)   . Dysrhythmia    takes diltiazem prescribed by medical doctor; has never seen  cardiologist  . Eczema   . Gastroesophageal reflux disease   . Humeral shaft fracture    right  . Hyperlipidemia   . Hypersomnia   . IBS (irritable bowel syndrome)   . Myalgia   . Noncompliance with CPAP treatment   . Osteoarthritis of both feet 01/05/2016  . Osteoarthritis of both hands 01/05/2016  . Osteoarthritis of both knees 01/05/2016  . Pedal edema 01/05/2016  . Peripheral vascular disease (HCC)   . RA (rheumatoid arthritis) (HCC) 01/05/2016   +RF, +CCP, Nodulous,   . Rheumatoid arthritis (HCC)   . Sleep apnea 2015   home sleep study   . TMJ (dislocation of temporomandibular joint) 01/05/2016  . Varicose veins     Family History  Problem Relation Age of Onset  . Hypertension Father   . Alcoholism Mother   . Stroke Mother   . AAA (abdominal aortic aneurysm) Brother   . Gallstones Sister   . Osteoporosis Sister   . Alcoholism Brother   . Diabetes Brother   . Healthy Son   . Healthy Daughter   . Colon cancer Neg Hx   . Colon polyps Neg Hx    Past Surgical History:  Procedure Laterality Date  . BACK SURGERY    . CESAREAN SECTION    . COLONOSCOPY  2011   Dr. Loreta Ave in [place]  . ELBOW SURGERY    . ENDOVENOUS ABLATION SAPHENOUS VEIN W/ LASER    . ORIF HUMERUS FRACTURE Right 03/29/2017   Procedure: OPEN REDUCTION INTERNAL FIXATION (ORIF) RIGHT HUMERAL SHAFT FRACTURE;  Surgeon: Nadara Mustard, MD;  Location: [facility] OR;  Service: Orthopedics;  Laterality: Right;   Social History   Social History Narrative  . Not on file   Immunization History  Administered Date(s) Administered  . Influenza, High Dose Seasonal PF 12/19/2017  . Moderna Sars-Covid-2 Vaccination 02/26/2019, 04/03/2019  . Tdap 12/19/2017  . Zoster Recombinat (Shingrix) 12/19/2017, 03/03/2018     Objective: Vital Signs: BP 119/72 (BP Location: Left Arm, Patient Position: Sitting, Cuff Size: Normal)   Pulse 88   Resp 16   Ht 5' 2.5" (1.588 m)   Wt 215 lb 12.8 oz (97.9 kg)   BMI 38.84 kg/m     Physical Exam Vitals and nursing note reviewed.  Constitutional:      Appearance: She is well-developed and well-nourished.  HENT:     Head: Normocephalic and atraumatic.  Eyes:     Extraocular Movements: EOM normal.     Conjunctiva/sclera: Conjunctivae normal.  Cardiovascular:     Pulses: Intact distal pulses.  Pulmonary:     Effort: Pulmonary effort is normal.  Abdominal:     Palpations: Abdomen is soft.  Musculoskeletal:     Cervical back: Normal range of motion.  Skin:    General: Skin is warm and dry.     Capillary Refill: Capillary refill takes less than 2 seconds.  Neurological:     Mental Status: She is alert and oriented to person, place, and time.  Psychiatric:        Mood and Affect: Mood and affect normal.        Behavior: Behavior normal.      Musculoskeletal Exam: C-spine, thoracic spine, and lumbar spine good ROM.  No midline spinal tenderness.  No SI joint tenderness.  Shoulder joints, elbow joints, wrist joints, MCPs, PIPs, and DIPs good ROM with no synovitis.  Complete fist formation bilaterally.  CMC joint tenderness bilaterally.  Hip joints good ROM with no discomfort.  Tenderness over bilateral trochanteric bursa.  Good ROM of both knee joints with mild crepitus but no warmth or effusion.  Ankle joints good ROM with no tenderness or discomfort.  Pedal edema noted bilaterally.   CDAI Exam: CDAI Score: 1  Patient Global: 7 mm; Provider Global: 3 mm Swollen: 0 ; Tender: 0  Joint Exam 03/29/2020   No joint exam has been documented for this visit   There is currently no information documented on the homunculus. Go to the Rheumatology activity and complete the homunculus joint exam.  Investigation: No additional findings.  Imaging: No results found.  Recent Labs: Lab Results  Component Value Date   WBC 8.1 01/11/2020   HGB 10.4 (L) 01/11/2020   PLT 267 01/11/2020   NA 140 01/11/2020   K 4.9 01/11/2020   CL 108 (H) 01/11/2020   CO2 20  01/11/2020   GLUCOSE 127 (H) 01/11/2020   BUN 20 01/11/2020   CREATININE 1.15 (H) 01/11/2020   BILITOT <0.2 01/11/2020   ALKPHOS 87 01/11/2020   AST 14 01/11/2020   ALT 11 01/11/2020   PROT 6.5 01/11/2020   ALBUMIN 4.3 01/11/2020   CALCIUM 9.1 01/11/2020   GFRAA 57 (L) 01/11/2020    Speciality Comments: No specialty comments available.  Procedures:  No procedures performed Allergies: Fluoxetine hcl (pmdd), Hydroxychloroquine, and Vicodin [hydrocodone-acetaminophen]   Assessment / Plan:     Visit Diagnoses: Rheumatoid arthritis involving multiple sites with positive rheumatoid factor (HCC) -She has no joint tenderness or synovitis on exam.  She has not had any recent rheumatoid arthritis flares.  She is clinically been doing well taking sulfasalazine 500 mg 2 tablets in the morning and 1 tablet in the evening.  She has not missed any doses of sulfasalazine and has been tolerating it without any side effects.  In November 2021 she experienced increased pain in her right knee joint and was having difficulty ambulating during that time.  She did not have any injury prior to the onset of symptoms.  She was evaluated by Dr. Madelon Lips and proceeded with an MRI of the right knee.  MRI results from 12/20/2019 revealed no acute findings or evidence of internal derangement.  She had a right knee joint cortisone injection performed which resolved her right knee joint pain.  On examination today, she has good range of motion of the right knee with no warmth or effusion.  We discussed updating x-rays of both hands and feet today to assess for radiographic progression.  She will continue taking sulfasalazine as prescribed.  She was was to notify us if she develops increased joint pain or joint swelling.  She will follow-up in the office in 5 months.: XR Hand 2 View Left, XR Hand 2 View Right, XR Foot 2 Views Left, XR Foot 2 Views Right  High risk medication use - Sulfasalazine 500 mg 2 tablets in the morning  and 1 tablet in the evening.  CBC and CMP were drawn on 01/11/2020.  Orders for CBC and CMP were released today.  Her next lab work will be due in May and every 3 months to monitor for drug toxicity.  Standing orders for CBC and CMP remain in place.- Plan: CBC with Differential/Platelet, COMPLETE METABOLIC PANEL WITH  GFR She received 2 moderna covid-19 vaccine doses.   Primary osteoarthritis of both hands: She has PIP and DIP thickening consistent with osteoarthritis of both hands. She has CMC joint tenderness bilaterally.  Discussed the importance of joint protection and muscle strengthening.   Primary osteoarthritis of both knees: She has good range of motion in both knee joints with no discomfort.  No warmth or effusion was noted.  She has mild crepitus in both knees.  According to the patient in November 2021 she experienced severe pain in her right knee joint.  She did not have any injury prior to the onset of symptoms.  She was evaluated by Dr. Madelon Lips and proceeded with an MRI on 12/20/2019.  The MRI was unremarkable overall.  She underwent a cortisone injection which resolved her discomfort.  Primary osteoarthritis of both feet: She is not experiencing any discomfort in her feet at this time.  She has good range of motion of both ankle joints with no tenderness or inflammation.  Trochanteric bursitis of both hips: She has tenderness palpation over bilateral trochanteric bursa.  She was given a handout of exercises to perform.  She was advised to notify us if her discomfort persists or worsens.  DDD (degenerative disc disease), lumbar: Chronic pain. No symptoms of radiculopathy.  No midline spinal tenderness.   Spondylolisthesis at L4-L5 level: Chronic pain   Osteopenia of multiple sites - DEXA ordered by PCP. DEXA not available in Epic to review.    Other medical conditions are listed as follows:   History of bipolar disorder  Attention deficit disorder, unspecified hyperactivity  presence  Essential hypertension  History of humerus fracture  History of asthma  History of diabetes mellitus  History of IBS  History of gastroesophageal reflux (GERD)  Smoker  Orders: Orders Placed This Encounter  Procedures  . XR Hand 2 View Left  . XR Hand 2 View Right  . XR Foot 2 Views Left  . XR Foot 2 Views Right  . CBC with Differential/Platelet  . COMPLETE METABOLIC PANEL WITH GFR   No orders of the defined types were placed in this encounter.     Follow-Up Instructions: Return in about 5 months (around 08/26/2020) for Rheumatoid arthritis, Osteoarthritis, DDD.   Gearldine Bienenstock, PA-C  Note - This record has been created using Dragon software.  Chart creation errors have been sought, but may not always  have been located. Such creation errors do not reflect on  the standard of medical care.

## 2020-03-29 ENCOUNTER — Ambulatory Visit: Payer: Self-pay

## 2020-03-29 ENCOUNTER — Ambulatory Visit: Payer: Medicare PPO | Admitting: Physician Assistant

## 2020-03-29 ENCOUNTER — Encounter: Payer: Self-pay | Admitting: Physician Assistant

## 2020-03-29 ENCOUNTER — Other Ambulatory Visit: Payer: Self-pay

## 2020-03-29 VITALS — BP 119/72 | HR 88 | Resp 16 | Ht 62.5 in | Wt 215.8 lb

## 2020-03-29 DIAGNOSIS — M4316 Spondylolisthesis, lumbar region: Secondary | ICD-10-CM

## 2020-03-29 DIAGNOSIS — M170 Bilateral primary osteoarthritis of knee: Secondary | ICD-10-CM | POA: Diagnosis not present

## 2020-03-29 DIAGNOSIS — M19071 Primary osteoarthritis, right ankle and foot: Secondary | ICD-10-CM

## 2020-03-29 DIAGNOSIS — Z79899 Other long term (current) drug therapy: Secondary | ICD-10-CM | POA: Diagnosis not present

## 2020-03-29 DIAGNOSIS — M5136 Other intervertebral disc degeneration, lumbar region: Secondary | ICD-10-CM | POA: Diagnosis not present

## 2020-03-29 DIAGNOSIS — M19072 Primary osteoarthritis, left ankle and foot: Secondary | ICD-10-CM | POA: Diagnosis not present

## 2020-03-29 DIAGNOSIS — M8589 Other specified disorders of bone density and structure, multiple sites: Secondary | ICD-10-CM | POA: Diagnosis not present

## 2020-03-29 DIAGNOSIS — Z8639 Personal history of other endocrine, nutritional and metabolic disease: Secondary | ICD-10-CM

## 2020-03-29 DIAGNOSIS — M0579 Rheumatoid arthritis with rheumatoid factor of multiple sites without organ or systems involvement: Secondary | ICD-10-CM | POA: Diagnosis not present

## 2020-03-29 DIAGNOSIS — M19041 Primary osteoarthritis, right hand: Secondary | ICD-10-CM | POA: Diagnosis not present

## 2020-03-29 DIAGNOSIS — Z8659 Personal history of other mental and behavioral disorders: Secondary | ICD-10-CM

## 2020-03-29 DIAGNOSIS — I10 Essential (primary) hypertension: Secondary | ICD-10-CM

## 2020-03-29 DIAGNOSIS — M19042 Primary osteoarthritis, left hand: Secondary | ICD-10-CM

## 2020-03-29 DIAGNOSIS — M7061 Trochanteric bursitis, right hip: Secondary | ICD-10-CM | POA: Diagnosis not present

## 2020-03-29 DIAGNOSIS — M7062 Trochanteric bursitis, left hip: Secondary | ICD-10-CM

## 2020-03-29 DIAGNOSIS — Z8709 Personal history of other diseases of the respiratory system: Secondary | ICD-10-CM

## 2020-03-29 DIAGNOSIS — F988 Other specified behavioral and emotional disorders with onset usually occurring in childhood and adolescence: Secondary | ICD-10-CM

## 2020-03-29 DIAGNOSIS — Z8719 Personal history of other diseases of the digestive system: Secondary | ICD-10-CM

## 2020-03-29 DIAGNOSIS — F17200 Nicotine dependence, unspecified, uncomplicated: Secondary | ICD-10-CM

## 2020-03-29 DIAGNOSIS — Z8781 Personal history of (healed) traumatic fracture: Secondary | ICD-10-CM

## 2020-03-29 NOTE — Patient Instructions (Addendum)
Standing Labs We placed an order today for your standing lab work.   Please have your standing labs drawn in May and every 3 months    If possible, please have your labs drawn 2 weeks prior to your appointment so that the provider can discuss your results at your appointment.  We have open lab daily Monday through Thursday from 1:30-4:30 PM and Friday from 1:30-4:00 PM at the office of Dr. Pollyann Savoy, [facility].   Please be advised, all patients with office appointments requiring lab work will take precedents over walk-in lab work.  If possible, please come for your lab work on Monday and Friday afternoons, as you may experience shorter wait times. The office is located at [address], [place], [place] [address] No appointment is necessary.   Labs are drawn by [facility]. Please bring your co-pay at the time of your lab draw.  You may receive a bill from [facility] for your lab work.  If you wish to have your labs drawn at another location, please call the office 24 hours in advance to send orders.  If you have any questions regarding directions or hours of operation,  please call [phone].   As a reminder, please drink plenty of water prior to coming for your lab work. Thanks!   Hip Bursitis Rehab Ask your health care provider which exercises are safe for you. Do exercises exactly as told by your health care provider and adjust them as directed. It is normal to feel mild stretching, pulling, tightness, or discomfort as you do these exercises. Stop right away if you feel sudden pain or your pain gets worse. Do not begin these exercises until told by your health care provider. Stretching exercise This exercise warms up your muscles and joints and improves the movement and flexibility of your hip. This exercise also helps to relieve pain and stiffness. Iliotibial band stretch An iliotibial band is a strong band of muscle tissue that runs from the outer side of  your hip to the outer side of your thigh and knee. 1. Lie on your side with your left / right leg in the top position. 2. Bend your left / right knee and grab your ankle. Stretch out your bottom arm to help you balance. 3. Slowly bring your knee back so your thigh is behind your body. 4. Slowly lower your knee toward the floor until you feel a gentle stretch on the outside of your left / right thigh. If you do not feel a stretch and your knee will not fall farther, place the heel of your other foot on top of your knee and pull your knee down toward the floor with your foot. 5. Hold this position for __________ seconds. 6. Slowly return to the starting position. Repeat __________ times. Complete this exercise __________ times a day.   Strengthening exercises These exercises build strength and endurance in your hip and pelvis. Endurance is the ability to use your muscles for a long time, even after they get tired. Bridge This exercise strengthens the muscles that move your thigh backward (hip extensors). 1. Lie on your back on a firm surface with your knees bent and your feet flat on the floor. 2. Tighten your buttocks muscles and lift your buttocks off the floor until your trunk is level with your thighs. ? Do not arch your back. ? You should feel the muscles working in your buttocks and the back of your thighs. If you do not  feel these muscles, slide your feet 1-2 inches (2.5-5 cm) farther away from your buttocks. ? If this exercise is too easy, try doing it with your arms crossed over your chest. 3. Hold this position for __________ seconds. 4. Slowly lower your hips to the starting position. 5. Let your muscles relax completely after each repetition. Repeat __________ times. Complete this exercise __________ times a day.   Squats This exercise strengthens the muscles in front of your thigh and knee (quadriceps). 1. Stand in front of a table, with your feet and knees pointing straight ahead.  You may rest your hands on the table for balance but not for support. 2. Slowly bend your knees and lower your hips like you are going to sit in a chair. ? Keep your weight over your heels, not over your toes. ? Keep your lower legs upright so they are parallel with the table legs. ? Do not let your hips go lower than your knees. ? Do not bend lower than told by your health care provider. ? If your hip pain increases, do not bend as low. 3. Hold the squat position for __________ seconds. 4. Slowly push with your legs to return to standing. Do not use your hands to pull yourself to standing. Repeat __________ times. Complete this exercise __________ times a day. Hip hike 1. Stand sideways on a bottom step. Stand on your left / right leg with your other foot unsupported next to the step. You can hold on to the railing or wall for balance if needed. 2. Keep your knees straight and your torso square. Then lift your left / right hip up toward the ceiling. 3. Hold this position for __________ seconds. 4. Slowly let your left / right hip lower toward the floor, past the starting position. Your foot should get closer to the floor. Do not lean or bend your knees. Repeat __________ times. Complete this exercise __________ times a day. Single leg stand 1. Without shoes, stand near a railing or in a doorway. You may hold on to the railing or door frame as needed for balance. 2. Squeeze your left / right buttock muscles, then lift up your other foot. ? Do not let your left / right hip push out to the side. ? It is helpful to stand in front of a mirror for this exercise so you can watch your hip. 3. Hold this position for __________ seconds. Repeat __________ times. Complete this exercise __________ times a day. This information is not intended to replace advice given to you by your health care provider. Make sure you discuss any questions you have with your health care provider. Document Revised:  05/19/2018 Document Reviewed: 05/19/2018 Elsevier Patient Education  2021 [employer]   Iliotibial Band Syndrome Rehab Ask your health care provider which exercises are safe for you. Do exercises exactly as told by your health care provider and adjust them as directed. It is normal to feel mild stretching, pulling, tightness, or discomfort as you do these exercises. Stop right away if you feel sudden pain or your pain gets significantly worse. Do not begin these exercises until told by your health care provider. Stretching and range-of-motion exercises These exercises warm up your muscles and joints and improve the movement and flexibility of your hip and pelvis. Quadriceps stretch, prone 1. Lie on your abdomen (prone position) on a firm surface, such as a bed or padded floor. 2. Bend your left / right knee and reach back to hold  your ankle or pant leg. If you cannot reach your ankle or pant leg, loop a belt around your foot and grab the belt instead. 3. Gently pull your heel toward your buttocks. Your knee should not slide out to the side. You should feel a stretch in the front of your thigh and knee (quadriceps). 4. Hold this position for __________ seconds. Repeat __________ times. Complete this exercise __________ times a day.   Iliotibial band stretch An iliotibial band is a strong band of muscle tissue that runs from the outer side of your hip to the outer side of your thigh and knee. 1. Lie on your side with your left / right leg in the top position. 2. Bend both of your knees and grab your left / right ankle. Stretch out your bottom arm to help you balance. 3. Slowly bring your top knee back so your thigh goes behind your trunk. 4. Slowly lower your top leg toward the floor until you feel a gentle stretch on the outside of your left / right hip and thigh. If you do not feel a stretch and your knee will not fall farther, place the heel of your other foot on top of your knee and pull your  knee down toward the floor with your foot. 5. Hold this position for __________ seconds. Repeat __________ times. Complete this exercise __________ times a day.   Strengthening exercises These exercises build strength and endurance in your hip and pelvis. Endurance is the ability to use your muscles for a long time, even after they get tired. Straight leg raises, side-lying This exercise strengthens the muscles that rotate the leg at the hip and move it away from your body (hip abductors). 1. Lie on your side with your left / right leg in the top position. Lie so your head, shoulder, hip, and knee line up. You may bend your bottom knee to help you balance. 2. Roll your hips slightly forward so your hips are stacked directly over each other and your left / right knee is facing forward. 3. Tense the muscles in your outer thigh and lift your top leg 4-6 inches (10-15 cm). 4. Hold this position for __________ seconds. 5. Slowly lower your leg to return to the starting position. Let your muscles relax completely before doing another repetition. Repeat __________ times. Complete this exercise __________ times a day.   Leg raises, prone This exercise strengthens the muscles that move the hips backward (hip extensors). 1. Lie on your abdomen (prone position) on your bed or a firm surface. You can put a pillow under your hips if that is more comfortable for your lower back. 2. Bend your left / right knee so your foot is straight up in the air. 3. Squeeze your buttocks muscles and lift your left / right thigh off the bed. Do not let your back arch. 4. Tense your thigh muscle as hard as you can without increasing any knee pain. 5. Hold this position for __________ seconds. 6. Slowly lower your leg to return to the starting position and allow it to relax completely. Repeat __________ times. Complete this exercise __________ times a day. Hip hike 1. Stand sideways on a bottom step. Stand on your left /  right leg with your other foot unsupported next to the step. You can hold on to a railing or wall for balance if needed. 2. Keep your knees straight and your torso square. Then lift your left / right hip up toward the  ceiling. 3. Slowly let your left / right hip lower toward the floor, past the starting position. Your foot should get closer to the floor. Do not lean or bend your knees. Repeat __________ times. Complete this exercise __________ times a day. This information is not intended to replace advice given to you by your health care provider. Make sure you discuss any questions you have with your health care provider. Document Revised: 04/01/2019 Document Reviewed: 04/01/2019 Elsevier Patient Education  2021 [employer].

## 2020-03-30 ENCOUNTER — Telehealth: Payer: Self-pay

## 2020-03-30 LAB — COMPLETE METABOLIC PANEL WITH GFR
AG Ratio: 1.9 (calc) (ref 1.0–2.5)
ALT: 16 U/L (ref 6–29)
AST: 16 U/L (ref 10–35)
Albumin: 4.3 g/dL (ref 3.6–5.1)
Alkaline phosphatase (APISO): 70 U/L (ref 37–153)
BUN/Creatinine Ratio: 14 (calc) (ref 6–22)
BUN: 17 mg/dL (ref 7–25)
CO2: 20 mmol/L (ref 20–32)
Calcium: 9.2 mg/dL (ref 8.6–10.4)
Chloride: 109 mmol/L (ref 98–110)
Creat: 1.2 mg/dL — ABNORMAL HIGH (ref 0.50–0.99)
GFR, Est African American: 54 mL/min/{1.73_m2} — ABNORMAL LOW (ref >=60)
GFR, Est Non African American: 47 mL/min/{1.73_m2} — ABNORMAL LOW (ref >=60)
Globulin: 2.3 g/dL (calc) (ref 1.9–3.7)
Glucose, Bld: 76 mg/dL (ref 65–99)
Potassium: 4.3 mmol/L (ref 3.5–5.3)
Sodium: 139 mmol/L (ref 135–146)
Total Bilirubin: 0.3 mg/dL (ref 0.2–1.2)
Total Protein: 6.6 g/dL (ref 6.1–8.1)

## 2020-03-30 LAB — CBC WITH DIFFERENTIAL/PLATELET
Absolute Monocytes: 656 cells/uL (ref 200–950)
Basophils Absolute: 67 cells/uL (ref 0–200)
Basophils Relative: 0.7 %
Eosinophils Absolute: 86 cells/uL (ref 15–500)
Eosinophils Relative: 0.9 %
HCT: 34.5 % — ABNORMAL LOW (ref 35.0–45.0)
Hemoglobin: 10.8 g/dL — ABNORMAL LOW (ref 11.7–15.5)
Lymphs Abs: 1995 cells/uL (ref 850–3900)
MCH: 26.5 pg — ABNORMAL LOW (ref 27.0–33.0)
MCHC: 31.3 g/dL — ABNORMAL LOW (ref 32.0–36.0)
MCV: 84.6 fL (ref 80.0–100.0)
MPV: 9.1 fL (ref 7.5–12.5)
Monocytes Relative: 6.9 %
Neutro Abs: 6698 cells/uL (ref 1500–7800)
Neutrophils Relative %: 70.5 %
Platelets: 269 10*3/uL (ref 140–400)
RBC: 4.08 10*6/uL (ref 3.80–5.10)
RDW: 17.6 % — ABNORMAL HIGH (ref 11.0–15.0)
Total Lymphocyte: 21 %
WBC: 9.5 10*3/uL (ref 3.8–10.8)

## 2020-03-30 NOTE — Telephone Encounter (Signed)
Patient left a voicemail stating she was returning Margaret [name]'s call.  Please call back on her cell #[phone]

## 2020-03-30 NOTE — Progress Notes (Signed)
I attempted to call the patient to discuss X-ray results.   X-rays of both hands and both feet consistent with rheumatoid arthritis and osteoarthritis overlap.  No erosive changes noted.   Unable to compare to previous x-rays.   We will repeat x-rays in 2-3 years.  No changes recommended at this time.

## 2020-03-30 NOTE — Telephone Encounter (Signed)
Spoke with patient and review lab results and x-rays with patient.

## 2020-03-30 NOTE — Progress Notes (Signed)
Anemia is stable.  Creatinine is elevated-1.20.  GFR is low-47.  Please advise the patient to avoid taking NSAIDs. Please forward lab work to PCP.

## 2020-04-04 DIAGNOSIS — E1165 Type 2 diabetes mellitus with hyperglycemia: Secondary | ICD-10-CM | POA: Diagnosis not present

## 2020-04-24 ENCOUNTER — Other Ambulatory Visit: Payer: Self-pay | Admitting: Rheumatology

## 2020-04-25 NOTE — Telephone Encounter (Signed)
Next Visit: 08/29/2020  Last Visit: 03/29/2020,   Last Fill: 02/11/2020  DX: Rheumatoid arthritis involving multiple sites with positive rheumatoid factor   Current Dose per office note 03/29/2020, Sulfasalazine 500 mg 2 tablets in the morning and 1 tablet in the evening  Labs: 03/29/2020, Anemia is stable. Creatinine is elevated-1.20. GFR is low-47. Please advise the patient to avoid taking NSAIDs.  Okay to refill SSZ?

## 2020-05-05 DIAGNOSIS — E1165 Type 2 diabetes mellitus with hyperglycemia: Secondary | ICD-10-CM | POA: Diagnosis not present

## 2020-05-13 DIAGNOSIS — F17210 Nicotine dependence, cigarettes, uncomplicated: Secondary | ICD-10-CM | POA: Diagnosis not present

## 2020-05-13 DIAGNOSIS — Z6836 Body mass index (BMI) 36.0-36.9, adult: Secondary | ICD-10-CM | POA: Diagnosis not present

## 2020-05-13 DIAGNOSIS — M549 Dorsalgia, unspecified: Secondary | ICD-10-CM | POA: Diagnosis not present

## 2020-05-13 DIAGNOSIS — E1165 Type 2 diabetes mellitus with hyperglycemia: Secondary | ICD-10-CM | POA: Diagnosis not present

## 2020-05-13 DIAGNOSIS — Z299 Encounter for prophylactic measures, unspecified: Secondary | ICD-10-CM | POA: Diagnosis not present

## 2020-05-13 DIAGNOSIS — R52 Pain, unspecified: Secondary | ICD-10-CM | POA: Diagnosis not present

## 2020-05-13 DIAGNOSIS — D638 Anemia in other chronic diseases classified elsewhere: Secondary | ICD-10-CM | POA: Diagnosis not present

## 2020-05-13 DIAGNOSIS — I10 Essential (primary) hypertension: Secondary | ICD-10-CM | POA: Diagnosis not present

## 2020-06-03 DIAGNOSIS — E1165 Type 2 diabetes mellitus with hyperglycemia: Secondary | ICD-10-CM | POA: Diagnosis not present

## 2020-06-13 DIAGNOSIS — E1165 Type 2 diabetes mellitus with hyperglycemia: Secondary | ICD-10-CM | POA: Diagnosis not present

## 2020-06-13 DIAGNOSIS — F17210 Nicotine dependence, cigarettes, uncomplicated: Secondary | ICD-10-CM | POA: Diagnosis not present

## 2020-06-13 DIAGNOSIS — Z6836 Body mass index (BMI) 36.0-36.9, adult: Secondary | ICD-10-CM | POA: Diagnosis not present

## 2020-06-13 DIAGNOSIS — M549 Dorsalgia, unspecified: Secondary | ICD-10-CM | POA: Diagnosis not present

## 2020-06-13 DIAGNOSIS — E1122 Type 2 diabetes mellitus with diabetic chronic kidney disease: Secondary | ICD-10-CM | POA: Diagnosis not present

## 2020-06-13 DIAGNOSIS — I10 Essential (primary) hypertension: Secondary | ICD-10-CM | POA: Diagnosis not present

## 2020-06-13 DIAGNOSIS — Z299 Encounter for prophylactic measures, unspecified: Secondary | ICD-10-CM | POA: Diagnosis not present

## 2020-06-21 ENCOUNTER — Telehealth: Payer: Self-pay

## 2020-06-21 NOTE — Telephone Encounter (Signed)
Patient called requesting a copy of her x-ray and the report that Dr. Corliss Skains took of her back.  Patient states she scheduled an appointment with Dr. Conchita Paris, neurologist for tomorrow 06/22/20 at 2:30 and she just found out she needs to bring a copy to her appointment.  Patient requested a return call to let her know if she can stop by the office to pick it up.

## 2020-06-21 NOTE — Telephone Encounter (Signed)
I called patient, CD and report at front desk for patient to pick up.

## 2020-06-22 DIAGNOSIS — Z6839 Body mass index (BMI) 39.0-39.9, adult: Secondary | ICD-10-CM | POA: Diagnosis not present

## 2020-06-22 DIAGNOSIS — R030 Elevated blood-pressure reading, without diagnosis of hypertension: Secondary | ICD-10-CM | POA: Diagnosis not present

## 2020-06-22 DIAGNOSIS — M4316 Spondylolisthesis, lumbar region: Secondary | ICD-10-CM | POA: Diagnosis not present

## 2020-06-22 DIAGNOSIS — M5441 Lumbago with sciatica, right side: Secondary | ICD-10-CM | POA: Diagnosis not present

## 2020-06-22 DIAGNOSIS — G8929 Other chronic pain: Secondary | ICD-10-CM | POA: Diagnosis not present

## 2020-06-22 DIAGNOSIS — M5442 Lumbago with sciatica, left side: Secondary | ICD-10-CM | POA: Diagnosis not present

## 2020-06-27 ENCOUNTER — Other Ambulatory Visit: Payer: Self-pay | Admitting: Physician Assistant

## 2020-06-27 ENCOUNTER — Telehealth: Payer: Self-pay

## 2020-06-27 ENCOUNTER — Other Ambulatory Visit: Payer: Self-pay | Admitting: *Deleted

## 2020-06-27 DIAGNOSIS — G8929 Other chronic pain: Secondary | ICD-10-CM

## 2020-06-27 DIAGNOSIS — Z79899 Other long term (current) drug therapy: Secondary | ICD-10-CM

## 2020-06-27 NOTE — Telephone Encounter (Signed)
released

## 2020-06-27 NOTE — Telephone Encounter (Signed)
Patient called requesting her labwork orders be sent to [name] in [place] on [address].

## 2020-06-29 DIAGNOSIS — M1909 Primary osteoarthritis, other specified site: Secondary | ICD-10-CM | POA: Diagnosis not present

## 2020-07-05 DIAGNOSIS — E1165 Type 2 diabetes mellitus with hyperglycemia: Secondary | ICD-10-CM | POA: Diagnosis not present

## 2020-07-08 ENCOUNTER — Other Ambulatory Visit: Payer: Self-pay | Admitting: Physician Assistant

## 2020-07-08 NOTE — Telephone Encounter (Signed)
Next Visit: 08/29/2020  Last Visit: 03/29/2020  Last Fill: 04/25/2020  DX: Rheumatoid arthritis involving multiple sites with positive rheumatoid factor   Current Dose per office note 03/29/2020, sulfasalazine 500 mg 2 tablets in the morning and 1 tablet in the evening.  Labs: 03/29/2020, Anemia is stable. Creatinine is elevated-1.20. GFR is low-47. Please advise the patient to avoid taking NSAIDs. Please forward lab work to PCP.    I called patient, labs drawn at [facility] with orders from Dr. Sherryll Burger 06/2020, patient will call Dr. Margaretmary Eddy office to get those faxed to our office.  Okay to refill SSZ?

## 2020-07-11 ENCOUNTER — Telehealth: Payer: Self-pay | Admitting: *Deleted

## 2020-07-11 NOTE — Telephone Encounter (Addendum)
Labs received from: Dr. Sherryll Burger Drawn on: 06/29/2020 Reviewed by: Sherron Ales, PA-C  Labs drawn: CBC/ CMP  Results: MCHC 30.8    RDW 17.6    Glucose 112    Creatinine 1.35    GFR 43                BUN/Creatinine Ratio 11               Chloride 108    Carbon dioxide 18  Patient is on SSZ 2 tablets in the morning and 1 tablet in the evening.  Recommendation: Advise patient to avoid NSAIDS.

## 2020-07-11 NOTE — Telephone Encounter (Signed)
Patient returned call to the office. Patient advised to avoid NSAIDS. Patient expressed understanding.

## 2020-07-11 NOTE — Telephone Encounter (Signed)
Attempted to contact the patient and left message for patient to call the office.  

## 2020-07-14 DIAGNOSIS — F3112 Bipolar disorder, current episode manic without psychotic features, moderate: Secondary | ICD-10-CM | POA: Diagnosis not present

## 2020-08-04 DIAGNOSIS — E1165 Type 2 diabetes mellitus with hyperglycemia: Secondary | ICD-10-CM | POA: Diagnosis not present

## 2020-08-15 DIAGNOSIS — Z1331 Encounter for screening for depression: Secondary | ICD-10-CM | POA: Diagnosis not present

## 2020-08-15 DIAGNOSIS — Z7189 Other specified counseling: Secondary | ICD-10-CM | POA: Diagnosis not present

## 2020-08-15 DIAGNOSIS — M549 Dorsalgia, unspecified: Secondary | ICD-10-CM | POA: Diagnosis not present

## 2020-08-15 DIAGNOSIS — Z1339 Encounter for screening examination for other mental health and behavioral disorders: Secondary | ICD-10-CM | POA: Diagnosis not present

## 2020-08-15 DIAGNOSIS — I10 Essential (primary) hypertension: Secondary | ICD-10-CM | POA: Diagnosis not present

## 2020-08-15 DIAGNOSIS — Z299 Encounter for prophylactic measures, unspecified: Secondary | ICD-10-CM | POA: Diagnosis not present

## 2020-08-15 DIAGNOSIS — Z0000 Encounter for general adult medical examination without abnormal findings: Secondary | ICD-10-CM | POA: Diagnosis not present

## 2020-08-15 DIAGNOSIS — Z6836 Body mass index (BMI) 36.0-36.9, adult: Secondary | ICD-10-CM | POA: Diagnosis not present

## 2020-08-15 NOTE — Progress Notes (Signed)
Office Visit Note  Patient: Margaret [name]             Date of Birth: [birth year]           MRN: [id number]             PCP: Kirstie Peri, MD Referring: Kirstie Peri, MD Visit Date: 08/29/2020 Occupation: @GUAROCC @  Subjective:  Other (Patient reports she had COVID approximately 1 month ago.) Medication management.   History of Present Illness: Margaret [name] is a 70 y.o. female with a history of rheumatoid arthritis and osteoarthritis.  She states her rheumatoid arthritis is well controlled on sulfasalazine without any joint pain or joint inflammation.  She continues to have some discomfort from osteoarthritis.  She states her lower back has been hurting a lot.  She has MRI scheduled through Dr. 73 for the evaluation of her lower back pain.  She also has a DEXA scan is scheduled for evaluation of osteoporosis by Dr. Conchita Paris.  She had COVID-19 infection in May 2022.  She states she had severe cough after that which required inhalers.  She did not get back Slo-Bid on monoclonal antibodies.  She is planning to get further immunization in the future.  Activities of Daily Living:  Patient reports morning stiffness for several hours.   Patient Reports nocturnal pain.  Difficulty dressing/grooming: Denies Difficulty climbing stairs: Reports Difficulty getting out of chair: Denies Difficulty using hands for taps, buttons, cutlery, and/or writing: Denies  Review of Systems  Constitutional:  Positive for fatigue.  HENT:  Positive for mouth dryness. Negative for mouth sores and nose dryness.   Eyes:  Negative for pain, itching and dryness.  Respiratory:  Negative for shortness of breath and difficulty breathing.   Cardiovascular:  Negative for chest pain and palpitations.  Gastrointestinal:  Positive for constipation. Negative for blood in stool and diarrhea.  Endocrine: Negative for increased urination.  Genitourinary:  Negative for difficulty urinating.  Musculoskeletal:  Positive for  joint pain, joint pain, myalgias, morning stiffness, muscle tenderness and myalgias. Negative for joint swelling.  Skin:  Negative for color change, rash and redness.  Allergic/Immunologic: Negative for susceptible to infections.  Neurological:  Negative for dizziness, numbness, headaches and memory loss.  Hematological:  Negative for bruising/bleeding tendency.  Psychiatric/Behavioral:  Negative for confusion.    PMFS History:  Patient Active Problem List   Diagnosis Date Noted   Idiopathic chronic venous hypertension of both lower extremities with inflammation 06/18/2017   Closed displaced oblique fracture of shaft of right humerus 03/27/2017   Smoker 01/09/2016   High risk medication use 01/06/2016   RA (rheumatoid arthritis) (HCC) 01/05/2016   DDD (degenerative disc disease), lumbar 01/05/2016   Calcaneal spur 01/05/2016   Bipolar disorder (HCC) 01/05/2016   ADD (attention deficit disorder) 01/05/2016   Sleep apnea 01/05/2016   TMJ (dislocation of temporomandibular joint) 01/05/2016   Pedal edema 01/05/2016   Osteoarthritis of both hands 01/05/2016   Osteoarthritis of both feet 01/05/2016   Osteoarthritis of both knees 01/05/2016   GERD (gastroesophageal reflux disease) 06/24/2015   Constipation 06/24/2015   Spondylolisthesis at L4-L5 level 01/24/2015   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   CONTRACTURE OF SHOULDER JOINT 04/24/2007   DIABETES 07/30/2006    Past Medical History:  Diagnosis Date   ADD (attention deficit disorder) 01/05/2016   Arthritis    Asthma    Benign essential hypertension    Bipolar disorder (HCC)    hospitalized at [facility] for manic episodes   Calcaneal  spur 01/05/2016   DDD (degenerative disc disease), lumbar 01/05/2016   Diabetes mellitus without complication (HCC)    Diabetic neuropathy (HCC)    Dysrhythmia    takes diltiazem prescribed by medical doctor; has never seen cardiologist   Eczema    Gastroesophageal reflux disease    Humeral shaft  fracture    right   Hyperlipidemia    Hypersomnia    IBS (irritable bowel syndrome)    Myalgia    Noncompliance with CPAP treatment    Osteoarthritis of both feet 01/05/2016   Osteoarthritis of both hands 01/05/2016   Osteoarthritis of both knees 01/05/2016   Pedal edema 01/05/2016   Peripheral vascular disease (HCC)    RA (rheumatoid arthritis) (HCC) 01/05/2016   +RF, +CCP, Nodulous,    Rheumatoid arthritis (HCC)    Sleep apnea 2015   home sleep study    TMJ (dislocation of temporomandibular joint) 01/05/2016   Varicose veins     Family History  Problem Relation Age of Onset   Hypertension Father    Alcoholism Mother    Stroke Mother    AAA (abdominal aortic aneurysm) Brother    Gallstones Sister    Osteoporosis Sister    Alcoholism Brother    Diabetes Brother    Healthy Son    Healthy Daughter    Colon cancer Neg Hx    Colon polyps Neg Hx    Past Surgical History:  Procedure Laterality Date   BACK SURGERY     CESAREAN SECTION     COLONOSCOPY  2011   Dr. Loreta Ave in [place]   ELBOW SURGERY     ENDOVENOUS ABLATION SAPHENOUS VEIN W/ LASER     ORIF HUMERUS FRACTURE Right 03/29/2017   Procedure: OPEN REDUCTION INTERNAL FIXATION (ORIF) RIGHT HUMERAL SHAFT FRACTURE;  Surgeon: Nadara Mustard, MD;  Location: [facility] OR;  Service: Orthopedics;  Laterality: Right;   Social History   Social History Narrative   Not on file   Immunization History  Administered Date(s) Administered   Influenza, High Dose Seasonal PF 12/19/2017   Moderna Sars-Covid-2 Vaccination 02/26/2019, 04/03/2019   Tdap 12/19/2017   Zoster Recombinat (Shingrix) 12/19/2017, 03/03/2018     Objective: Vital Signs: BP 120/79 (BP Location: Left Arm, Patient Position: Sitting, Cuff Size: Normal)   Pulse 85   Ht 5' 2.5" (1.588 m)   Wt 218 lb 6.4 oz (99.1 kg)   BMI 39.31 kg/m    Physical Exam Vitals and nursing note reviewed.  Constitutional:      Appearance: She is well-developed.  HENT:     Head:  Normocephalic and atraumatic.  Eyes:     Conjunctiva/sclera: Conjunctivae normal.  Cardiovascular:     Rate and Rhythm: Normal rate and regular rhythm.     Heart sounds: Normal heart sounds.  Pulmonary:     Effort: Pulmonary effort is normal.     Breath sounds: Normal breath sounds.  Abdominal:     General: Bowel sounds are normal.     Palpations: Abdomen is soft.  Musculoskeletal:     Cervical back: Normal range of motion.  Lymphadenopathy:     Cervical: No cervical adenopathy.  Skin:    General: Skin is warm and dry.     Capillary Refill: Capillary refill takes less than 2 seconds.  Neurological:     Mental Status: She is alert and oriented to person, place, and time.  Psychiatric:        Behavior: Behavior normal.     Musculoskeletal Exam:  C-spine was not limited range of motion.  She has limited painful range of motion of her lumbar spine.  Shoulder joints, elbow joints, wrist joints, MCPs PIPs and DIPs with good range of motion with no synovitis.  Hip joints are difficult to assess in sitting position.  She was unable to lay down due to lower back pain.  Knee joints and ankles with good range of motion with no synovitis.  There was no tenderness over MTPs or PIPs.  CDAI Exam: CDAI Score: 0.4  Patient Global: 2 mm; Provider Global: 2 mm Swollen: 0 ; Tender: 0  Joint Exam 08/29/2020   No joint exam has been documented for this visit   There is currently no information documented on the homunculus. Go to the Rheumatology activity and complete the homunculus joint exam.  Investigation: No additional findings.  Imaging: No results found.  Recent Labs: Lab Results  Component Value Date   WBC 9.5 03/29/2020   HGB 10.8 (L) 03/29/2020   PLT 269 03/29/2020   NA 139 03/29/2020   K 4.3 03/29/2020   CL 109 03/29/2020   CO2 20 03/29/2020   GLUCOSE 76 03/29/2020   BUN 17 03/29/2020   CREATININE 1.20 (H) 03/29/2020   BILITOT 0.3 03/29/2020   ALKPHOS 87 01/11/2020   AST  16 03/29/2020   ALT 16 03/29/2020   PROT 6.6 03/29/2020   ALBUMIN 4.3 01/11/2020   CALCIUM 9.2 03/29/2020   GFRAA 54 (L) 03/29/2020    Jun 29, 2020 CBC WBC 7.1 hemoglobin 12.0 platelets 225, CMP showed GFR 43, creatinine 1.35, AST and ALT were normal.  Speciality Comments: No specialty comments available.  Procedures:  No procedures performed Allergies: Fluoxetine hcl (pmdd), Hydroxychloroquine, and Vicodin [hydrocodone-acetaminophen]   Assessment / Plan:     Visit Diagnoses: Rheumatoid arthritis involving multiple sites with positive rheumatoid factor (HCC)-patient had no synovitis on my examination.  She continues to have some discomfort due to osteoarthritis and degenerative disc disease.  High risk medication use - Sulfasalazine 500 mg 2 tablets in the morning and 1 tablet in the evening.  Her labs have been stable except for low GFR.  CBC was normal in July.  She is been advised to get labs every 3 months.  I advised her to hold sulfasalazine in case she develops an infection and resume after the infection resolves.  Recommendation regarding realization were placed in the AVS.  Primary osteoarthritis of both hands-joint protection muscle strengthening was discussed.  Primary osteoarthritis of both knees - She was evaluated by Dr. Madelon Lips and proceeded with an MRI on 12/20/2019.  The MRI was unremarkable overall.   Primary osteoarthritis of both feet-she denies discomfort.  Trochanteric bursitis of both hips-doing better.  DDD (degenerative disc disease), lumbar - She has a MRI of the lumbar spine is scheduled by Dr. Conchita Paris.  She continues to have lower back pain.  Spondylolisthesis at L4-L5 level  Osteopenia of multiple sites - DEXA ordered by PCP.  She has DEXA scheduled for tomorrow.  Stage 3a chronic kidney disease (HCC)-her creatinine is gradually increasing.  I will refer her to nephrology for evaluation.  Nail dystrophy-she has nail dystrophy in her hands and feet  most likely onychomycosis.  I advised her to schedule an appointment with the dermatologist.  Attention deficit disorder, unspecified hyperactivity presence  History of bipolar disorder  History of asthma  Essential hypertension  History of diabetes mellitus  History of humerus fracture  History of gastroesophageal reflux (GERD)  History of  IBS  Smoker-smoking cessation was discussed.  Association of the smoking with severity of rheumatoid arthritis was discussed.  COVID-19 virus infection - June 2022-patient states she developed chronic cough requiring inhalers but no other treatment was needed.  Orders: No orders of the defined types were placed in this encounter.  No orders of the defined types were placed in this encounter.   Follow-Up Instructions: Return in about 5 months (around 01/29/2021) for Rheumatoid arthritis.   Pollyann Savoy, MD  Note - This record has been created using Animal nutritionist.  Chart creation errors have been sought, but may not always  have been located. Such creation errors do not reflect on  the standard of medical care.

## 2020-08-29 ENCOUNTER — Ambulatory Visit: Payer: Medicare PPO | Admitting: Rheumatology

## 2020-08-29 ENCOUNTER — Other Ambulatory Visit: Payer: Self-pay

## 2020-08-29 ENCOUNTER — Encounter: Payer: Self-pay | Admitting: Rheumatology

## 2020-08-29 VITALS — BP 120/79 | HR 85 | Ht 62.5 in | Wt 218.4 lb

## 2020-08-29 DIAGNOSIS — M19071 Primary osteoarthritis, right ankle and foot: Secondary | ICD-10-CM | POA: Diagnosis not present

## 2020-08-29 DIAGNOSIS — M4316 Spondylolisthesis, lumbar region: Secondary | ICD-10-CM

## 2020-08-29 DIAGNOSIS — M5136 Other intervertebral disc degeneration, lumbar region: Secondary | ICD-10-CM

## 2020-08-29 DIAGNOSIS — M7062 Trochanteric bursitis, left hip: Secondary | ICD-10-CM

## 2020-08-29 DIAGNOSIS — M7061 Trochanteric bursitis, right hip: Secondary | ICD-10-CM | POA: Diagnosis not present

## 2020-08-29 DIAGNOSIS — M19042 Primary osteoarthritis, left hand: Secondary | ICD-10-CM

## 2020-08-29 DIAGNOSIS — M170 Bilateral primary osteoarthritis of knee: Secondary | ICD-10-CM | POA: Diagnosis not present

## 2020-08-29 DIAGNOSIS — L603 Nail dystrophy: Secondary | ICD-10-CM

## 2020-08-29 DIAGNOSIS — Z8639 Personal history of other endocrine, nutritional and metabolic disease: Secondary | ICD-10-CM

## 2020-08-29 DIAGNOSIS — Z8781 Personal history of (healed) traumatic fracture: Secondary | ICD-10-CM

## 2020-08-29 DIAGNOSIS — M0579 Rheumatoid arthritis with rheumatoid factor of multiple sites without organ or systems involvement: Secondary | ICD-10-CM | POA: Diagnosis not present

## 2020-08-29 DIAGNOSIS — F988 Other specified behavioral and emotional disorders with onset usually occurring in childhood and adolescence: Secondary | ICD-10-CM

## 2020-08-29 DIAGNOSIS — Z79899 Other long term (current) drug therapy: Secondary | ICD-10-CM

## 2020-08-29 DIAGNOSIS — M8589 Other specified disorders of bone density and structure, multiple sites: Secondary | ICD-10-CM

## 2020-08-29 DIAGNOSIS — F17200 Nicotine dependence, unspecified, uncomplicated: Secondary | ICD-10-CM

## 2020-08-29 DIAGNOSIS — M19041 Primary osteoarthritis, right hand: Secondary | ICD-10-CM

## 2020-08-29 DIAGNOSIS — I10 Essential (primary) hypertension: Secondary | ICD-10-CM

## 2020-08-29 DIAGNOSIS — N1831 Chronic kidney disease, stage 3a: Secondary | ICD-10-CM

## 2020-08-29 DIAGNOSIS — M19072 Primary osteoarthritis, left ankle and foot: Secondary | ICD-10-CM

## 2020-08-29 DIAGNOSIS — Z8659 Personal history of other mental and behavioral disorders: Secondary | ICD-10-CM

## 2020-08-29 DIAGNOSIS — U071 COVID-19: Secondary | ICD-10-CM

## 2020-08-29 DIAGNOSIS — Z8709 Personal history of other diseases of the respiratory system: Secondary | ICD-10-CM

## 2020-08-29 DIAGNOSIS — Z8719 Personal history of other diseases of the digestive system: Secondary | ICD-10-CM

## 2020-08-29 NOTE — Patient Instructions (Addendum)
Standing Labs We placed an order today for your standing lab work.   Please have your standing labs drawn in August and every 3 months  If possible, please have your labs drawn 2 weeks prior to your appointment so that the provider can discuss your results at your appointment.  Please note that you may see your imaging and lab results in MyChart before we have reviewed them. We may be awaiting multiple results to interpret others before contacting you. Please allow our office up to 72 hours to thoroughly review all of the results before contacting the office for clarification of your results.  We have open lab daily: Monday through Thursday from 1:30-4:30 PM and Friday from 1:30-4:00 PM at the office of Dr. Pollyann Savoy, [facility].   Please be advised, all patients with office appointments requiring lab work will take precedent over walk-in lab work.  If possible, please come for your lab work on Monday and Friday afternoons, as you may experience shorter wait times. The office is located at [address], [place], [place] [address] No appointment is necessary.   Labs are drawn by [facility]. Please bring your co-pay at the time of your lab draw.  You may receive a bill from [facility] for your lab work.  If you wish to have your labs drawn at another location, please call the office 24 hours in advance to send orders.  If you have any questions regarding directions or hours of operation,  please call [phone].   As a reminder, please drink plenty of water prior to coming for your lab work. Thanks  Vaccines You are taking a medication(s) that can suppress your immune system.  The following immunizations are recommended: Flu annually Covid-19  Td/Tdap (tetanus, diphtheria, pertussis) every 10 years Pneumonia (Prevnar 15 then Pneumovax 23 at least 1 year apart.  Alternatively, can take Prevnar 20 without needing additional dose) Shingrix (after age 65): 2 doses  from 4 weeks to 6 months apart  Please check with your PCP to make sure you are up to date.   If you test POSITIVE for COVID19 and have MILD to MODERATE symptoms: First, call your PCP if you would like to receive COVID19 treatment AND Hold your medications during the infection and for at least 1 week after your symptoms have resolved: Injectable medication (Benlysta, Cimzia, Cosentyx, Enbrel, Humira, Orencia, Remicade, Simponi, Stelara, Taltz, Tremfya) Methotrexate Leflunomide (Arava) Azathioprine Mycophenolate (Cellcept) Osborne Oman, or Rinvoq Otezla If you take Actemra or Kevzara, you DO NOT need to hold these for COVID19 infection.  If you test POSITIVE for COVID19 and have NO symptoms: First, call your PCP if you would like to receive COVID19 treatment AND Hold your medications for at least 10 days after the day that you tested positive Injectable medication (Benlysta, Cimzia, Cosentyx, Enbrel, Humira, Orencia, Remicade, Simponi, Stelara, Taltz, Tremfya) Methotrexate Leflunomide (Arava) Azathioprine Mycophenolate (Cellcept) Osborne Oman, or Rinvoq Otezla If you take Actemra or Kevzara, you DO NOT need to hold these for COVID19 infection.  If you have signs or symptoms of an infection or start antibiotics: First, call your PCP for workup of your infection. Hold your medication through the infection, until you complete your antibiotics, and until symptoms resolve if you take the following: Injectable medication (Actemra, Benlysta, Cimzia, Cosentyx, Enbrel, Humira, Kevzara, Orencia, Remicade, Simponi, Stelara, Taltz, Tremfya) Methotrexate Leflunomide (Arava) Mycophenolate (Cellcept) Osborne Oman, or Rinvoq  Heart Disease Prevention   Your inflammatory disease increases your risk of heart  disease which includes heart attack, stroke, atrial fibrillation (irregular heartbeats), high blood pressure, heart failure and atherosclerosis (plaque in the arteries).  It is  important to reduce your risk by:   Keep blood pressure, cholesterol, and blood sugar at healthy levels   Smoking Cessation   Maintain a healthy weight  BMI 20-25   Eat a healthy diet  Plenty of fresh fruit, vegetables, and whole grains  Limit saturated fats, foods high in sodium, and added sugars  DASH and Mediterranean diet   Increase physical activity  Recommend moderate physically activity for 150 minutes per week/ 30 minutes a day for five days a week These can be broken up into three separate ten-minute sessions during the day.   Reduce Stress  Meditation, slow breathing exercises, yoga, coloring books  Dental visits twice a year

## 2020-08-30 DIAGNOSIS — E2839 Other primary ovarian failure: Secondary | ICD-10-CM | POA: Diagnosis not present

## 2020-09-02 DIAGNOSIS — E1165 Type 2 diabetes mellitus with hyperglycemia: Secondary | ICD-10-CM | POA: Diagnosis not present

## 2020-09-15 DIAGNOSIS — M549 Dorsalgia, unspecified: Secondary | ICD-10-CM | POA: Diagnosis not present

## 2020-09-15 DIAGNOSIS — Z6836 Body mass index (BMI) 36.0-36.9, adult: Secondary | ICD-10-CM | POA: Diagnosis not present

## 2020-09-15 DIAGNOSIS — R52 Pain, unspecified: Secondary | ICD-10-CM | POA: Diagnosis not present

## 2020-09-15 DIAGNOSIS — Z299 Encounter for prophylactic measures, unspecified: Secondary | ICD-10-CM | POA: Diagnosis not present

## 2020-09-15 DIAGNOSIS — I10 Essential (primary) hypertension: Secondary | ICD-10-CM | POA: Diagnosis not present

## 2020-09-15 DIAGNOSIS — E1165 Type 2 diabetes mellitus with hyperglycemia: Secondary | ICD-10-CM | POA: Diagnosis not present

## 2020-09-15 DIAGNOSIS — F17210 Nicotine dependence, cigarettes, uncomplicated: Secondary | ICD-10-CM | POA: Diagnosis not present

## 2020-10-04 ENCOUNTER — Telehealth: Payer: Self-pay

## 2020-10-04 DIAGNOSIS — Z79899 Other long term (current) drug therapy: Secondary | ICD-10-CM

## 2020-10-04 NOTE — Telephone Encounter (Signed)
Lab Orders released.  

## 2020-10-04 NOTE — Telephone Encounter (Signed)
Patient called requesting her labwork orders be sent to [facility] in [place] on [address].     Patient states she plans to go on Thursday, 10/06/20.

## 2020-10-05 DIAGNOSIS — E1165 Type 2 diabetes mellitus with hyperglycemia: Secondary | ICD-10-CM | POA: Diagnosis not present

## 2020-10-06 DIAGNOSIS — E785 Hyperlipidemia, unspecified: Secondary | ICD-10-CM | POA: Diagnosis not present

## 2020-10-06 DIAGNOSIS — N183 Chronic kidney disease, stage 3 unspecified: Secondary | ICD-10-CM | POA: Diagnosis not present

## 2020-10-06 DIAGNOSIS — N1831 Chronic kidney disease, stage 3a: Secondary | ICD-10-CM | POA: Diagnosis not present

## 2020-10-06 DIAGNOSIS — N2581 Secondary hyperparathyroidism of renal origin: Secondary | ICD-10-CM | POA: Diagnosis not present

## 2020-10-06 DIAGNOSIS — E872 Acidosis: Secondary | ICD-10-CM | POA: Diagnosis not present

## 2020-10-06 DIAGNOSIS — D631 Anemia in chronic kidney disease: Secondary | ICD-10-CM | POA: Diagnosis not present

## 2020-10-06 DIAGNOSIS — Z8616 Personal history of COVID-19: Secondary | ICD-10-CM | POA: Diagnosis not present

## 2020-10-12 ENCOUNTER — Other Ambulatory Visit: Payer: Self-pay | Admitting: Nephrology

## 2020-10-12 DIAGNOSIS — N183 Chronic kidney disease, stage 3 unspecified: Secondary | ICD-10-CM

## 2020-10-13 ENCOUNTER — Other Ambulatory Visit: Payer: Self-pay | Admitting: Physician Assistant

## 2020-10-13 NOTE — Telephone Encounter (Signed)
Next Visit: 01/31/2021  Last Visit: 08/29/2020  Last Fill: 07/08/2020  DX:  Rheumatoid arthritis involving multiple sites with positive rheumatoid factor   Current Dose per office note 08/29/2020: Sulfasalazine 500 mg 2 tablets in the morning and 1 tablet in the evening.  Labs: 06/29/2020 MCHC 30.8               RDW 17.6               Glucose 112               Creatinine 1.35               GFR 43                BUN/Creatinine Ratio 11               Chloride 108               Carbon dioxide 18  Left message to advise patient she is due to update labs.     Okay to refill SSZ?

## 2020-10-14 ENCOUNTER — Telehealth: Payer: Self-pay | Admitting: *Deleted

## 2020-10-14 NOTE — Telephone Encounter (Signed)
Labs received from:[facility]  Drawn on:10/06/2020  Reviewed by:Sherron Ales, PA-C   Labs drawn: UA, Renal Function Panel, PR/CR Ratio, Microalb/Creat Ratio, CBC  Results: Glucose 50, Creatinine 1.27, GFR 46, Chloride 109, Protein Creatinine Ratio 542, RDW 18%

## 2020-10-18 ENCOUNTER — Other Ambulatory Visit: Payer: Self-pay

## 2020-10-18 ENCOUNTER — Ambulatory Visit
Admission: RE | Admit: 2020-10-18 | Discharge: 2020-10-18 | Disposition: A | Discharge status: Home / Self Care | Payer: Medicare PPO | Source: Ambulatory Visit | Attending: Physician Assistant | Admitting: Physician Assistant

## 2020-10-18 DIAGNOSIS — M545 Low back pain, unspecified: Secondary | ICD-10-CM | POA: Diagnosis not present

## 2020-10-18 DIAGNOSIS — G8929 Other chronic pain: Secondary | ICD-10-CM

## 2020-10-18 DIAGNOSIS — M48061 Spinal stenosis, lumbar region without neurogenic claudication: Secondary | ICD-10-CM | POA: Diagnosis not present

## 2020-10-19 ENCOUNTER — Ambulatory Visit
Admission: RE | Admit: 2020-10-19 | Discharge: 2020-10-19 | Disposition: A | Discharge status: Home / Self Care | Payer: Medicare PPO | Source: Ambulatory Visit | Attending: Nephrology | Admitting: Nephrology

## 2020-10-19 DIAGNOSIS — N183 Chronic kidney disease, stage 3 unspecified: Secondary | ICD-10-CM

## 2020-10-20 DIAGNOSIS — M48062 Spinal stenosis, lumbar region with neurogenic claudication: Secondary | ICD-10-CM | POA: Diagnosis not present

## 2020-10-20 DIAGNOSIS — M4316 Spondylolisthesis, lumbar region: Secondary | ICD-10-CM | POA: Diagnosis not present

## 2020-10-25 DIAGNOSIS — M961 Postlaminectomy syndrome, not elsewhere classified: Secondary | ICD-10-CM | POA: Diagnosis not present

## 2020-10-25 DIAGNOSIS — M48062 Spinal stenosis, lumbar region with neurogenic claudication: Secondary | ICD-10-CM | POA: Diagnosis not present

## 2020-10-27 DIAGNOSIS — F3112 Bipolar disorder, current episode manic without psychotic features, moderate: Secondary | ICD-10-CM | POA: Diagnosis not present

## 2020-11-04 DIAGNOSIS — E1165 Type 2 diabetes mellitus with hyperglycemia: Secondary | ICD-10-CM | POA: Diagnosis not present

## 2020-11-04 DIAGNOSIS — M48062 Spinal stenosis, lumbar region with neurogenic claudication: Secondary | ICD-10-CM | POA: Diagnosis not present

## 2020-11-23 DIAGNOSIS — Z6837 Body mass index (BMI) 37.0-37.9, adult: Secondary | ICD-10-CM | POA: Diagnosis not present

## 2020-11-23 DIAGNOSIS — R030 Elevated blood-pressure reading, without diagnosis of hypertension: Secondary | ICD-10-CM | POA: Diagnosis not present

## 2020-11-23 DIAGNOSIS — M48062 Spinal stenosis, lumbar region with neurogenic claudication: Secondary | ICD-10-CM | POA: Diagnosis not present

## 2020-12-05 DIAGNOSIS — E1165 Type 2 diabetes mellitus with hyperglycemia: Secondary | ICD-10-CM | POA: Diagnosis not present

## 2020-12-06 DIAGNOSIS — F17210 Nicotine dependence, cigarettes, uncomplicated: Secondary | ICD-10-CM | POA: Diagnosis not present

## 2020-12-06 DIAGNOSIS — Z6836 Body mass index (BMI) 36.0-36.9, adult: Secondary | ICD-10-CM | POA: Diagnosis not present

## 2020-12-06 DIAGNOSIS — Z299 Encounter for prophylactic measures, unspecified: Secondary | ICD-10-CM | POA: Diagnosis not present

## 2020-12-06 DIAGNOSIS — M1711 Unilateral primary osteoarthritis, right knee: Secondary | ICD-10-CM | POA: Diagnosis not present

## 2020-12-06 DIAGNOSIS — Z23 Encounter for immunization: Secondary | ICD-10-CM | POA: Diagnosis not present

## 2020-12-06 DIAGNOSIS — I10 Essential (primary) hypertension: Secondary | ICD-10-CM | POA: Diagnosis not present

## 2020-12-21 DIAGNOSIS — M1710 Unilateral primary osteoarthritis, unspecified knee: Secondary | ICD-10-CM | POA: Diagnosis not present

## 2020-12-21 DIAGNOSIS — F17210 Nicotine dependence, cigarettes, uncomplicated: Secondary | ICD-10-CM | POA: Diagnosis not present

## 2020-12-21 DIAGNOSIS — I10 Essential (primary) hypertension: Secondary | ICD-10-CM | POA: Diagnosis not present

## 2020-12-21 DIAGNOSIS — M65332 Trigger finger, left middle finger: Secondary | ICD-10-CM | POA: Diagnosis not present

## 2020-12-21 DIAGNOSIS — E1165 Type 2 diabetes mellitus with hyperglycemia: Secondary | ICD-10-CM | POA: Diagnosis not present

## 2020-12-21 DIAGNOSIS — Z6835 Body mass index (BMI) 35.0-35.9, adult: Secondary | ICD-10-CM | POA: Diagnosis not present

## 2020-12-21 DIAGNOSIS — Z1211 Encounter for screening for malignant neoplasm of colon: Secondary | ICD-10-CM | POA: Diagnosis not present

## 2020-12-21 DIAGNOSIS — Z299 Encounter for prophylactic measures, unspecified: Secondary | ICD-10-CM | POA: Diagnosis not present

## 2021-01-04 DIAGNOSIS — E1165 Type 2 diabetes mellitus with hyperglycemia: Secondary | ICD-10-CM | POA: Diagnosis not present

## 2021-01-17 NOTE — Progress Notes (Signed)
Office Visit Note  Patient: Margaret [name]             Date of Birth: Aug 24, 1950           MRN: [id number]             PCP: Monico Blitz, MD Referring: Monico Blitz, MD Visit Date: 01/31/2021 Occupation: @GUAROCC @  Subjective:  Lower back pain   History of Present Illness: Margaret [name] is a 70 y.o. female with history of seropositive rheumatoid arthritis, osteoarthritis, and DDD.  Patient is taking sulfasalazine 500 mg 2 tablets in the morning and 1 tablet in the evening.  She is tolerating sulfasalazine without any side effects.  She denies any signs or symptoms of a rheumatoid arthritis flare.  She experiences stiffness and soreness in both hands but denies any joint swelling. She continues to have chronic pain in her lower back.  She follows up closely at [facility].  She had an injection at the end of November with minimal improvement.  She also had a right knee joint injection in November which resolved her discomfort.  She denies any recent infections.       Activities of Daily Living:  Patient reports morning stiffness for 10 minutes.   Patient Reports nocturnal pain.  Difficulty dressing/grooming: Denies Difficulty climbing stairs: Reports Difficulty getting out of chair: Denies Difficulty using hands for taps, buttons, cutlery, and/or writing: Denies  Review of Systems  Constitutional:  Positive for fatigue.  HENT:  Positive for mouth dryness. Negative for mouth sores and nose dryness.   Eyes:  Positive for dryness. Negative for pain and visual disturbance.  Respiratory:  Negative for cough, hemoptysis, shortness of breath and difficulty breathing.   Cardiovascular:  Negative for chest pain, palpitations, hypertension and swelling in legs/feet.  Gastrointestinal:  Positive for constipation. Negative for blood in stool and diarrhea.  Endocrine: Negative for increased urination.  Genitourinary:  Negative for difficulty urinating and painful urination.   Musculoskeletal:  Positive for joint pain, joint pain, muscle weakness, morning stiffness and muscle tenderness. Negative for joint swelling, myalgias and myalgias.  Skin:  Positive for rash. Negative for color change, pallor, hair loss, nodules/bumps, skin tightness, ulcers and sensitivity to sunlight.  Allergic/Immunologic: Negative for susceptible to infections.  Neurological:  Negative for dizziness, headaches and weakness.  Hematological:  Negative for bruising/bleeding tendency and swollen glands.  Psychiatric/Behavioral:  Positive for sleep disturbance. Negative for depressed mood. The patient is not nervous/anxious.    PMFS History:  Patient Active Problem List   Diagnosis Date Noted   Idiopathic chronic venous hypertension of both lower extremities with inflammation 06/18/2017   Closed displaced oblique fracture of shaft of right humerus 03/27/2017   Smoker 01/09/2016   High risk medication use 01/06/2016   RA (rheumatoid arthritis) ([place]) 01/05/2016   DDD (degenerative disc disease), lumbar 01/05/2016   Calcaneal spur 01/05/2016   Bipolar disorder ([place]) 01/05/2016   ADD (attention deficit disorder) 01/05/2016   Sleep apnea 01/05/2016   TMJ (dislocation of temporomandibular joint) 01/05/2016   Pedal edema 01/05/2016   Osteoarthritis of both hands 01/05/2016   Osteoarthritis of both feet 01/05/2016   Osteoarthritis of both knees 01/05/2016   GERD (gastroesophageal reflux disease) 06/24/2015   Constipation 06/24/2015   Spondylolisthesis at L4-L5 level 01/24/2015   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   CONTRACTURE OF SHOULDER JOINT 04/24/2007   DIABETES 07/30/2006    Past Medical History:  Diagnosis Date   ADD (attention deficit disorder) 01/05/2016   Arthritis  Asthma    Benign essential hypertension    Bipolar disorder (HCC)    hospitalized at [facility] for manic episodes   Calcaneal spur 01/05/2016   DDD (degenerative disc disease), lumbar 01/05/2016   Diabetes mellitus  without complication ([place])    Diabetic neuropathy ([place])    Dysrhythmia    takes diltiazem prescribed by medical doctor; has never seen cardiologist   Eczema    Gastroesophageal reflux disease    Humeral shaft fracture    right   Hyperlipidemia    Hypersomnia    IBS (irritable bowel syndrome)    Myalgia    Noncompliance with CPAP treatment    Osteoarthritis of both feet 01/05/2016   Osteoarthritis of both hands 01/05/2016   Osteoarthritis of both knees 01/05/2016   Pedal edema 01/05/2016   Peripheral vascular disease (HCC)    RA (rheumatoid arthritis) ([place]) 01/05/2016   +RF, +CCP, Nodulous,    Rheumatoid arthritis (HCC)    Sleep apnea 2015   home sleep study    TMJ (dislocation of temporomandibular joint) 01/05/2016   Varicose veins     Family History  Problem Relation Age of Onset   Hypertension Father    Alcoholism Mother    Stroke Mother    AAA (abdominal aortic aneurysm) Brother    Gallstones Sister    Osteoporosis Sister    Alcoholism Brother    Diabetes Brother    Healthy Son    Healthy Daughter    Colon cancer Neg Hx    Colon polyps Neg Hx    Past Surgical History:  Procedure Laterality Date   BACK SURGERY     CESAREAN SECTION     COLONOSCOPY  2011   Dr. Collene Mares in [place]   [place] W/ LASER     ORIF HUMERUS FRACTURE Right 03/29/2017   Procedure: OPEN REDUCTION INTERNAL FIXATION (ORIF) RIGHT HUMERAL SHAFT FRACTURE;  Surgeon: Newt Minion, MD;  Location: [place];  Service: Orthopedics;  Laterality: Right;   Social History   Social History Narrative   Not on file   Immunization History  Administered Date(s) Administered   Influenza, High Dose Seasonal PF 12/19/2017   Moderna Sars-Covid-2 Vaccination 02/26/2019, 04/03/2019   Tdap 12/19/2017   Zoster Recombinat (Shingrix) 12/19/2017, 03/03/2018     Objective: Vital Signs: BP 136/78 (BP Location: Left Arm, Patient Position: Sitting, Cuff Size: Normal)     Pulse 97    Resp 17    Ht 5' 2.5" (1.588 m)    Wt 210 lb (95.3 kg)    BMI 37.80 kg/m    Physical Exam Vitals and nursing note reviewed.  Constitutional:      Appearance: She is well-developed.  HENT:     Head: Normocephalic and atraumatic.  Eyes:     Conjunctiva/sclera: Conjunctivae normal.  Pulmonary:     Effort: Pulmonary effort is normal.  Abdominal:     Palpations: Abdomen is soft.  Musculoskeletal:     Cervical back: Normal range of motion.  Skin:    General: Skin is warm and dry.     Capillary Refill: Capillary refill takes less than 2 seconds.  Neurological:     Mental Status: She is alert and oriented to person, place, and time.  Psychiatric:        Behavior: Behavior normal.     Musculoskeletal Exam: C-spine has good range of motion with no discomfort.  Shoulder joints, elbow joints, wrist joints, MCPs, PIPs, DIPs  have good range of motion with no synovitis.  PIP and DIP thickening consistent with osteoarthritis of both hands.  Complete fist formation bilaterally.  Hip joints have good range of motion with no discomfort.  Knee joints have good range of motion with no warmth or effusion.  Ankle joints have good range of motion with no tenderness or joint swelling.  No tenderness over MTP joints.  CDAI Exam: CDAI Score: 0.2  Patient Global: 1 mm; Provider Global: 1 mm Swollen: 0 ; Tender: 0  Joint Exam 01/31/2021   No joint exam has been documented for this visit   There is currently no information documented on the homunculus. Go to the Rheumatology activity and complete the homunculus joint exam.  Investigation: No additional findings.  Imaging: No results found.  Recent Labs: Lab Results  Component Value Date   WBC 9.5 03/29/2020   HGB 10.8 (L) 03/29/2020   PLT 269 03/29/2020   NA 139 03/29/2020   K 4.3 03/29/2020   CL 109 03/29/2020   CO2 20 03/29/2020   GLUCOSE 76 03/29/2020   BUN 17 03/29/2020   CREATININE 1.20 (H) 03/29/2020   BILITOT 0.3  03/29/2020   ALKPHOS 87 01/11/2020   AST 16 03/29/2020   ALT 16 03/29/2020   PROT 6.6 03/29/2020   ALBUMIN 4.3 01/11/2020   CALCIUM 9.2 03/29/2020   GFRAA 54 (L) 03/29/2020    Speciality Comments: No specialty comments available.  Procedures:  No procedures performed Allergies: Fluoxetine hcl (pmdd), Hydroxychloroquine, Acetaminophen, Hydrocodone, Hydroxychloroquine sulfate, and Vicodin [hydrocodone-acetaminophen]   Assessment / Plan:     Visit Diagnoses: Rheumatoid arthritis involving multiple sites with positive rheumatoid factor ([place]): She has no joint tenderness or synovitis on examination today.  She has not had any signs or symptoms of a rheumatoid arthritis flare recently.  She has clinically been doing well on sulfasalazine 500 mg 2 tablets in the morning and 1 tablet in the evening.  She continues to tolerate sulfasalazine without any side effects and has not missed any doses recently.  Her morning stiffness has been lasting about 10 minutes daily.  She has been experiencing persistent lower back pain unrelated to rheumatoid arthritis which has been being followed closely by Dr. Kathyrn Sheriff.  Overall her rheumatoid arthritis is well controlled on the current dose of sulfasalazine.  No medication changes will be made at this time.  She was advised to notify us if she develops signs or symptoms of a flare.  She will follow-up in the office in 5 months.  High risk medication use - Sulfasalazine 500 mg 2 tablets in the morning and 1 tablet in the evening.   CBC and CMP updated on 10/06/20. She is due to update lab work today.  Orders released.  Her next lab work will be due in April and every 3 months.  Standing orders for CBC and CMP are in place.  - Plan: CBC with Differential/Platelet, COMPLETE METABOLIC PANEL WITH GFR She has not had any recent infections.  Discussed the importance of holding sulfasalazine if she develops signs or symptoms of an infection and to resume once the infection  has completely cleared.  Primary osteoarthritis of both hands: She has PIP and DIP thickening consistent with osteoarthritis of both hands.  No tenderness or inflammation noted.  Complete fist formation bilaterally.  Joint protection and muscle strengthening were discussed today in detail.  Primary osteoarthritis of both knees - She was evaluated by Dr. French Ana and proceeded with an MRI of the  right knee on 12/20/2019.  The MRI was unremarkable overall.  She had a right knee joint cortisone injection performed in November 2022 which provided significant relief.  She good range of motion of both knee joints on examination today with no warmth or effusion.  Primary osteoarthritis of both feet: She is not experiencing any increased discomfort in her feet currently.  Both ankle joints have good range of motion with no discomfort.  No tenderness or inflammation was noted.  Trochanteric bursitis of both hips: Resolved.   DDD (degenerative disc disease), lumbar - S/p fusion by Dr. Conchita Paris.  She continues to have chronic pain in her lower back.  She experiences radiating pain down both lower extremities.  She had an injection performed at the end of November 2022 with minimal improvement in her symptoms.  She plans on scheduling an appointment with Dr. Conchita Paris to discuss other treatment options.   Spondylolisthesis at L4-L5 level: Chronic pain.  Osteopenia of multiple sites - DEXA ordered by PCP.   Stage 3a chronic kidney disease [facility]): She establish care at [facility].  CMP with GFR will be updated today and results will be forwarded to her nephrologist.  She has upcoming appointment in a couple of months.  Other medical conditions are listed as follows:  Nail dystrophy: Unchanged.  History of bipolar disorder  Attention deficit disorder, unspecified hyperactivity presence  Essential hypertension  History of asthma  History of humerus fracture  History of diabetes  mellitus  History of IBS  History of gastroesophageal reflux (GERD)  Smoker  COVID-19 virus infection - June 2022  Orders: Orders Placed This Encounter  Procedures   CBC with Differential/Platelet   COMPLETE METABOLIC PANEL WITH GFR   No orders of the defined types were placed in this encounter.    Follow-Up Instructions: Return in about 5 months (around 07/01/2021) for Rheumatoid arthritis, Osteoarthritis, DDD.   Gearldine Bienenstock, PA-C  Note - This record has been created using Dragon software.  Chart creation errors have been sought, but may not always  have been located. Such creation errors do not reflect on  the standard of medical care.

## 2021-01-18 ENCOUNTER — Other Ambulatory Visit: Payer: Self-pay | Admitting: Physician Assistant

## 2021-01-18 NOTE — Telephone Encounter (Signed)
Next Visit: 01/31/2021  Last Visit: 08/29/2020  Last Fill: 10/13/2020  DX: Rheumatoid arthritis involving multiple sites with positive rheumatoid factor  Current Dose per office note 08/29/2020: Sulfasalazine 500 mg 2 tablets in the morning and 1 tablet in the evening  Labs: 10/06/2020 Glucose 50, Creatinine 1.27, GFR 46, Chloride 109, RDW 18%  Patient to update labs at upcoming appointment on 01/31/2021.   Okay to refill SSZ?

## 2021-01-31 ENCOUNTER — Other Ambulatory Visit: Payer: Self-pay

## 2021-01-31 ENCOUNTER — Ambulatory Visit: Payer: Medicare PPO | Admitting: Physician Assistant

## 2021-01-31 ENCOUNTER — Encounter: Payer: Self-pay | Admitting: Physician Assistant

## 2021-01-31 VITALS — BP 136/78 | HR 97 | Resp 17 | Ht 62.5 in | Wt 210.0 lb

## 2021-01-31 DIAGNOSIS — M170 Bilateral primary osteoarthritis of knee: Secondary | ICD-10-CM | POA: Diagnosis not present

## 2021-01-31 DIAGNOSIS — M5136 Other intervertebral disc degeneration, lumbar region: Secondary | ICD-10-CM

## 2021-01-31 DIAGNOSIS — M7061 Trochanteric bursitis, right hip: Secondary | ICD-10-CM

## 2021-01-31 DIAGNOSIS — M19041 Primary osteoarthritis, right hand: Secondary | ICD-10-CM

## 2021-01-31 DIAGNOSIS — L603 Nail dystrophy: Secondary | ICD-10-CM

## 2021-01-31 DIAGNOSIS — F17200 Nicotine dependence, unspecified, uncomplicated: Secondary | ICD-10-CM

## 2021-01-31 DIAGNOSIS — M19042 Primary osteoarthritis, left hand: Secondary | ICD-10-CM

## 2021-01-31 DIAGNOSIS — M8589 Other specified disorders of bone density and structure, multiple sites: Secondary | ICD-10-CM

## 2021-01-31 DIAGNOSIS — M0579 Rheumatoid arthritis with rheumatoid factor of multiple sites without organ or systems involvement: Secondary | ICD-10-CM

## 2021-01-31 DIAGNOSIS — N1831 Chronic kidney disease, stage 3a: Secondary | ICD-10-CM

## 2021-01-31 DIAGNOSIS — Z8639 Personal history of other endocrine, nutritional and metabolic disease: Secondary | ICD-10-CM

## 2021-01-31 DIAGNOSIS — M19071 Primary osteoarthritis, right ankle and foot: Secondary | ICD-10-CM | POA: Diagnosis not present

## 2021-01-31 DIAGNOSIS — M4316 Spondylolisthesis, lumbar region: Secondary | ICD-10-CM

## 2021-01-31 DIAGNOSIS — I10 Essential (primary) hypertension: Secondary | ICD-10-CM

## 2021-01-31 DIAGNOSIS — Z8719 Personal history of other diseases of the digestive system: Secondary | ICD-10-CM

## 2021-01-31 DIAGNOSIS — M7062 Trochanteric bursitis, left hip: Secondary | ICD-10-CM

## 2021-01-31 DIAGNOSIS — Z8781 Personal history of (healed) traumatic fracture: Secondary | ICD-10-CM

## 2021-01-31 DIAGNOSIS — U071 COVID-19: Secondary | ICD-10-CM

## 2021-01-31 DIAGNOSIS — Z79899 Other long term (current) drug therapy: Secondary | ICD-10-CM

## 2021-01-31 DIAGNOSIS — Z8709 Personal history of other diseases of the respiratory system: Secondary | ICD-10-CM

## 2021-01-31 DIAGNOSIS — Z8659 Personal history of other mental and behavioral disorders: Secondary | ICD-10-CM

## 2021-01-31 DIAGNOSIS — F988 Other specified behavioral and emotional disorders with onset usually occurring in childhood and adolescence: Secondary | ICD-10-CM

## 2021-01-31 DIAGNOSIS — M51369 Other intervertebral disc degeneration, lumbar region without mention of lumbar back pain or lower extremity pain: Secondary | ICD-10-CM

## 2021-01-31 DIAGNOSIS — M19072 Primary osteoarthritis, left ankle and foot: Secondary | ICD-10-CM

## 2021-02-01 LAB — COMPLETE METABOLIC PANEL WITH GFR
AG Ratio: 2 (calc) (ref 1.0–2.5)
ALT: 8 U/L (ref 6–29)
AST: 12 U/L (ref 10–35)
Albumin: 4.3 g/dL (ref 3.6–5.1)
Alkaline phosphatase (APISO): 65 U/L (ref 37–153)
BUN/Creatinine Ratio: 11 (calc) (ref 6–22)
BUN: 13 mg/dL (ref 7–25)
CO2: 20 mmol/L (ref 20–32)
Calcium: 8.7 mg/dL (ref 8.6–10.4)
Chloride: 110 mmol/L (ref 98–110)
Creat: 1.15 mg/dL — ABNORMAL HIGH (ref 0.50–1.05)
Globulin: 2.1 g/dL (calc) (ref 1.9–3.7)
Glucose, Bld: 129 mg/dL — ABNORMAL HIGH (ref 65–99)
Potassium: 4.5 mmol/L (ref 3.5–5.3)
Sodium: 139 mmol/L (ref 135–146)
Total Bilirubin: 0.3 mg/dL (ref 0.2–1.2)
Total Protein: 6.4 g/dL (ref 6.1–8.1)
eGFR: 52 mL/min/{1.73_m2} — ABNORMAL LOW (ref >=60)

## 2021-02-01 LAB — CBC WITH DIFFERENTIAL/PLATELET
Absolute Monocytes: 515 cells/uL (ref 200–950)
Basophils Absolute: 53 cells/uL (ref 0–200)
Basophils Relative: 0.8 %
Eosinophils Absolute: 112 cells/uL (ref 15–500)
Eosinophils Relative: 1.7 %
HCT: 44.1 % (ref 35.0–45.0)
Hemoglobin: 14.5 g/dL (ref 11.7–15.5)
Lymphs Abs: 1822 cells/uL (ref 850–3900)
MCH: 32.1 pg (ref 27.0–33.0)
MCHC: 32.9 g/dL (ref 32.0–36.0)
MCV: 97.6 fL (ref 80.0–100.0)
MPV: 9.5 fL (ref 7.5–12.5)
Monocytes Relative: 7.8 %
Neutro Abs: 4099 cells/uL (ref 1500–7800)
Neutrophils Relative %: 62.1 %
Platelets: 222 10*3/uL (ref 140–400)
RBC: 4.52 10*6/uL (ref 3.80–5.10)
RDW: 14.5 % (ref 11.0–15.0)
Total Lymphocyte: 27.6 %
WBC: 6.6 10*3/uL (ref 3.8–10.8)

## 2021-02-01 NOTE — Progress Notes (Signed)
CBC WNL.  Creatinine remains slightly elevated-1.15 and GFR is low at 52.  Rest of CMP WNL. Avoid the use of NSAIDs.  Forward lab work to PCP as requested.

## 2021-02-03 DIAGNOSIS — E1165 Type 2 diabetes mellitus with hyperglycemia: Secondary | ICD-10-CM | POA: Diagnosis not present

## 2021-02-22 DIAGNOSIS — M5136 Other intervertebral disc degeneration, lumbar region: Secondary | ICD-10-CM | POA: Diagnosis not present

## 2021-02-22 DIAGNOSIS — M48061 Spinal stenosis, lumbar region without neurogenic claudication: Secondary | ICD-10-CM | POA: Diagnosis not present

## 2021-02-24 ENCOUNTER — Other Ambulatory Visit: Payer: Self-pay | Admitting: Neurosurgery

## 2021-02-28 DIAGNOSIS — F3112 Bipolar disorder, current episode manic without psychotic features, moderate: Secondary | ICD-10-CM | POA: Diagnosis not present

## 2021-03-05 DIAGNOSIS — E1165 Type 2 diabetes mellitus with hyperglycemia: Secondary | ICD-10-CM | POA: Diagnosis not present

## 2021-03-15 ENCOUNTER — Other Ambulatory Visit: Payer: Self-pay | Admitting: Rheumatology

## 2021-03-15 NOTE — Telephone Encounter (Signed)
Next Visit: 07/05/2021  Last Visit: 01/31/2021  Last Fill: 01/18/2021  DX: Rheumatoid arthritis involving multiple sites with positive rheumatoid factor  Current Dose per office note 01/31/2021: Sulfasalazine 500 mg 2 tablets in the morning and 1 tablet in the evening.    Labs: 01/31/2021 CBC WNL.  Creatinine remains slightly elevated-1.15 and GFR is low at 52.  Rest of CMP WNL.  Okay to refill SSZ?

## 2021-03-29 ENCOUNTER — Other Ambulatory Visit: Payer: Self-pay | Admitting: Neurosurgery

## 2021-03-29 DIAGNOSIS — Z6834 Body mass index (BMI) 34.0-34.9, adult: Secondary | ICD-10-CM | POA: Diagnosis not present

## 2021-03-29 DIAGNOSIS — F17210 Nicotine dependence, cigarettes, uncomplicated: Secondary | ICD-10-CM | POA: Diagnosis not present

## 2021-03-29 DIAGNOSIS — E1165 Type 2 diabetes mellitus with hyperglycemia: Secondary | ICD-10-CM | POA: Diagnosis not present

## 2021-03-29 DIAGNOSIS — I10 Essential (primary) hypertension: Secondary | ICD-10-CM | POA: Diagnosis not present

## 2021-03-29 DIAGNOSIS — Z299 Encounter for prophylactic measures, unspecified: Secondary | ICD-10-CM | POA: Diagnosis not present

## 2021-03-29 DIAGNOSIS — M7712 Lateral epicondylitis, left elbow: Secondary | ICD-10-CM | POA: Diagnosis not present

## 2021-03-29 DIAGNOSIS — M549 Dorsalgia, unspecified: Secondary | ICD-10-CM | POA: Diagnosis not present

## 2021-04-04 DIAGNOSIS — E1165 Type 2 diabetes mellitus with hyperglycemia: Secondary | ICD-10-CM | POA: Diagnosis not present

## 2021-04-13 DIAGNOSIS — E1122 Type 2 diabetes mellitus with diabetic chronic kidney disease: Secondary | ICD-10-CM | POA: Diagnosis not present

## 2021-04-13 DIAGNOSIS — D631 Anemia in chronic kidney disease: Secondary | ICD-10-CM | POA: Diagnosis not present

## 2021-04-13 DIAGNOSIS — N183 Chronic kidney disease, stage 3 unspecified: Secondary | ICD-10-CM | POA: Diagnosis not present

## 2021-04-13 DIAGNOSIS — N2581 Secondary hyperparathyroidism of renal origin: Secondary | ICD-10-CM | POA: Diagnosis not present

## 2021-04-27 DIAGNOSIS — Z6834 Body mass index (BMI) 34.0-34.9, adult: Secondary | ICD-10-CM | POA: Diagnosis not present

## 2021-04-27 DIAGNOSIS — J019 Acute sinusitis, unspecified: Secondary | ICD-10-CM | POA: Diagnosis not present

## 2021-04-27 DIAGNOSIS — F17210 Nicotine dependence, cigarettes, uncomplicated: Secondary | ICD-10-CM | POA: Diagnosis not present

## 2021-04-27 DIAGNOSIS — Z299 Encounter for prophylactic measures, unspecified: Secondary | ICD-10-CM | POA: Diagnosis not present

## 2021-04-27 DIAGNOSIS — I10 Essential (primary) hypertension: Secondary | ICD-10-CM | POA: Diagnosis not present

## 2021-05-01 NOTE — Pre-Procedure Instructions (Signed)
Surgical Instructions ? ? ? Your procedure is scheduled on 05/09/2021. ? Report to [facility] Main Entrance "A" at 05:30 A.M., then check in with the Admitting office. ? Call this number if you have problems the morning of surgery: ? [phone] ? ? If you have any questions prior to your surgery date call [phone]: Open Monday-Friday 8am-4pm ? ? ? Remember: ? Do not eat or drink after midnight the night before your surgery ? ? Take these medicines the morning of surgery with A SIP OF WATER:  ?amoxicillin-clavulanate (AUGMENTIN ?atomoxetine (STRATTERA) ?buPROPion (WELLBUTRIN XL ?diltiazem (CARDIZEM CD) ?gabapentin (NEURONTIN ?pantoprazole (PROTONIX) ?topiramate (TOPAMAX) ?sulfaSALAzine (AZULFIDINE) ? ? ?As of today, STOP taking any Aspirin (unless otherwise instructed by your surgeon) Aleve, Naproxen, Ibuprofen, Motrin, Advil, Goody's, BC's, all herbal medications, fish oil, and all vitamins. ? ?WHAT DO I DO ABOUT MY DIABETES MEDICATION? ? ? ?Do not take glimepiride (AMARYL) morning of surgery. ? ?Take 50% of TOUJEO SOLOSTAR ? ?The day of surgery, do not take other diabetes injectables OZEMPIC ? ?If your CBG is greater than 220 mg/dL, you may take ? of your sliding scale (correction) dose of insulin. ? ? ?HOW TO MANAGE YOUR DIABETES ?BEFORE AND AFTER SURGERY ? ?Why is it important to control my blood sugar before and after surgery? ?Improving blood sugar levels before and after surgery helps healing and can limit problems. ?A way of improving blood sugar control is eating a healthy diet by: ? Eating less sugar and carbohydrates ? Increasing activity/exercise ? Talking with your doctor about reaching your blood sugar goals ?High blood sugars (greater than 180 mg/dL) can raise your risk of infections and slow your recovery, so you will need to focus on controlling your diabetes during the weeks before surgery. ?Make sure that the doctor who takes care of your diabetes knows about your planned surgery including the  date and location. ? ?How do I manage my blood sugar before surgery? ?Check your blood sugar at least 4 times a day, starting 2 days before surgery, to make sure that the level is not too high or low. ? ?Check your blood sugar the morning of your surgery when you wake up and every 2 hours until you get to the Short Stay unit. ? ?If your blood sugar is less than 70 mg/dL, you will need to treat for low blood sugar: ?Do not take insulin. ?Treat a low blood sugar (less than 70 mg/dL) with ? cup of clear juice (cranberry or apple), 4 glucose tablets, OR glucose gel. ?Recheck blood sugar in 15 minutes after treatment (to make sure it is greater than 70 mg/dL). If your blood sugar is not greater than 70 mg/dL on recheck, call [phone] for further instructions. ?Report your blood sugar to the short stay nurse when you get to Short Stay. ? ?If you are admitted to the hospital after surgery: ?Your blood sugar will be checked by the staff and you will probably be given insulin after surgery (instead of oral diabetes medicines) to make sure you have good blood sugar levels. ?The goal for blood sugar control after surgery is 80-180 mg/dL.  ? ?        ?Do not wear jewelry or makeup ?Do not wear lotions, powders, perfumes/colognes, or deodorant. ?Do not shave 48 hours prior to surgery.  Men may shave face and neck. ?Do not bring valuables to the hospital. ?Do not wear nail polish, gel polish, artificial nails, or any other type of covering on natural nails (  fingers and toes) ?If you have artificial nails or gel coating that need to be removed by a nail salon, please have this removed prior to surgery. Artificial nails or gel coating may interfere with anesthesia's ability to adequately monitor your vital signs. ? ?[facility] is not responsible for any belongings or valuables. .  ? ?Do NOT Smoke (Tobacco/Vaping)  24 hours prior to your procedure ? ?If you use a CPAP at night, you may bring your mask for your overnight stay. ?   ?Contacts, glasses, hearing aids, dentures or partials may not be worn into surgery, please bring cases for these belongings ?  ?For patients admitted to the hospital, discharge time will be determined by your treatment team. ?  ?Patients discharged the day of surgery will not be allowed to drive home, and someone needs to stay with them for 24 hours. ? ? ?SURGICAL WAITING ROOM VISITATION ?Patients having surgery or a procedure in a hospital may have two support people. ?Children under the age of 69 must have an adult with them who is not the patient. ?They may stay in the waiting area during the procedure and may switch out with other visitors. If the patient needs to stay at the hospital during part of their recovery, the visitor guidelines for inpatient rooms apply. ? ?Please refer to the  website for the visitor guidelines for Inpatients (after your surgery is over and you are in a regular room).  ? ? ? ? ? ?Special instructions:   ? ?Oral Hygiene is also important to reduce your risk of infection.  Remember - BRUSH YOUR TEETH THE MORNING OF SURGERY WITH YOUR REGULAR TOOTHPASTE ? ? ?[place] [facility]- Preparing For Surgery ? ?Before surgery, you can play an important role. Because skin is not sterile, your skin needs to be as free of germs as possible. You can reduce the number of germs on your skin by washing with CHG (chlorahexidine gluconate) Soap before surgery.  CHG is an antiseptic cleaner which kills germs and bonds with the skin to continue killing germs even after washing.   ? ? ?Please do not use if you have an allergy to CHG or antibacterial soaps. If your skin becomes reddened/irritated stop using the CHG.  ?Do not shave (including legs and underarms) for at least 48 hours prior to first CHG shower. It is OK to shave your face. ? ?Please follow these instructions carefully. ?  ? ? Shower the NIGHT BEFORE SURGERY and the MORNING OF SURGERY with CHG Soap.  ? If you chose to wash your hair, wash  your hair first as usual with your normal shampoo. After you shampoo, rinse your hair and body thoroughly to remove the shampoo.  Then Nucor CorporationWash Face and genitals (private parts) with your normal soap and rinse thoroughly to remove soap. ? ?After that Use CHG Soap as you would any other liquid soap. You can apply CHG directly to the skin and wash gently with a scrungie or a clean washcloth.  ? ?Apply the CHG Soap to your body ONLY FROM THE NECK DOWN.  Do not use on open wounds or open sores. Avoid contact with your eyes, ears, mouth and genitals (private parts). Wash Face and genitals (private parts)  with your normal soap.  ? ?Wash thoroughly, paying special attention to the area where your surgery will be performed. ? ?Thoroughly rinse your body with warm water from the neck down. ? ?DO NOT shower/wash with your normal soap after using and rinsing  off the CHG Soap. ? ?Pat yourself dry with a CLEAN TOWEL. ? ?Wear CLEAN PAJAMAS to bed the night before surgery ? ?Place CLEAN SHEETS on your bed the night before your surgery ? ?DO NOT SLEEP WITH PETS. ? ? ?Day of Surgery: ? ?Take a shower with CHG soap. ?Wear Clean/Comfortable clothing the morning of surgery ?Do not apply any deodorants/lotions.   ?Remember to brush your teeth WITH YOUR REGULAR TOOTHPASTE. ? ? ? ?If you received a COVID test during your pre-op visit  it is requested that you wear a mask when out in public, stay away from anyone that may not be feeling well and notify your surgeon if you develop symptoms. If you have been in contact with anyone that has tested positive in the last 10 days please notify you surgeon. ? ?  ?Please read over the following fact sheets that you were given.   ?

## 2021-05-02 ENCOUNTER — Encounter (HOSPITAL_COMMUNITY): Payer: Self-pay

## 2021-05-02 ENCOUNTER — Other Ambulatory Visit: Payer: Self-pay

## 2021-05-02 ENCOUNTER — Encounter (HOSPITAL_COMMUNITY)
Admission: RE | Admit: 2021-05-02 | Discharge: 2021-05-02 | Disposition: A | Discharge status: Home / Self Care | Payer: Medicare PPO | Source: Ambulatory Visit | Attending: Neurosurgery | Admitting: Neurosurgery

## 2021-05-02 VITALS — BP 150/67 | HR 94 | Temp 98.4°F | Resp 18 | Ht 62.0 in | Wt 203.4 lb

## 2021-05-02 DIAGNOSIS — I10 Essential (primary) hypertension: Secondary | ICD-10-CM | POA: Diagnosis not present

## 2021-05-02 DIAGNOSIS — Z01818 Encounter for other preprocedural examination: Secondary | ICD-10-CM | POA: Insufficient documentation

## 2021-05-02 HISTORY — DX: Anemia, unspecified: D64.9

## 2021-05-02 HISTORY — DX: Chronic kidney disease, unspecified: N18.9

## 2021-05-02 LAB — BASIC METABOLIC PANEL
Anion gap: 9 (ref 5–15)
BUN: 13 mg/dL (ref 8–23)
CO2: 17 mmol/L — ABNORMAL LOW (ref 22–32)
Calcium: 8.9 mg/dL (ref 8.9–10.3)
Chloride: 112 mmol/L — ABNORMAL HIGH (ref 98–111)
Creatinine, Ser: 1.21 mg/dL — ABNORMAL HIGH (ref 0.44–1.00)
GFR, Estimated: 49 mL/min — ABNORMAL LOW (ref >60)
Glucose, Bld: 120 mg/dL — ABNORMAL HIGH (ref 70–99)
Potassium: 4.3 mmol/L (ref 3.5–5.1)
Sodium: 138 mmol/L (ref 135–145)

## 2021-05-02 LAB — CBC
HCT: 46.3 % — ABNORMAL HIGH (ref 36.0–46.0)
Hemoglobin: 14.6 g/dL (ref 12.0–15.0)
MCH: 32.7 pg (ref 26.0–34.0)
MCHC: 31.5 g/dL (ref 30.0–36.0)
MCV: 103.6 fL — ABNORMAL HIGH (ref 80.0–100.0)
Platelets: 221 10*3/uL (ref 150–400)
RBC: 4.47 MIL/uL (ref 3.87–5.11)
RDW: 14.6 % (ref 11.5–15.5)
WBC: 7.8 10*3/uL (ref 4.0–10.5)
nRBC: 0 % (ref 0.0–0.2)

## 2021-05-02 LAB — SURGICAL PCR SCREEN
MRSA, PCR: NEGATIVE
Staphylococcus aureus: NEGATIVE

## 2021-05-02 LAB — TYPE AND SCREEN
ABO/RH(D): A POS
Antibody Screen: NEGATIVE

## 2021-05-02 LAB — GLUCOSE, CAPILLARY: Glucose-Capillary: 134 mg/dL — ABNORMAL HIGH (ref 70–99)

## 2021-05-02 NOTE — Progress Notes (Signed)
PCP - Dr. Kirstie PeriAshish Shah ?Cardiologist - denies ? ?PPM/ICD - denies ? ? ?Chest x-ray - 09/13/15 ?EKG - 05/02/21 ?Stress Test - several years ago, normal per pt ?ECHO - denies ?Cardiac Cath - denies ? ?Sleep Study - 2015, OSA+ ?CPAP - denies ? ?DM- Type 2 ?Fasting Blood Sugar - 90-100 ?Checks Blood Sugar 3-4 times a day ? ?ASA/Blood Thinner Instructions: n/a ? ? ?ERAS Protcol - no, NPO ? ? ?COVID TEST- n/a ? ? ?Anesthesia review: no ? ?Patient denies shortness of breath, fever, cough and chest pain at PAT appointment ? ? ?All instructions explained to the patient, with a verbal understanding of the material. Patient agrees to go over the instructions while at home for a better understanding. Patient also instructed to notify surgeon if any contact with COVID+ person or if she develops any symptoms. The opportunity to ask questions was provided. ?  ?

## 2021-05-04 DIAGNOSIS — E1165 Type 2 diabetes mellitus with hyperglycemia: Secondary | ICD-10-CM | POA: Diagnosis not present

## 2021-05-08 DIAGNOSIS — I10 Essential (primary) hypertension: Secondary | ICD-10-CM | POA: Diagnosis not present

## 2021-05-08 DIAGNOSIS — R209 Unspecified disturbances of skin sensation: Secondary | ICD-10-CM | POA: Diagnosis not present

## 2021-05-08 DIAGNOSIS — Z299 Encounter for prophylactic measures, unspecified: Secondary | ICD-10-CM | POA: Diagnosis not present

## 2021-05-08 DIAGNOSIS — F17210 Nicotine dependence, cigarettes, uncomplicated: Secondary | ICD-10-CM | POA: Diagnosis not present

## 2021-05-08 DIAGNOSIS — Z6834 Body mass index (BMI) 34.0-34.9, adult: Secondary | ICD-10-CM | POA: Diagnosis not present

## 2021-05-08 DIAGNOSIS — M549 Dorsalgia, unspecified: Secondary | ICD-10-CM | POA: Diagnosis not present

## 2021-05-08 DIAGNOSIS — E1165 Type 2 diabetes mellitus with hyperglycemia: Secondary | ICD-10-CM | POA: Diagnosis not present

## 2021-05-08 NOTE — Anesthesia Preprocedure Evaluation (Addendum)
Anesthesia Evaluation  ?Patient identified by MRN, date of birth, ID band ?Patient awake ? ? ? ?Reviewed: ?Allergy & Precautions, NPO status , Patient's Chart, lab work & pertinent test results ? ?History of Anesthesia Complications ?Negative for: history of anesthetic complications ? ?Airway ?Mallampati: II ? ?TM Distance: >3 FB ?Neck ROM: Full ? ? ? Dental ? ?(+) Dental Advisory Given, Poor Dentition ?  ?Pulmonary ?asthma , sleep apnea , Current Smoker,  ?  ?Pulmonary exam normal ? ? ? ? ? ? ? Cardiovascular ?hypertension, Pt. on medications ?Normal cardiovascular exam ? ? ?  ?Neuro/Psych ?PSYCHIATRIC DISORDERS Bipolar Disorder negative neurological ROS ?   ? GI/Hepatic ?Neg liver ROS, GERD  Medicated,  ?Endo/Other  ?diabetes, Type 2, Oral Hypoglycemic Agentsobesity ? Renal/GU ?Renal InsufficiencyRenal disease  ?negative genitourinary ?  ?Musculoskeletal ? ?(+) Arthritis ,  ? Abdominal ?(+) + obese,   ?Peds ?negative pediatric ROS ?(+)  Hematology ?negative hematology ROS ?(+)   ?Anesthesia Other Findings ? ? Reproductive/Obstetrics ?negative OB ROS ? ?  ? ? ? ? ? ? ? ? ? ? ? ? ? ?  ?  ? ? ? ? ? ? ? ?Anesthesia Physical ? ?Anesthesia Plan ? ?ASA: 3 ? ?Anesthesia Plan: General  ? ?Post-op Pain Management: Regional block*, Celebrex PO (pre-op)* and Tylenol PO (pre-op)*  ? ?Induction: Intravenous ? ?PONV Risk Score and Plan: 3 and Ondansetron, Treatment may vary due to age or medical condition, Midazolam and Dexamethasone ? ?Airway Management Planned: Oral ETT ? ?Additional Equipment:  ? ?Intra-op Plan:  ? ?Post-operative Plan: Extubation in OR ? ?Informed Consent: I have reviewed the patients History and Physical, chart, labs and discussed the procedure including the risks, benefits and alternatives for the proposed anesthesia with the patient or authorized representative who has indicated his/her understanding and acceptance.  ? ? ? ?Dental advisory given ? ?Plan Discussed with:  Anesthesiologist and CRNA ? ?Anesthesia Plan Comments:   ? ? ? ? ? ?Anesthesia Quick Evaluation ? ?

## 2021-05-09 ENCOUNTER — Other Ambulatory Visit: Payer: Self-pay

## 2021-05-09 ENCOUNTER — Inpatient Hospital Stay (HOSPITAL_COMMUNITY)
Admission: RE | Admit: 2021-05-09 | Discharge: 2021-05-10 | DRG: 460 | Disposition: A | Discharge status: Home / Self Care | Payer: Medicare PPO | Attending: Neurosurgery | Admitting: Neurosurgery

## 2021-05-09 ENCOUNTER — Inpatient Hospital Stay (HOSPITAL_COMMUNITY)
Admission: RE | Disposition: A | Discharge status: Home / Self Care | Payer: Self-pay | Source: Home / Self Care | Attending: Neurosurgery

## 2021-05-09 ENCOUNTER — Inpatient Hospital Stay (HOSPITAL_COMMUNITY): Payer: Medicare PPO

## 2021-05-09 ENCOUNTER — Inpatient Hospital Stay (HOSPITAL_COMMUNITY): Payer: Medicare PPO | Admitting: Physician Assistant

## 2021-05-09 ENCOUNTER — Encounter (HOSPITAL_COMMUNITY): Payer: Self-pay | Admitting: Neurosurgery

## 2021-05-09 ENCOUNTER — Inpatient Hospital Stay (HOSPITAL_COMMUNITY): Payer: Medicare PPO | Admitting: Anesthesiology

## 2021-05-09 DIAGNOSIS — F17210 Nicotine dependence, cigarettes, uncomplicated: Secondary | ICD-10-CM | POA: Diagnosis present

## 2021-05-09 DIAGNOSIS — Z882 Allergy status to sulfonamides status: Secondary | ICD-10-CM | POA: Diagnosis not present

## 2021-05-09 DIAGNOSIS — J45909 Unspecified asthma, uncomplicated: Secondary | ICD-10-CM | POA: Diagnosis not present

## 2021-05-09 DIAGNOSIS — N183 Chronic kidney disease, stage 3 unspecified: Secondary | ICD-10-CM | POA: Diagnosis present

## 2021-05-09 DIAGNOSIS — M19071 Primary osteoarthritis, right ankle and foot: Secondary | ICD-10-CM | POA: Diagnosis present

## 2021-05-09 DIAGNOSIS — I10 Essential (primary) hypertension: Secondary | ICD-10-CM

## 2021-05-09 DIAGNOSIS — K219 Gastro-esophageal reflux disease without esophagitis: Secondary | ICD-10-CM | POA: Diagnosis present

## 2021-05-09 DIAGNOSIS — M48062 Spinal stenosis, lumbar region with neurogenic claudication: Principal | ICD-10-CM | POA: Diagnosis present

## 2021-05-09 DIAGNOSIS — M19041 Primary osteoarthritis, right hand: Secondary | ICD-10-CM | POA: Diagnosis present

## 2021-05-09 DIAGNOSIS — I129 Hypertensive chronic kidney disease with stage 1 through stage 4 chronic kidney disease, or unspecified chronic kidney disease: Secondary | ICD-10-CM | POA: Diagnosis not present

## 2021-05-09 DIAGNOSIS — K589 Irritable bowel syndrome without diarrhea: Secondary | ICD-10-CM | POA: Diagnosis present

## 2021-05-09 DIAGNOSIS — M19042 Primary osteoarthritis, left hand: Secondary | ICD-10-CM | POA: Diagnosis present

## 2021-05-09 DIAGNOSIS — Z794 Long term (current) use of insulin: Secondary | ICD-10-CM

## 2021-05-09 DIAGNOSIS — Y92234 Operating room of hospital as the place of occurrence of the external cause: Secondary | ICD-10-CM | POA: Diagnosis not present

## 2021-05-09 DIAGNOSIS — G4730 Sleep apnea, unspecified: Secondary | ICD-10-CM | POA: Diagnosis present

## 2021-05-09 DIAGNOSIS — E119 Type 2 diabetes mellitus without complications: Secondary | ICD-10-CM

## 2021-05-09 DIAGNOSIS — M069 Rheumatoid arthritis, unspecified: Secondary | ICD-10-CM | POA: Diagnosis not present

## 2021-05-09 DIAGNOSIS — E785 Hyperlipidemia, unspecified: Secondary | ICD-10-CM | POA: Diagnosis not present

## 2021-05-09 DIAGNOSIS — E1151 Type 2 diabetes mellitus with diabetic peripheral angiopathy without gangrene: Secondary | ICD-10-CM | POA: Diagnosis not present

## 2021-05-09 DIAGNOSIS — M79605 Pain in left leg: Secondary | ICD-10-CM | POA: Diagnosis present

## 2021-05-09 DIAGNOSIS — M5136 Other intervertebral disc degeneration, lumbar region: Secondary | ICD-10-CM | POA: Diagnosis present

## 2021-05-09 DIAGNOSIS — Z7984 Long term (current) use of oral hypoglycemic drugs: Secondary | ICD-10-CM

## 2021-05-09 DIAGNOSIS — Z79899 Other long term (current) drug therapy: Secondary | ICD-10-CM | POA: Diagnosis not present

## 2021-05-09 DIAGNOSIS — M19072 Primary osteoarthritis, left ankle and foot: Secondary | ICD-10-CM | POA: Diagnosis present

## 2021-05-09 DIAGNOSIS — F319 Bipolar disorder, unspecified: Secondary | ICD-10-CM | POA: Diagnosis present

## 2021-05-09 DIAGNOSIS — M170 Bilateral primary osteoarthritis of knee: Secondary | ICD-10-CM | POA: Diagnosis present

## 2021-05-09 DIAGNOSIS — M4326 Fusion of spine, lumbar region: Secondary | ICD-10-CM | POA: Diagnosis not present

## 2021-05-09 DIAGNOSIS — Z888 Allergy status to other drugs, medicaments and biological substances status: Secondary | ICD-10-CM

## 2021-05-09 DIAGNOSIS — F988 Other specified behavioral and emotional disorders with onset usually occurring in childhood and adolescence: Secondary | ICD-10-CM | POA: Diagnosis present

## 2021-05-09 DIAGNOSIS — M48061 Spinal stenosis, lumbar region without neurogenic claudication: Secondary | ICD-10-CM | POA: Diagnosis not present

## 2021-05-09 DIAGNOSIS — Y838 Other surgical procedures as the cause of abnormal reaction of the patient, or of later complication, without mention of misadventure at the time of the procedure: Secondary | ICD-10-CM | POA: Diagnosis not present

## 2021-05-09 DIAGNOSIS — Z981 Arthrodesis status: Secondary | ICD-10-CM | POA: Diagnosis not present

## 2021-05-09 DIAGNOSIS — Z885 Allergy status to narcotic agent status: Secondary | ICD-10-CM

## 2021-05-09 DIAGNOSIS — M5416 Radiculopathy, lumbar region: Secondary | ICD-10-CM | POA: Diagnosis not present

## 2021-05-09 DIAGNOSIS — M96820 Accidental puncture and laceration of a musculoskeletal structure during a musculoskeletal system procedure: Secondary | ICD-10-CM | POA: Diagnosis not present

## 2021-05-09 DIAGNOSIS — E1140 Type 2 diabetes mellitus with diabetic neuropathy, unspecified: Secondary | ICD-10-CM | POA: Diagnosis not present

## 2021-05-09 HISTORY — PX: BACK SURGERY: SHX140

## 2021-05-09 LAB — GLUCOSE, CAPILLARY
Glucose-Capillary: 112 mg/dL — ABNORMAL HIGH (ref 70–99)
Glucose-Capillary: 184 mg/dL — ABNORMAL HIGH (ref 70–99)
Glucose-Capillary: 171 mg/dL — ABNORMAL HIGH (ref 70–99)
Glucose-Capillary: 189 mg/dL — ABNORMAL HIGH (ref 70–99)

## 2021-05-09 SURGERY — POSTERIOR LUMBAR FUSION 1 LEVEL
Anesthesia: General

## 2021-05-09 MED ORDER — BISACODYL 10 MG RE SUPP: 10.0000 mg | Freq: Every day | RECTAL | Status: DC | PRN

## 2021-05-09 MED ORDER — ONDANSETRON HCL 4 MG PO TABS: 4.0000 mg | ORAL_TABLET | Freq: Four times a day (QID) | ORAL | Status: DC | PRN

## 2021-05-09 MED ORDER — PANTOPRAZOLE SODIUM 40 MG PO TBEC
40.0000 mg | DELAYED_RELEASE_TABLET | Freq: Two times a day (BID) | ORAL | Status: DC
  Administered 2021-05-09 – 2021-05-10 (×2): 40 mg via ORAL
  Filled 2021-05-09 (×2): qty 1

## 2021-05-09 MED ORDER — INSULIN ASPART 100 UNIT/ML FLEXPEN: 0.0000 [IU] | PEN_INJECTOR | Freq: Three times a day (TID) | SUBCUTANEOUS | Status: DC

## 2021-05-09 MED ORDER — PHENYLEPHRINE HCL-NACL 20-0.9 MG/250ML-% IV SOLN
INTRAVENOUS | Status: DC | PRN
  Administered 2021-05-09: 50 ug/min via INTRAVENOUS

## 2021-05-09 MED ORDER — METHOCARBAMOL 500 MG PO TABS
500.0000 mg | ORAL_TABLET | Freq: Four times a day (QID) | ORAL | Status: DC | PRN
  Administered 2021-05-09 – 2021-05-10 (×3): 500 mg via ORAL
  Filled 2021-05-09 (×3): qty 1

## 2021-05-09 MED ORDER — FENTANYL CITRATE (PF) 250 MCG/5ML IJ SOLN
INTRAMUSCULAR | Status: AC
  Filled 2021-05-09: qty 5

## 2021-05-09 MED ORDER — BUPIVACAINE HCL (PF) 0.5 % IJ SOLN
INTRAMUSCULAR | Status: DC | PRN
  Administered 2021-05-09: 5 mL

## 2021-05-09 MED ORDER — HYDROMORPHONE HCL 1 MG/ML IJ SOLN
INTRAMUSCULAR | Status: AC
  Filled 2021-05-09: qty 1

## 2021-05-09 MED ORDER — INSULIN ASPART 100 UNIT/ML IJ SOLN
0.0000 [IU] | Freq: Three times a day (TID) | INTRAMUSCULAR | Status: DC
  Administered 2021-05-09: 4 [IU] via SUBCUTANEOUS
  Administered 2021-05-10: 3 [IU] via SUBCUTANEOUS

## 2021-05-09 MED ORDER — SODIUM CHLORIDE 0.9% FLUSH: 3.0000 mL | INTRAVENOUS | Status: DC | PRN

## 2021-05-09 MED ORDER — FENTANYL CITRATE (PF) 100 MCG/2ML IJ SOLN
INTRAMUSCULAR | Status: AC
  Filled 2021-05-09: qty 2

## 2021-05-09 MED ORDER — MORPHINE SULFATE (PF) 2 MG/ML IV SOLN: 2.0000 mg | INTRAVENOUS | Status: DC | PRN

## 2021-05-09 MED ORDER — PHENYLEPHRINE HCL (PRESSORS) 10 MG/ML IV SOLN
INTRAVENOUS | Status: AC
  Filled 2021-05-09: qty 1

## 2021-05-09 MED ORDER — METHOCARBAMOL 1000 MG/10ML IJ SOLN
500.0000 mg | Freq: Four times a day (QID) | INTRAVENOUS | Status: DC | PRN
  Filled 2021-05-09: qty 5

## 2021-05-09 MED ORDER — CHLORHEXIDINE GLUCONATE 0.12 % MT SOLN
15.0000 mL | Freq: Once | OROMUCOSAL | Status: AC
  Administered 2021-05-09: 15 mL via OROMUCOSAL
  Filled 2021-05-09: qty 15

## 2021-05-09 MED ORDER — FENTANYL CITRATE (PF) 100 MCG/2ML IJ SOLN
INTRAMUSCULAR | Status: DC | PRN
Start: 2021-05-09 — End: 2021-05-09
  Administered 2021-05-09: 25 ug via INTRAVENOUS
  Administered 2021-05-09: 100 ug via INTRAVENOUS
  Administered 2021-05-09: 50 ug via INTRAVENOUS
  Administered 2021-05-09 (×3): 25 ug via INTRAVENOUS

## 2021-05-09 MED ORDER — PHENYLEPHRINE 40 MCG/ML (10ML) SYRINGE FOR IV PUSH (FOR BLOOD PRESSURE SUPPORT)
PREFILLED_SYRINGE | INTRAVENOUS | Status: DC | PRN
  Administered 2021-05-09 (×2): 80 ug via INTRAVENOUS

## 2021-05-09 MED ORDER — SUGAMMADEX SODIUM 200 MG/2ML IV SOLN
INTRAVENOUS | Status: DC | PRN
  Administered 2021-05-09: 200 mg via INTRAVENOUS

## 2021-05-09 MED ORDER — ATOMOXETINE HCL 60 MG PO CAPS
60.0000 mg | ORAL_CAPSULE | Freq: Every day | ORAL | Status: DC
  Filled 2021-05-09: qty 1

## 2021-05-09 MED ORDER — SEMAGLUTIDE (1 MG/DOSE) 4 MG/3ML ~~LOC~~ SOPN: 1.0000 mg | PEN_INJECTOR | SUBCUTANEOUS | Status: DC

## 2021-05-09 MED ORDER — DEXAMETHASONE SODIUM PHOSPHATE 4 MG/ML IJ SOLN
INTRAMUSCULAR | Status: DC | PRN
Start: 2021-05-09 — End: 2021-05-09
  Administered 2021-05-09: 5 mg via INTRAVENOUS

## 2021-05-09 MED ORDER — MIDAZOLAM HCL 2 MG/2ML IJ SOLN
INTRAMUSCULAR | Status: AC
  Filled 2021-05-09: qty 2

## 2021-05-09 MED ORDER — HEMOSTATIC AGENTS (NO CHARGE) OPTIME
TOPICAL | Status: DC | PRN
  Administered 2021-05-09: 1 via TOPICAL

## 2021-05-09 MED ORDER — FENTANYL CITRATE (PF) 100 MCG/2ML IJ SOLN
25.0000 ug | INTRAMUSCULAR | Status: DC | PRN
  Administered 2021-05-09 (×2): 25 ug via INTRAVENOUS
  Administered 2021-05-09: 50 ug via INTRAVENOUS

## 2021-05-09 MED ORDER — LISINOPRIL 10 MG PO TABS
5.0000 mg | ORAL_TABLET | Freq: Every day | ORAL | Status: DC
  Administered 2021-05-09: 5 mg via ORAL
  Filled 2021-05-09: qty 1

## 2021-05-09 MED ORDER — AMOXICILLIN-POT CLAVULANATE 500-125 MG PO TABS: 1.0000 | ORAL_TABLET | Freq: Two times a day (BID) | ORAL | Status: DC

## 2021-05-09 MED ORDER — LIDOCAINE-EPINEPHRINE 1 %-1:100000 IJ SOLN
INTRAMUSCULAR | Status: DC | PRN
  Administered 2021-05-09: 5 mL

## 2021-05-09 MED ORDER — CHLORHEXIDINE GLUCONATE CLOTH 2 % EX PADS: 6.0000 | MEDICATED_PAD | Freq: Once | CUTANEOUS | Status: DC

## 2021-05-09 MED ORDER — PROPOFOL 10 MG/ML IV BOLUS
INTRAVENOUS | Status: DC | PRN
  Administered 2021-05-09: 170 mg via INTRAVENOUS

## 2021-05-09 MED ORDER — ONDANSETRON HCL 4 MG/2ML IJ SOLN
INTRAMUSCULAR | Status: DC | PRN
  Administered 2021-05-09: 4 mg via INTRAVENOUS

## 2021-05-09 MED ORDER — SODIUM CHLORIDE 0.9 % IV SOLN: INTRAVENOUS | Status: DC

## 2021-05-09 MED ORDER — SULFASALAZINE 500 MG PO TABS
500.0000 mg | ORAL_TABLET | Freq: Two times a day (BID) | ORAL | Status: DC
  Filled 2021-05-09 (×3): qty 1

## 2021-05-09 MED ORDER — ONDANSETRON HCL 4 MG/2ML IJ SOLN: 4.0000 mg | Freq: Four times a day (QID) | INTRAMUSCULAR | Status: DC | PRN

## 2021-05-09 MED ORDER — DOCUSATE SODIUM 100 MG PO CAPS
100.0000 mg | ORAL_CAPSULE | Freq: Two times a day (BID) | ORAL | Status: DC
  Administered 2021-05-09: 100 mg via ORAL
  Filled 2021-05-09: qty 1

## 2021-05-09 MED ORDER — LIDOCAINE-EPINEPHRINE 1 %-1:100000 IJ SOLN
INTRAMUSCULAR | Status: AC
  Filled 2021-05-09: qty 1

## 2021-05-09 MED ORDER — AMISULPRIDE (ANTIEMETIC) 5 MG/2ML IV SOLN: 10.0000 mg | Freq: Once | INTRAVENOUS | Status: DC | PRN

## 2021-05-09 MED ORDER — ROCURONIUM BROMIDE 10 MG/ML (PF) SYRINGE
PREFILLED_SYRINGE | INTRAVENOUS | Status: DC | PRN
  Administered 2021-05-09: 30 mg via INTRAVENOUS
  Administered 2021-05-09: 10 mg via INTRAVENOUS
  Administered 2021-05-09: 100 mg via INTRAVENOUS

## 2021-05-09 MED ORDER — INSULIN ASPART 100 UNIT/ML IJ SOLN: 0.0000 [IU] | INTRAMUSCULAR | Status: DC | PRN

## 2021-05-09 MED ORDER — HYDROMORPHONE HCL 1 MG/ML IJ SOLN
0.2500 mg | INTRAMUSCULAR | Status: DC | PRN
  Administered 2021-05-09 (×2): 0.5 mg via INTRAVENOUS

## 2021-05-09 MED ORDER — ORAL CARE MOUTH RINSE: 15.0000 mL | Freq: Once | OROMUCOSAL | Status: AC

## 2021-05-09 MED ORDER — CEFAZOLIN SODIUM-DEXTROSE 2-4 GM/100ML-% IV SOLN
2.0000 g | INTRAVENOUS | Status: AC
  Administered 2021-05-09 (×2): 2 g via INTRAVENOUS
  Filled 2021-05-09: qty 100

## 2021-05-09 MED ORDER — LACTATED RINGERS IV SOLN
INTRAVENOUS | Status: DC
Start: 2021-05-09 — End: 2021-05-09

## 2021-05-09 MED ORDER — PHENOL 1.4 % MT LIQD: 1.0000 | OROMUCOSAL | Status: DC | PRN

## 2021-05-09 MED ORDER — OXYCODONE HCL 5 MG PO TABS
10.0000 mg | ORAL_TABLET | ORAL | Status: DC | PRN
  Administered 2021-05-09 – 2021-05-10 (×6): 10 mg via ORAL
  Filled 2021-05-09 (×6): qty 2

## 2021-05-09 MED ORDER — ACETAMINOPHEN 650 MG RE SUPP: 650.0000 mg | RECTAL | Status: DC | PRN

## 2021-05-09 MED ORDER — PROPOFOL 10 MG/ML IV BOLUS
INTRAVENOUS | Status: AC
  Filled 2021-05-09: qty 20

## 2021-05-09 MED ORDER — SODIUM CHLORIDE 0.9 % IV SOLN: 250.0000 mL | INTRAVENOUS | Status: DC

## 2021-05-09 MED ORDER — 0.9 % SODIUM CHLORIDE (POUR BTL) OPTIME
TOPICAL | Status: DC | PRN
  Administered 2021-05-09: 1000 mL

## 2021-05-09 MED ORDER — THROMBIN 5000 UNITS EX SOLR
OROMUCOSAL | Status: DC | PRN
  Administered 2021-05-09 (×2): 5 mL via TOPICAL

## 2021-05-09 MED ORDER — BUPROPION HCL ER (XL) 300 MG PO TB24: 300.0000 mg | ORAL_TABLET | Freq: Every day | ORAL | Status: DC

## 2021-05-09 MED ORDER — GLIMEPIRIDE 2 MG PO TABS
2.0000 mg | ORAL_TABLET | Freq: Every day | ORAL | Status: DC
  Administered 2021-05-10: 2 mg via ORAL
  Filled 2021-05-09: qty 1

## 2021-05-09 MED ORDER — BUPIVACAINE HCL (PF) 0.5 % IJ SOLN
INTRAMUSCULAR | Status: AC
  Filled 2021-05-09: qty 30

## 2021-05-09 MED ORDER — DILTIAZEM HCL ER COATED BEADS 180 MG PO CP24
180.0000 mg | ORAL_CAPSULE | Freq: Every day | ORAL | Status: DC
  Filled 2021-05-09: qty 1

## 2021-05-09 MED ORDER — DEXMEDETOMIDINE (PRECEDEX) IN NS 20 MCG/5ML (4 MCG/ML) IV SYRINGE
PREFILLED_SYRINGE | INTRAVENOUS | Status: DC | PRN
  Administered 2021-05-09: 8 ug via INTRAVENOUS

## 2021-05-09 MED ORDER — SENNA 8.6 MG PO TABS
1.0000 | ORAL_TABLET | Freq: Two times a day (BID) | ORAL | Status: DC
  Administered 2021-05-09: 8.6 mg via ORAL
  Filled 2021-05-09: qty 1

## 2021-05-09 MED ORDER — OXYCODONE HCL 5 MG PO TABS: 5.0000 mg | ORAL_TABLET | ORAL | Status: DC | PRN

## 2021-05-09 MED ORDER — INSULIN GLARGINE-YFGN 100 UNIT/ML ~~LOC~~ SOLN
48.0000 [IU] | Freq: Every day | SUBCUTANEOUS | Status: DC
  Filled 2021-05-09: qty 0.48

## 2021-05-09 MED ORDER — SODIUM CHLORIDE 0.9% FLUSH
3.0000 mL | Freq: Two times a day (BID) | INTRAVENOUS | Status: DC
  Administered 2021-05-09 (×2): 3 mL via INTRAVENOUS

## 2021-05-09 MED ORDER — ALPRAZOLAM 0.5 MG PO TABS: 0.5000 mg | ORAL_TABLET | Freq: Three times a day (TID) | ORAL | Status: DC | PRN

## 2021-05-09 MED ORDER — ACETAMINOPHEN 500 MG PO TABS
1000.0000 mg | ORAL_TABLET | Freq: Once | ORAL | Status: AC
  Administered 2021-05-09: 1000 mg via ORAL
  Filled 2021-05-09: qty 2

## 2021-05-09 MED ORDER — THROMBIN 5000 UNITS EX SOLR
CUTANEOUS | Status: AC
  Filled 2021-05-09: qty 5000

## 2021-05-09 MED ORDER — MENTHOL 3 MG MT LOZG: 1.0000 | LOZENGE | OROMUCOSAL | Status: DC | PRN

## 2021-05-09 MED ORDER — LIDOCAINE 2% (20 MG/ML) 5 ML SYRINGE
INTRAMUSCULAR | Status: DC | PRN
  Administered 2021-05-09: 100 mg via INTRAVENOUS

## 2021-05-09 MED ORDER — GABAPENTIN 100 MG PO CAPS
100.0000 mg | ORAL_CAPSULE | Freq: Three times a day (TID) | ORAL | Status: DC
  Administered 2021-05-09 (×2): 100 mg via ORAL
  Filled 2021-05-09 (×2): qty 1

## 2021-05-09 MED ORDER — CELECOXIB 200 MG PO CAPS
200.0000 mg | ORAL_CAPSULE | Freq: Once | ORAL | Status: DC
  Filled 2021-05-09: qty 1

## 2021-05-09 MED ORDER — ACETAMINOPHEN 325 MG PO TABS
650.0000 mg | ORAL_TABLET | ORAL | Status: DC | PRN
  Administered 2021-05-09 – 2021-05-10 (×2): 650 mg via ORAL
  Filled 2021-05-09 (×2): qty 2

## 2021-05-09 MED ORDER — TOPIRAMATE 100 MG PO TABS
200.0000 mg | ORAL_TABLET | Freq: Two times a day (BID) | ORAL | Status: DC
  Administered 2021-05-09: 200 mg via ORAL
  Filled 2021-05-09: qty 2

## 2021-05-09 MED ORDER — MIDAZOLAM HCL 5 MG/5ML IJ SOLN
INTRAMUSCULAR | Status: DC | PRN
  Administered 2021-05-09: 2 mg via INTRAVENOUS

## 2021-05-09 SURGICAL SUPPLY — 73 items
BAG COUNTER SPONGE SURGICOUNT (BAG) ×3 IMPLANT
BASKET BONE COLLECTION (BASKET) ×2 IMPLANT
BENZOIN TINCTURE PRP APPL 2/3 (GAUZE/BANDAGES/DRESSINGS) IMPLANT
BLADE CLIPPER SURG (BLADE) IMPLANT
BLADE SURG 11 STRL SS (BLADE) ×2 IMPLANT
BUR MATCHSTICK NEURO 3.0 LAGG (BURR) ×2 IMPLANT
BUR PRECISION FLUTE 5.0 (BURR) ×2 IMPLANT
CAGE INTERBODY PL SHT 7X22.5 (Plate) ×2 IMPLANT
CANISTER SUCT 3000ML PPV (MISCELLANEOUS) ×2 IMPLANT
CARTRIDGE OIL MAESTRO DRILL (MISCELLANEOUS) ×1 IMPLANT
CNTNR URN SCR LID CUP LEK RST (MISCELLANEOUS) ×1 IMPLANT
CONT SPEC 4OZ STRL OR WHT (MISCELLANEOUS) ×2
COVER BACK TABLE 60X90IN (DRAPES) ×2 IMPLANT
DECANTER SPIKE VIAL GLASS SM (MISCELLANEOUS) ×1 IMPLANT
DERMABOND ADHESIVE PROPEN (GAUZE/BANDAGES/DRESSINGS) ×1
DERMABOND ADVANCED (GAUZE/BANDAGES/DRESSINGS) ×1
DERMABOND ADVANCED .7 DNX12 (GAUZE/BANDAGES/DRESSINGS) ×1 IMPLANT
DERMABOND ADVANCED .7 DNX6 (GAUZE/BANDAGES/DRESSINGS) IMPLANT
DIFFUSER DRILL AIR PNEUMATIC (MISCELLANEOUS) ×2 IMPLANT
DRAPE C-ARM 42X72 X-RAY (DRAPES) ×2 IMPLANT
DRAPE C-ARMOR (DRAPES) ×2 IMPLANT
DRAPE LAPAROTOMY 100X72X124 (DRAPES) ×2 IMPLANT
DRAPE SURG 17X23 STRL (DRAPES) ×2 IMPLANT
DRSG OPSITE POSTOP 4X6 (GAUZE/BANDAGES/DRESSINGS) ×1 IMPLANT
DURAPREP 26ML APPLICATOR (WOUND CARE) ×2 IMPLANT
ELECT REM PT RETURN 9FT ADLT (ELECTROSURGICAL) ×2
ELECTRODE REM PT RTRN 9FT ADLT (ELECTROSURGICAL) ×1 IMPLANT
GAUZE 4X4 16PLY ~~LOC~~+RFID DBL (SPONGE) ×1 IMPLANT
GAUZE SPONGE 4X4 12PLY STRL (GAUZE/BANDAGES/DRESSINGS) IMPLANT
GLOVE EXAM NITRILE XL STR (GLOVE) IMPLANT
GLOVE SURG ENC MOIS LTX SZ7.5 (GLOVE) IMPLANT
GLOVE SURG LTX SZ7 (GLOVE) ×4 IMPLANT
GLOVE SURG UNDER POLY LF SZ7.5 (GLOVE) ×4 IMPLANT
GOWN STRL REUS W/ TWL LRG LVL3 (GOWN DISPOSABLE) ×4 IMPLANT
GOWN STRL REUS W/ TWL XL LVL3 (GOWN DISPOSABLE) IMPLANT
GOWN STRL REUS W/TWL 2XL LVL3 (GOWN DISPOSABLE) IMPLANT
GOWN STRL REUS W/TWL LRG LVL3 (GOWN DISPOSABLE) ×8
GOWN STRL REUS W/TWL XL LVL3 (GOWN DISPOSABLE)
GRAFT BONE PROTEIOS XS 0.5CC (Orthopedic Implant) ×1 IMPLANT
HEMOSTAT POWDER KIT SURGIFOAM (HEMOSTASIS) ×3 IMPLANT
KIT BASIN OR (CUSTOM PROCEDURE TRAY) ×2 IMPLANT
KIT POSITION SURG JACKSON T1 (MISCELLANEOUS) ×2 IMPLANT
KIT TURNOVER KIT B (KITS) ×2 IMPLANT
MILL MEDIUM DISP (BLADE) ×2 IMPLANT
NDL HYPO 18GX1.5 BLUNT FILL (NEEDLE) IMPLANT
NDL SPNL 18GX3.5 QUINCKE PK (NEEDLE) IMPLANT
NEEDLE HYPO 18GX1.5 BLUNT FILL (NEEDLE) IMPLANT
NEEDLE HYPO 22GX1.5 SAFETY (NEEDLE) ×2 IMPLANT
NEEDLE SPNL 18GX3.5 QUINCKE PK (NEEDLE) ×2 IMPLANT
NS IRRIG 1000ML POUR BTL (IV SOLUTION) ×2 IMPLANT
OIL CARTRIDGE MAESTRO DRILL (MISCELLANEOUS) ×2
PACK LAMINECTOMY NEURO (CUSTOM PROCEDURE TRAY) ×2 IMPLANT
PAD ARMBOARD 7.5X6 YLW CONV (MISCELLANEOUS) ×6 IMPLANT
PATTIES SURGICAL .5 X.5 (GAUZE/BANDAGES/DRESSINGS) ×1 IMPLANT
PUTTY GRAFTON DBF 6CC W/DELIVE (Putty) ×1 IMPLANT
ROD 50MM (Rod) ×4 IMPLANT
ROD SPNL CVD 50X4.75X (Rod) IMPLANT
SCREW MA THRD TI 5.5X45 (Screw) ×2 IMPLANT
SCREW SET SOLERA (Screw) ×12 IMPLANT
SCREW SET SOLERA TI (Screw) IMPLANT
SEALANT ADHERUS EXTEND TIP (MISCELLANEOUS) ×1 IMPLANT
SPONGE SURGIFOAM ABS GEL 100 (HEMOSTASIS) IMPLANT
SPONGE T-LAP 4X18 ~~LOC~~+RFID (SPONGE) ×2 IMPLANT
STRIP CLOSURE SKIN 1/2X4 (GAUZE/BANDAGES/DRESSINGS) IMPLANT
SUT PROLENE 6 0 BV (SUTURE) ×2 IMPLANT
SUT VIC AB 0 CT1 18XCR BRD8 (SUTURE) ×1 IMPLANT
SUT VIC AB 0 CT1 8-18 (SUTURE) ×4
SUT VICRYL 3-0 RB1 18 ABS (SUTURE) ×3 IMPLANT
SYR 3ML LL SCALE MARK (SYRINGE) ×6 IMPLANT
TOWEL GREEN STERILE (TOWEL DISPOSABLE) ×2 IMPLANT
TOWEL GREEN STERILE FF (TOWEL DISPOSABLE) ×2 IMPLANT
TRAY FOLEY MTR SLVR 16FR STAT (SET/KITS/TRAYS/PACK) ×2 IMPLANT
WATER STERILE IRR 1000ML POUR (IV SOLUTION) ×2 IMPLANT

## 2021-05-09 NOTE — Transfer of Care (Signed)
Immediate Anesthesia Transfer of Care Note ? ?Patient: Margaret [name] ? ?Procedure(s) Performed: Posterior Lumbar Interbody Fusion Lumbar Three-Four; EXPLORE FUSION; EXTENSION OF FUSION Lumbar Three-Lumbar Five ? ?Patient Location: PACU ? ?Anesthesia Type:General ? ?Level of Consciousness: awake and alert  ? ?Airway & Oxygen Therapy: Patient Spontanous Breathing and Patient connected to face mask oxygen ? ?Post-op Assessment: Report given to RN and Post -op Vital signs reviewed and stable ? ?Post vital signs: Reviewed and stable ? ?Last Vitals:  ?Vitals Value Taken Time  ?BP 153/64 05/09/21 1216  ?Temp 36.8 ?C 05/09/21 1216  ?Pulse 90 05/09/21 1218  ?Resp 22 05/09/21 1218  ?SpO2 100 % 05/09/21 1218  ?Vitals shown include unvalidated device data. ? ?Last Pain:  ?Vitals:  ? 05/09/21 0625  ?TempSrc:   ?PainSc: 0-No pain  ?   ? ?  ? ?Complications: No notable events documented. ?

## 2021-05-09 NOTE — Op Note (Signed)
?NEUROSURGERY OPERATIVE NOTE  ? ?PREOP DIAGNOSIS:  ?1. Adjacent level stenosis, L3-4 ? ?POSTOP DIAGNOSIS: Same ? ?PROCEDURE: ?1. L3 laminectomy with facetectomy for decompression of exiting nerve roots, more than would be required for placement of interbody graft ?2. Placement of anterior interbody device - Medtronic Rise 20mm expandable cage x2 ?3. Posterior segmental instrumentation using cortical pedicle screws at L3 - L5 ?4. Interbody arthrodesis, L3-4 ?5. Exploration of fusion, L4-5 ?6. Removal of previous instrumentation - set screws and rod ?7. Use of locally harvested bone autograft ?8. Use of non-structural bone allograft - ProteiOs, DBM ? ?SURGEON: Dr. Consuella Lose, MD ? ?ASSISTANT: Dr. Ashok Pall, MD ? ?ANESTHESIA: General Endotracheal ? ?EBL: 400cc ? ?SPECIMENS: None ? ?DRAINS: None ? ?COMPLICATIONS: None immediate ? ?CONDITION: Hemodynamically stable to PACU ? ?HISTORY: ?Margaret [name] is a 71 y.o. female who has been followed in the outpatient clinic with previous L4-5 decompression and fusion about 6 years ago.  Patient has done very well for several years however presented back to the office with recurrence of back and right greater than left leg pain.  She attempted conservative treatment including medical and injection therapy without significant improvement.  Her imaging did reveal significant adjacent level stenosis at L3-4 and she elected to proceed with surgical decompression and extension of fusion.  The risks, benefits, and alternatives to surgery were all reviewed in detail with the patient.  After all questions were answered, informed consent was obtained and witnessed. ? ?PROCEDURE IN DETAIL: ?The patient was brought to the operating room via stretcher. After induction of general anesthesia, the patient was positioned on the operative table in the prone position. All pressure points were meticulously padded.  Previous incision was then marked out and prepped and draped in the usual  sterile fashion. ? ?After timeout was conducted, skin was infiltrated with local anesthetic. Skin incision was then made sharply and extended superiorly and Bovie electrocautery was used to dissect the subcutaneous tissue until the lumbodorsal fascia was identified and incised. The muscle was then elevated in the subperiosteal plane and the L3 spinous process and lamina was identified.  I was unable to identify the previously placed L4 screws.  These were dissected free and followed inferiorly to identify the intervening rod and L5 screws.  Self-retaining retractors were then placed. ? ?At this point attention was turned to decompression. Complete L3 laminectomy was completed with a high-speed drill and Kerrison punches.  Identified normal dura underneath the L3 lamina.  The pars interarticularis was then identified bilaterally and the pars was cut with a high-speed drill on the right side.  This allowed removal of the inferior to healing process of L3.  Similarly, on the left side the pars was cut.  During dissection on the lateral edge of the thecal sac, there was a significant amount of epidural fibrosis likely from the prior surgery.  During removal of the lateral portion of the ligamentum flavum, a durotomy was created on the dorsal lateral aspect of the dura measuring approximately 1 cm.  This was covered with a small cottonoid and addressed after decompression.  The intra-articular process of L3 was removed on the left side.  Using Kerrison punches, the medial aspect of the superior articulating process of L4 was removed.  The superior aspect of the SAP was also removed.  The L3-4 foramen was then completely decompressed.  The ventral epidural space was dissected.  Once this was done I was able to freely pass a ball dissector along the  L3 and L4 nerve roots indicating good decompression. ? ?At this point, the durotomy on the dorsal lateral surface on the left side was closed with 4 interrupted 6-0 Prolene  stitches.  No further CSF egress was noted. ? ?Disc space was then identified, incised bilaterally, and using a combination of shavers, curettes and rongeurs, complete discectomy was completed. Endplates were prepared with rasps, and bone harvested during decompression was mixed with proteiOs, DBM, and autograft and packed into the interspace. A 51mm cage was tapped into place bilaterally. cages were expanded to achieve good endplate apposition.  They were then backfilled with autograft.Kermit Balo position was confirmed with fluoroscopy. ? ?At this point, the entry points for bilateral L3 cortical pedicle screws were identified using standard anatomic landmarks and lateral fluoro. Pilot holes were then drilled and tapped to 5.5 x 45 mm. Screws were then placed in L3 measuring 5.5 x 45 mm.at this point the previous set screws in L4 and L5 were removed.  The intervening rod was removed.  Soft tissue was cleaned off.  A Leksell was used to check the L4-5 fusion.  I did not notice any movement at the L4-5 level suggesting good fusion.  The L3-L4 and L5 screws appear to be collinear and I therefore elected to simply place a new 50 mm lordotic rod rather than removing the L5 screws.  Set screws were placed and final tightened. Final AP and lateral fluoroscopic images confirmed good position. ? ?Hemostasis was secured and confirmed with bipolar cautery and morcellized gelfoam with thrombin. The wound was then irrigated with copious amounts of antibiotic saline, and the durotomy was covered with polyethylene glycol dural sealant.  Wound was then closed in standard fashion using a combination of interrupted 0 and 3-0 Vicryl stitches in the muscular, fascial, and subcutaneous layers. Skin was then closed using standard Dermabond. Sterile dressing was then applied. The patient was then transferred to the stretcher, extubated, and taken to the postanesthesia care unit in stable hemodynamic condition. ? ?At the end of the case all  sponge, needle, cottonoid, and instrument counts were correct. ? ? ?Consuella Lose, MD ?[facility]  ? ?

## 2021-05-09 NOTE — Progress Notes (Signed)
Orthopedic Tech Progress Note ?Patient Details:  ?Margaret MayhewSarah [name]  ?06/21/1950 ?[id number] ? ?PACU RN called requesting a LSO BRACE  ? ?Ortho Devices ?Type of Ortho Device: Lumbar corsett ?Ortho Device/Splint Location: BACK ?Ortho Device/Splint Interventions: Ordered ?  ?Post Interventions ?Patient Tolerated: Well ?Instructions Provided: Care of device ? ?Margaret PoreSade L [name] ?05/09/2021, 1:40 PM ? ?

## 2021-05-09 NOTE — Anesthesia Postprocedure Evaluation (Signed)
Anesthesia Post Note ? ?Patient: Margaret [name] ? ?Procedure(s) Performed: Posterior Lumbar Interbody Fusion Lumbar Three-Four; EXPLORE FUSION; EXTENSION OF FUSION Lumbar Three-Lumbar Five ? ?  ? ?Patient location during evaluation: PACU ?Anesthesia Type: General ?Level of consciousness: sedated ?Pain management: pain level controlled ?Vital Signs Assessment: post-procedure vital signs reviewed and stable ?Respiratory status: spontaneous breathing and respiratory function stable ?Cardiovascular status: stable ?Postop Assessment: no apparent nausea or vomiting ?Anesthetic complications: no ? ? ?No notable events documented. ? ?Last Vitals:  ?Vitals:  ? 05/09/21 1355 05/09/21 1424  ?BP: (!) 123/57 135/62  ?Pulse: 97 98  ?Resp: 17 17  ?Temp: 36.6 ?C 36.4 ?C  ?SpO2: 98% 95%  ?  ?Last Pain:  ?Vitals:  ? 05/09/21 1424  ?TempSrc: Oral  ?PainSc:   ? ?Pain Goal:   ? ?  ?LLE Sensation: Full sensation (05/09/21 1355) ?  ?RLE Sensation: Full sensation (05/09/21 1355) ?  ?  ?  ? ?Kinnley Paulson DANIEL ? ? ? ? ?

## 2021-05-09 NOTE — Anesthesia Procedure Notes (Signed)
Procedure Name: Intubation ?Date/Time: 05/09/2021 7:57 AM ?Performed by: Lieutenant Diego, CRNA ?Pre-anesthesia Checklist: Patient identified, Emergency Drugs available, Suction available and Patient being monitored ?Patient Re-evaluated:Patient Re-evaluated prior to induction ?Oxygen Delivery Method: Circle system utilized ?Preoxygenation: Pre-oxygenation with 100% oxygen ?Induction Type: IV induction ?Ventilation: Mask ventilation without difficulty ?Laryngoscope Size: Sabra Heck and 2 ?Grade View: Grade I ?Tube type: Oral ?Tube size: 7.0 mm ?Number of attempts: 1 ?Airway Equipment and Method: Stylet and Oral airway ?Placement Confirmation: ETT inserted through vocal cords under direct vision, positive ETCO2 and breath sounds checked- equal and bilateral ?Secured at: 23 cm ?Tube secured with: Tape ?Dental Injury: Teeth and Oropharynx as per pre-operative assessment  ? ? ? ? ?

## 2021-05-09 NOTE — H&P (Signed)
?Chief Complaint  ? ?Back and leg pain ? ?History of Present Illness  ?Margaret [name] is a 71 y.o. female I am seeing in the outpatient neurosurgery clinic.  She underwent L4-5 decompression and fusion approximately 6 years ago.  She did well for several years but over the last several months has noted recurrence of back and right greater than left leg pain.  She has attempted conservative treatments including epidural steroid injections with mild transient relief.  Imaging has revealed adjacent level disease and she therefore presents for surgical decompression and extension of fusion. ? ?Past Medical History  ? ?Past Medical History:  ?Diagnosis Date  ? ADD (attention deficit disorder) 01/05/2016  ? Anemia   ? Arthritis   ? Asthma   ? Benign essential hypertension   ? Bipolar disorder ([place])   ? hospitalized at [facility] for manic episodes  ? Calcaneal spur 01/05/2016  ? Chronic kidney disease   ? stage 3 per pt  ? DDD (degenerative disc disease), lumbar 01/05/2016  ? Diabetes mellitus without complication ([place])   ? Diabetic neuropathy ([place])   ? Dysrhythmia   ? takes diltiazem prescribed by medical doctor; has never seen cardiologist. Pt states she was having "flutter" episodes but nothing showed on holter monitor per pt  ? Eczema   ? Gastroesophageal reflux disease   ? Humeral shaft fracture   ? right  ? Hyperlipidemia   ? Hypersomnia   ? IBS (irritable bowel syndrome)   ? Myalgia   ? Noncompliance with CPAP treatment   ? Osteoarthritis of both feet 01/05/2016  ? Osteoarthritis of both hands 01/05/2016  ? Osteoarthritis of both knees 01/05/2016  ? Pedal edema 01/05/2016  ? Peripheral vascular disease ([place])   ? RA (rheumatoid arthritis) ([place]) 01/05/2016  ? +RF, +CCP, [place],   ? Rheumatoid arthritis ([place])   ? Sleep apnea 2015  ? home sleep study   ? TMJ (dislocation of temporomandibular joint) 01/05/2016  ? Varicose veins   ? ? ?Past Surgical History  ? ?Past Surgical History:  ?Procedure Laterality Date  ? BACK  SURGERY    ? CESAREAN SECTION    ? COLONOSCOPY  02/05/2009  ? Dr. Collene Mares in [place]  ? ELBOW SURGERY    ? rheumatoid nodules removed  ? ENDOVENOUS ABLATION SAPHENOUS VEIN W/ LASER    ? ORIF HUMERUS FRACTURE Right 03/29/2017  ? Procedure: OPEN REDUCTION INTERNAL FIXATION (ORIF) RIGHT HUMERAL SHAFT FRACTURE;  Surgeon: Newt Minion, MD;  Location: [facility];  Service: Orthopedics;  Laterality: Right;  ? ? ?Social History  ? ?Social History  ? ?Tobacco Use  ? Smoking status: Every Day  ?  Packs/day: 0.50  ?  Years: 40.00  ?  Pack years: 20.00  ?  Types: Cigarettes  ? Smokeless tobacco: Never  ?Vaping Use  ? Vaping Use: Never used  ?Substance Use Topics  ? Alcohol use: No  ?  Alcohol/week: 0.0 standard drinks  ? Drug use: No  ? ? ?Medications  ? ?Prior to Admission medications   ?Medication Sig Start Date End Date Taking? Authorizing Provider  ?acetaminophen (TYLENOL) 650 MG CR tablet Take 1,300 mg by mouth every 8 (eight) hours as needed for pain.   Yes [provider]  ?ALPRAZolam (XANAX) 0.5 MG tablet Take 0.5 mg by mouth 3 (three) times daily as needed for anxiety.    Yes [provider]  ?amoxicillin-clavulanate (AUGMENTIN) 500-125 MG tablet Take 1 tablet by mouth 2 (two) times daily. 04/27/21  Yes [provider]  ?atomoxetine (STRATTERA) 60 MG capsule Take 60 mg by mouth daily.    Yes [provider]  ?buPROPion (WELLBUTRIN XL) 300 MG 24 hr tablet Take 300 mg by mouth daily.    Yes [provider]  ?Cyanocobalamin (B-12) 5000 MCG CAPS Take 5,000 mcg by mouth daily.   Yes [provider]  ?diltiazem (CARDIZEM CD) 180 MG 24 hr capsule Take 180 mg by mouth daily. 01/11/21  Yes [provider]  ?gabapentin (NEURONTIN) 100 MG capsule Take 100 mg by mouth 3 (three) times daily.   Yes [provider]  ?glimepiride (AMARYL) 2 MG tablet Take 2 mg by mouth daily with breakfast.   Yes [provider]  ?lisinopril (PRINIVIL,ZESTRIL) 5 MG tablet Take  5 mg by mouth daily.    Yes [provider]  ?NOVOLOG FLEXPEN 100 UNIT/ML FlexPen Inject 0-10 Units into the skin 3 (three) times daily with meals. 01/05/20  Yes [provider]  ?OZEMPIC, 1 MG/DOSE, 4 MG/3ML SOPN Inject 1 mg into the skin every Wednesday. 01/23/21  Yes [provider]  ?pantoprazole (PROTONIX) 40 MG tablet Take 40 mg by mouth 2 (two) times daily.    Yes [provider]  ?sulfaSALAzine (AZULFIDINE) 500 MG tablet TAKE 2 TABLETS IN THE MORNING AND 1 TABLET IN THE EVENING WITH FOOD 03/15/21  Yes Ofilia Neas, PA-C  ?topiramate (TOPAMAX) 200 MG tablet Take 200 mg by mouth 2 (two) times daily.    Yes [provider]  ?TOUJEO SOLOSTAR 300 UNIT/ML Solostar Pen Inject 48 Units into the skin daily. 04/11/21  Yes [provider]  ?Vitamin D, Ergocalciferol, 50 MCG (2000 UT) CAPS Take 2,000 Units by mouth daily.   Yes [provider]  ?[place] test strip  10/01/19   [provider]  ?DROPLET PEN NEEDLES 32G X 4 MM [place]  10/12/19   [provider]  ? ? ?Allergies  ? ?Allergies  ?Allergen Reactions  ? Plaquenil [Hydroxychloroquine] Rash  ? Hydrocodone Nausea And Vomiting  ? Prozac [Fluoxetine] Other (See Comments)  ?  Mania  ? ? ?Review of Systems  ?ROS ? ?Neurologic Exam  ?Awake, alert, oriented ?Memory and concentration grossly intact ?Speech fluent, appropriate ?CN grossly intact ?Motor exam: ?Upper Extremities Deltoid Bicep Tricep Grip  ?Right 5/5 5/5 5/5 5/5  ?Left 5/5 5/5 5/5 5/5  ? ?Lower Extremities IP Quad PF DF EHL  ?Right 5/5 5/5 5/5 5/5 5/5  ?Left 5/5 5/5 5/5 5/5 5/5  ? ?Sensation grossly intact to LT ? ?Imaging  ?MRI of the lumbar spine dated 10/18/2020 was personally reviewed and demonstrates susceptibility artifact from the previous L4-5 fusion.  No obvious stenosis at this level.  There is primary finding at L3-4 where there is broad-based disc bulge with significant bilateral facet arthropathy that contribute  to moderate to severe central and lateral recess stenosis. ? ?Impression  ?- 71 y.o. female 6 years status post L4-5 decompression and fusion with recurrence of back and right greater than left leg pain related to adjacent level stenosis. ? ?Plan  ?-We will plan on proceeding with L3-4 decompression and extension of fusion to L3-5. ? ?I have reviewed the details of the operation as well as the expected postoperative course and recovery at length in the office with the patient.  We have also reviewed the associated risks, benefits, and alternatives to surgery.  All her questions today were answered and she provided informed consent to proceed. ? ?  Consuella Lose, MD ?[facility]  ? ?

## 2021-05-09 NOTE — Evaluation (Addendum)
Physical Therapy Evaluation ?Patient Details ?Name: Margaret MayhewSarah M  ?MRN: [id number] ?DOB: 07/08/1950 ?Today's Date: 05/09/2021 ? ?History of Present Illness ? pt is a 71 y/o female presenting 4/4 with pain not managed by conservative treatments.  pt s/p decompression at L 3, fusion at L3-4 with segmental instrumentation at L 3-L5, removal of previous instrumentation.  PMHx:  ADD, bipolar d/o, DDD, DM, diabetic neuropathy, GERD, IBS, OA, RA,  ?Clinical Impression ? Pt admitted with/for lumbar fusion/decompression surgery.  Pt needing min to min guard assist overall.  Pt too sedated to have internalized the education, but pt's husbandverbalized understanding..  Pt currently limited functionally due to the problems listed below.  (see problems list.)  Pt will benefit from PT to maximize function and safety to be able to get home safely with available assist. ?   ?   ? ?Recommendations for follow up therapy are one component of a multi-disciplinary discharge planning process, led by the attending physician.  Recommendations may be updated based on patient status, additional functional criteria and insurance authorization. ? ?Follow Up Recommendations Home health PT ? ?  ?Assistance Recommended at Discharge Set up Supervision/Assistance  ?Patient can return home with the following ? A little help with bathing/dressing/bathroom;A little help with walking and/or transfers;Assistance with cooking/housework;Assist for transportation;Help with stairs or ramp for entrance ? ?  ?Equipment Recommendations    ?Recommendations for Other Services ?    ?  ?Functional Status Assessment Patient has had a recent decline in their functional status and demonstrates the ability to make significant improvements in function in a reasonable and predictable amount of time.  ? ?  ?Precautions / Restrictions Precautions ?Precautions: Back ?Restrictions ?Weight Bearing Restrictions: No  ? ?  ? ?Mobility ? Bed Mobility ?Overal bed mobility: Needs  Assistance ?Bed Mobility: Rolling, Sidelying to Sit, Sit to Sidelying ?Rolling: Min assist ?Sidelying to sit: Min assist ?  ?  ?Sit to sidelying: Min assist ?  ?  ? ?Transfers ?Overall transfer level: Needs assistance ?Equipment used: Rolling walker (2 wheels) ?Transfers: Sit to/from Stand ?Sit to Stand: Min assist ?  ?  ?  ?  ?  ?General transfer comment: cues for hand placement ?  ? ?Ambulation/Gait ?Ambulation/Gait assistance: Min guard ?Gait Distance (Feet): 100 Feet ?Assistive device: Rolling walker (2 wheels) ?Gait Pattern/deviations: Step-through pattern ?  ?Gait velocity interpretation: <1.8 ft/sec, indicate of risk for recurrent falls ?  ?General Gait Details: tentative gait, slower due to dizzy/sedated. ? ?Stairs ?  ?  ?  ?  ?  ? ?Wheelchair Mobility ?  ? ?Modified Rankin (Stroke Patients Only) ?  ? ?  ? ?Balance Overall balance assessment: Mild deficits observed, not formally tested ?  ?  ?  ?  ?  ?  ?  ?  ?  ?  ?  ?  ?  ?  ?  ?  ?  ?  ?   ? ? ? ?Pertinent Vitals/Pain Pain Assessment ?Pain Assessment: 0-10 ?Pain Score: 10-Worst pain ever ?Pain Location: incisional area ?Pain Descriptors / Indicators: Discomfort, Grimacing, Guarding, Sore ?Pain Intervention(s): Monitored during session, Limited activity within patient's tolerance, Repositioned, Patient requesting pain meds-RN notified  ? ? ?Home Living Family/patient expects to be discharged to:: Private residence ?Living Arrangements: Spouse/significant other ?Available Help at Discharge: Family;Available 24 hours/day ?Type of Home: House ?Home Access: Stairs to enter ?Entrance Stairs-Rails: Right;Left ?Entrance Stairs-Number of Steps: up to 7 ?Alternate Level Stairs-Number of Steps: flight ?Home Layout: Two level;Bed/bath upstairs (has been sleeping  in a recliner for some time now.) ?Home Equipment: Agricultural consultant (2 wheels);Cane - single point;BSC/3in1;Other (comment) (adjustable bed.) ?   ?  ?Prior Function Prior Level of Function :  Independent/Modified Independent ?  ?  ?  ?  ?  ?  ?Mobility Comments: limited recently ?  ?  ? ? ?Hand Dominance  ?   ? ?  ?Extremity/Trunk Assessment  ? Upper Extremity Assessment ?Upper Extremity Assessment: Overall WFL for tasks assessed ?  ? ?Lower Extremity Assessment ?Lower Extremity Assessment: Overall WFL for tasks assessed (mildly weak) ?  ? ?   ?Communication  ? Communication: No difficulties  ?Cognition Arousal/Alertness: Awake/alert ?Behavior During Therapy: [facility] for tasks assessed/performed ?Overall Cognitive Status: Difficult to assess (pt was sedated and loopy) ?  ?  ?  ?  ?  ?  ?  ?  ?  ?  ?  ?  ?  ?  ?  ?  ?  ?  ?  ? ?  ?General Comments General comments (skin integrity, edema, etc.): Instructed pt/husband in back care/prec, log roll, bracing issues, lifting restrictions, progression of activity. ? ?  ?Exercises    ? ?Assessment/Plan  ?  ?PT Assessment Patient needs continued PT services  ?PT Problem List Decreased strength;Decreased activity tolerance;Decreased mobility;Pain ? ?   ?  ?PT Treatment Interventions Gait training;Stair training;Functional mobility training;Therapeutic activities;Patient/family education   ? ?PT Goals (Current goals can be found in the Care Plan section)  ?Acute Rehab PT Goals ?Patient Stated Goal: I want to go home early ?PT Goal Formulation: With patient ?Time For Goal Achievement: 05/11/21 ?Potential to Achieve Goals: Good ? ?  ?Frequency Min 3X/week ?  ? ? ?Co-evaluation   ?  ?  ?  ?  ? ? ?  ?AM-PAC PT "6 Clicks" Mobility  ?Outcome Measure Help needed turning from your back to your side while in a flat bed without using bedrails?: A Little ?Help needed moving from lying on your back to sitting on the side of a flat bed without using bedrails?: A Little ?Help needed moving to and from a bed to a chair (including a wheelchair)?: A Little ?Help needed standing up from a chair using your arms (e.g., wheelchair or bedside chair)?: A Little ?Help needed to walk in hospital  room?: A Little ?Help needed climbing 3-5 steps with a railing? : A Little ?6 Click Score: 18 ? ?  ?End of Session   ?Activity Tolerance: Patient tolerated treatment well;Patient limited by lethargy ?Patient left: in bed;with call bell/phone within reach ?Nurse Communication: Mobility status ?PT Visit Diagnosis: Other abnormalities of gait and mobility (R26.89);Pain ?Pain - part of body:  (back) ?  ? ?Time: 1534-1610 ?PT Time Calculation (min) (ACUTE ONLY): 36 min ? ? ?Charges:   PT Evaluation ?$PT Eval Moderate Complexity: 1 Mod ?PT Treatments ?$Gait Training: 8-22 mins ?  ?   ? ? ?05/09/2021 ? ?Jacinto Halim., PT ?Acute Rehabilitation Services ?[phone]  (pager) ?[phone]  (office) ? ?Eliseo Gum Zerek Litsey ?05/09/2021, 5:41 PM ? ?

## 2021-05-10 LAB — GLUCOSE, CAPILLARY: Glucose-Capillary: 121 mg/dL — ABNORMAL HIGH (ref 70–99)

## 2021-05-10 LAB — HEMOGLOBIN A1C
Hgb A1c MFr Bld: 5.7 % — ABNORMAL HIGH (ref 4.8–5.6)
Mean Plasma Glucose: 117 mg/dL

## 2021-05-10 LAB — CBC
HCT: 41.1 % (ref 36.0–46.0)
Hemoglobin: 13.5 g/dL (ref 12.0–15.0)
MCH: 32.8 pg (ref 26.0–34.0)
MCHC: 32.8 g/dL (ref 30.0–36.0)
MCV: 99.8 fL (ref 80.0–100.0)
Platelets: 237 10*3/uL (ref 150–400)
RBC: 4.12 MIL/uL (ref 3.87–5.11)
RDW: 14.2 % (ref 11.5–15.5)
WBC: 16.4 10*3/uL — ABNORMAL HIGH (ref 4.0–10.5)
nRBC: 0 % (ref 0.0–0.2)

## 2021-05-10 LAB — BASIC METABOLIC PANEL
Anion gap: 9 (ref 5–15)
BUN: 18 mg/dL (ref 8–23)
CO2: 19 mmol/L — ABNORMAL LOW (ref 22–32)
Calcium: 9.1 mg/dL (ref 8.9–10.3)
Chloride: 108 mmol/L (ref 98–111)
Creatinine, Ser: 1.32 mg/dL — ABNORMAL HIGH (ref 0.44–1.00)
GFR, Estimated: 44 mL/min — ABNORMAL LOW (ref >60)
Glucose, Bld: 125 mg/dL — ABNORMAL HIGH (ref 70–99)
Potassium: 4.4 mmol/L (ref 3.5–5.1)
Sodium: 136 mmol/L (ref 135–145)

## 2021-05-10 MED ORDER — ATOMOXETINE HCL 10 MG PO CAPS
20.0000 mg | ORAL_CAPSULE | Freq: Every day | ORAL | Status: DC
  Filled 2021-05-10: qty 2

## 2021-05-10 MED ORDER — METHOCARBAMOL 500 MG PO TABS: 500.0000 mg | ORAL_TABLET | Freq: Three times a day (TID) | ORAL | 0 refills | Status: AC | PRN

## 2021-05-10 MED ORDER — OXYCODONE HCL 10 MG PO TABS: 10.0000 mg | ORAL_TABLET | ORAL | 0 refills | Status: AC | PRN

## 2021-05-10 MED ORDER — ATOMOXETINE HCL 40 MG PO CAPS
40.0000 mg | ORAL_CAPSULE | Freq: Every day | ORAL | Status: DC
  Filled 2021-05-10: qty 1

## 2021-05-10 MED ORDER — ATOMOXETINE HCL 60 MG PO CAPS: 60.0000 mg | ORAL_CAPSULE | Freq: Every day | ORAL | Status: DC

## 2021-05-10 MED FILL — Thrombin For Soln 5000 Unit: CUTANEOUS | Qty: 5000 | Status: AC

## 2021-05-10 NOTE — Progress Notes (Signed)
Physical Therapy Treatment ?Patient Details ?Name: Margaret [name] ?MRN: [id number] ?DOB: 07/01/1950 ?Today's Date: 05/10/2021 ? ? ?History of Present Illness Pt is a 71 y/o female who presents s/p L3-L5 PLIF on 05/09/2021.  PMHx:  ADD, bipolar d/o, DDD, DM, diabetic neuropathy, GERD, IBS, OA, RA, ? ?  ?PT Comments  ? ? Pt progressing well with post-op mobility. She was able to demonstrate transfers and ambulation with gross min guard assist and stair training with min assist for balance support and safety. Pt and husband educated on precautions, brace application/wearing schedule, appropriate activity progression, and car transfer. Will continue to follow.   ?   ?Recommendations for follow up therapy are one component of a multi-disciplinary discharge planning process, led by the attending physician.  Recommendations may be updated based on patient status, additional functional criteria and insurance authorization. ? ?Follow Up Recommendations ? Home health PT ?  ?  ?Assistance Recommended at Discharge Set up Supervision/Assistance  ?Patient can return home with the following A little help with bathing/dressing/bathroom;A little help with walking and/or transfers;Assistance with cooking/housework;Assist for transportation;Help with stairs or ramp for entrance ?  ?Equipment Recommendations ?    ?  ?Recommendations for Other Services   ? ? ?  ?Precautions / Restrictions Precautions ?Precautions: Back ?Precaution Booklet Issued: Yes (comment) ?Precaution Comments: Reviewed handout and pt was cued for precautions during functional mobility. ?Required Braces or Orthoses: Spinal Brace ?Spinal Brace: Lumbar corset;Applied in sitting position ?Restrictions ?Weight Bearing Restrictions: No  ?  ? ?Mobility ? Bed Mobility ?  ?  ?  ?  ?  ?  ?  ?General bed mobility comments: Pt was received sitting up in the recliner. ?  ? ?Transfers ?Overall transfer level: Needs assistance ?Equipment used: Rolling walker (2 wheels) ?Transfers: Sit  to/from Stand ?Sit to Stand: Min guard ?  ?  ?  ?  ?  ?General transfer comment: Close guard for safety as pt powered up to full stand. VC's for hand placement on seated surface for safety. ?  ? ?Ambulation/Gait ?Ambulation/Gait assistance: Min guard ?Gait Distance (Feet): 150 Feet ?Assistive device: Rolling walker (2 wheels) ?Gait Pattern/deviations: Step-through pattern, Decreased stride length, Trunk flexed ?Gait velocity: Decreased ?Gait velocity interpretation: 1.31 - 2.62 ft/sec, indicative of limited community ambulator ?  ?General Gait Details: Slow but generally steady with RW for support. No overt LOB noted but hands on guarding provided for safety. ? ? ?Stairs ?Stairs: Yes ?Stairs assistance: Min assist ?Stair Management: Two rails, Step to pattern, Forwards ?Number of Stairs: 7 ?General stair comments: VC's for sequencing and general safety. Assist for controlled lower during descent ? ? ?Wheelchair Mobility ?  ? ?Modified Rankin (Stroke Patients Only) ?  ? ? ?  ?Balance Overall balance assessment: Mild deficits observed, not formally tested ?  ?  ?  ?  ?  ?  ?  ?  ?  ?  ?  ?  ?  ?  ?  ?  ?  ?  ?  ? ?  ?Cognition Arousal/Alertness: Awake/alert ?Behavior During Therapy: [facility] for tasks assessed/performed ?Overall Cognitive Status: Impaired/Different from baseline ?Area of Impairment: Attention, Memory, Safety/judgement, Problem solving ?  ?  ?  ?  ?  ?  ?  ?  ?  ?Current Attention Level: Selective ?Memory: Decreased short-term memory, Decreased recall of precautions ?  ?Safety/Judgement: Decreased awareness of safety, Decreased awareness of deficits ?  ?Problem Solving: Slow processing, Requires verbal cues ?General Comments: Likely due to meds. Husband  present and engaged in education. ?  ?  ? ?  ?Exercises   ? ?  ?General Comments General comments (skin integrity, edema, etc.): VSS on RA, husband present and supportive and demonstrated great ability to assist pt as needed ?  ?  ? ?Pertinent Vitals/Pain  Pain Assessment ?Pain Assessment: 0-10 ?Pain Score: 7  ?Pain Location: incisional area ?Pain Descriptors / Indicators: Sore, Operative site guarding ?Pain Intervention(s): Limited activity within patient's tolerance, Monitored during session, Repositioned  ? ? ?Home Living Family/patient expects to be discharged to:: Private residence ?Living Arrangements: Spouse/significant other ?Available Help at Discharge: Family;Available 24 hours/day ?Type of Home: House ?Home Access: Stairs to enter ?Entrance Stairs-Rails: Right;Left ?Entrance Stairs-Number of Steps: up to 7 ?Alternate Level Stairs-Number of Steps: flight ?Home Layout: Two level;Bed/bath upstairs ?Home Equipment: Agricultural consultantolling Walker (2 wheels);Shower seat;Toilet riser;Grab bars - tub/shower;Adaptive equipment ?Additional Comments: pt had 7 STE the main level of her home with bed and bath; laundry and other work areas are downstairs, her husband states she does not go down there  ?  ?Prior Function    ?  ?  ?   ? ?PT Goals (current goals can now be found in the care plan section) Acute Rehab PT Goals ?Patient Stated Goal: Home today ?PT Goal Formulation: With patient ?Time For Goal Achievement: 05/11/21 ?Potential to Achieve Goals: Good ?Progress towards PT goals: Progressing toward goals ? ?  ?Frequency ? ? ? Min 5X/week ? ? ? ?  ?PT Plan Current plan remains appropriate  ? ? ?Co-evaluation   ?  ?  ?  ?  ? ?  ?AM-PAC PT "6 Clicks" Mobility   ?Outcome Measure ? Help needed turning from your back to your side while in a flat bed without using bedrails?: A Little ?Help needed moving from lying on your back to sitting on the side of a flat bed without using bedrails?: A Little ?Help needed moving to and from a bed to a chair (including a wheelchair)?: A Little ?Help needed standing up from a chair using your arms (e.g., wheelchair or bedside chair)?: A Little ?Help needed to walk in hospital room?: A Little ?Help needed climbing 3-5 steps with a railing? : A Little ?6  Click Score: 18 ? ?  ?End of Session Equipment Utilized During Treatment: Gait belt;Back brace ?Activity Tolerance: Patient tolerated treatment well ?Patient left: in chair;with call bell/phone within reach;with family/visitor present ?Nurse Communication: Mobility status ?PT Visit Diagnosis: Other abnormalities of gait and mobility (R26.89);Pain ?Pain - part of body:  (back) ?  ? ? ?Time: [id number] ?PT Time Calculation (min) (ACUTE ONLY): 23 min ? ?Charges:  $Gait Training: 23-37 mins          ?          ? ?Conni SlipperLaura Quinnton Bury, PT, DPT ?Acute Rehabilitation Services ?Secure Chat Preferred ?Office: [phone]  ? ? ?Marylynn PearsonLaura D Netha Dafoe ?05/10/2021, 10:20 AM ? ?

## 2021-05-10 NOTE — Progress Notes (Signed)
Patient alert and oriented, voiding adequately, skin clean, dry and intact without evidence of skin break down, or symptoms of complications - no redness or edema noted, only slight tenderness at site.  Patient states pain is manageable at time of discharge, Medication given prior to discharge.  Patient has an appointment with MD in 3 weeks ?

## 2021-05-10 NOTE — Discharge Summary (Signed)
? ?Physician Discharge Summary  ?Patient ID: ?Margaret [name] ?MRN: [id number] ?DOB/AGE: [birth year] 69 y.o. ? ?Admit date: 05/09/2021 ?Discharge date: 05/10/2021 ? ?Admission Diagnoses:  ?Lumbar stenosis with claudication ?Adjacent level stenosis ? ?Discharge Diagnoses:  ?Same ?Principal Problem: ?  Lumbar stenosis with neurogenic claudication ? ? ?Discharged Condition: Stable ? ?Hospital Course:  ?Margaret [name] is a 71 y.o. female who underwent uncomplicated L3-4 decompression, extension of fusion. She was at baseline postop, ambulating well, voiding normally, tolerating diet with pain under control. She therefore requested discharge home. ? ?Treatments: Surgery - L3-4 decompression fusion ? ?Discharge Exam: ?Blood pressure (!) 112/54, pulse (!) 101, temperature 97.9 ?F (36.6 ?C), resp. rate 16, height 5\' 2"  (1.575 m), weight 93 kg, SpO2 98 %. ?Awake, alert, oriented ?Speech fluent, appropriate ?CN grossly intact ?5/5 BUE/BLE ?Wound c/d/i ? ?Disposition: Discharge disposition: 01-Home or Self Care ? ? ? ? ? ? ?Discharge Instructions   ? ? Call MD for:  redness, tenderness, or signs of infection (pain, swelling, redness, odor or green/yellow discharge around incision site)   Complete by: As directed ?  ? Call MD for:  temperature >100.4   Complete by: As directed ?  ? Diet - low sodium heart healthy   Complete by: As directed ?  ? Discharge instructions   Complete by: As directed ?  ? Walk at home as much as possible, at least 4 times / day  ? Incentive spirometry RT   Complete by: As directed ?  ? Increase activity slowly   Complete by: As directed ?  ? Lifting restrictions   Complete by: As directed ?  ? No lifting > 10 lbs  ? May shower / Bathe   Complete by: As directed ?  ? 48 hours after surgery  ? May walk up steps   Complete by: As directed ?  ? Other Restrictions   Complete by: As directed ?  ? No bending/twisting at waist  ? Remove dressing in 24 hours   Complete by: As directed ?  ? ?  ? ?Allergies as of  05/10/2021   ? ?   Reactions  ? Plaquenil [hydroxychloroquine] Rash  ? Hydrocodone Nausea And Vomiting  ? Prozac [fluoxetine] Other (See Comments)  ? Mania  ? ?  ? ?  ?Medication List  ?  ? ?STOP taking these medications   ? ?amoxicillin-clavulanate 500-125 MG tablet ?Commonly known as: AUGMENTIN ?  ? ?  ? ?TAKE these medications   ? ?Accu-Chek Aviva Plus test strip ?Generic drug: glucose blood ?  ?acetaminophen 650 MG CR tablet ?Commonly known as: TYLENOL ?Take 1,300 mg by mouth every 8 (eight) hours as needed for pain. ?  ?ALPRAZolam 0.5 MG tablet ?Commonly known as: Prudy Feeler ?Take 0.5 mg by mouth 3 (three) times daily as needed for anxiety. ?  ?atomoxetine 60 MG capsule ?Commonly known as: STRATTERA ?Take 60 mg by mouth daily. ?  ?B-12 5000 MCG Caps ?Take 5,000 mcg by mouth daily. ?  ?buPROPion 300 MG 24 hr tablet ?Commonly known as: WELLBUTRIN XL ?Take 300 mg by mouth daily. ?  ?diltiazem 180 MG 24 hr capsule ?Commonly known as: CARDIZEM CD ?Take 180 mg by mouth daily. ?  ?Droplet Pen Needles 32G X 4 MM Misc ?Generic drug: Insulin Pen Needle ?  ?gabapentin 100 MG capsule ?Commonly known as: NEURONTIN ?Take 100 mg by mouth 3 (three) times daily. ?  ?glimepiride 2 MG tablet ?Commonly known as: AMARYL ?Take 2 mg by mouth daily  with breakfast. ?  ?lisinopril 5 MG tablet ?Commonly known as: ZESTRIL ?Take 5 mg by mouth daily. ?  ?methocarbamol 500 MG tablet ?Commonly known as: ROBAXIN ?Take 1 tablet (500 mg total) by mouth every 8 (eight) hours as needed for muscle spasms. ?  ?NovoLOG FlexPen 100 UNIT/ML FlexPen ?Generic drug: insulin aspart ?Inject 0-10 Units into the skin 3 (three) times daily with meals. ?  ?Oxycodone HCl 10 MG Tabs ?Take 1 tablet (10 mg total) by mouth every 4 (four) hours as needed for up to 7 days for severe pain ((score 7 to 10)). ?  ?Ozempic (1 MG/DOSE) 4 MG/3ML Sopn ?Generic drug: Semaglutide (1 MG/DOSE) ?Inject 1 mg into the skin every Wednesday. ?  ?pantoprazole 40 MG tablet ?Commonly known as:  PROTONIX ?Take 40 mg by mouth 2 (two) times daily. ?  ?sulfaSALAzine 500 MG tablet ?Commonly known as: AZULFIDINE ?TAKE 2 TABLETS IN THE MORNING AND 1 TABLET IN THE EVENING WITH FOOD ?  ?topiramate 200 MG tablet ?Commonly known as: TOPAMAX ?Take 200 mg by mouth 2 (two) times daily. ?  ?Toujeo SoloStar 300 UNIT/ML Solostar Pen ?Generic drug: insulin glargine (1 Unit Dial) ?Inject 48 Units into the skin daily. ?  ?Vitamin D (Ergocalciferol) 50 MCG (2000 UT) Caps ?Take 2,000 Units by mouth daily. ?  ? ?  ? ? Follow-up Information   ? ? Lisbeth Renshaw, MD Follow up in 3 week(s).   ?Specialty: Neurosurgery ?Contact information: ?[address] ?[address] ?[place] [address] ?[phone] ? ? ?  ?  ? ?  ?  ? ?  ? ? ?Signed: ?Jackelyn Hoehn ?05/10/2021, 8:58 AM ? ? ?

## 2021-05-10 NOTE — Evaluation (Signed)
Occupational Therapy Evaluation ?Patient Details ?Name: Margaret MayhewSarah M [name] ?MRN: [id number] ?DOB: 03/23/1950 ?Today's Date: 05/10/2021 ? ? ?History of Present Illness Pt is a 71 y/o female who presents s/p L3-L5 PLIF on 05/09/2021.  PMHx:  ADD, bipolar d/o, DDD, DM, diabetic neuropathy, GERD, IBS, OA, RA,  ? ?Clinical Impression ?  ?Pt was evaluated s/p the above back sx, she is generally indep at baseline and her husband assisted with ADLs/mobiilty if needed due to pain. They live in a split level home with 7 STE and bed/bath is on the main living area. After review of back precautions and compensatory techniques pt demonstrated good ability to complete ADLs while maintaining precaution with min A. Pt's husband is familiar with back precautions and assisting pt from prior back sx. Pt does not require further OT and is already well equipped. Recommend d/c home with support of family.   ? ?Recommendations for follow up therapy are one component of a multi-disciplinary discharge planning process, led by the attending physician.  Recommendations may be updated based on patient status, additional functional criteria and insurance authorization.  ? ?Follow Up Recommendations ? No OT follow up  ?  ?Assistance Recommended at Discharge Frequent or constant Supervision/Assistance  ?Patient can return home with the following A little help with walking and/or transfers;A little help with bathing/dressing/bathroom;Help with stairs or ramp for entrance;Assist for transportation ? ?  ?Functional Status Assessment ? Patient has had a recent decline in their functional status and demonstrates the ability to make significant improvements in function in a reasonable and predictable amount of time.  ?Equipment Recommendations ? None recommended by OT  ?  ?Recommendations for Other Services   ? ? ?  ?Precautions / Restrictions Precautions ?Precautions: Back ?Precaution Booklet Issued: Yes (comment) ?Precaution Comments: Reviewed handout and pt  was cued for precautions during functional mobility. ?Required Braces or Orthoses: Spinal Brace ?Spinal Brace: Lumbar corset;Applied in sitting position ?Restrictions ?Weight Bearing Restrictions: No  ? ?  ? ?Mobility Bed Mobility ?Overal bed mobility: Needs Assistance ?  ?  ?  ?  ?  ?  ?General bed mobility comments: pt in chair ?  ? ?Transfers ?Overall transfer level: Needs assistance ?Equipment used: Rolling walker (2 wheels) ?Transfers: Sit to/from Stand ?Sit to Stand: Min guard ?  ?  ?  ?  ?  ?General transfer comment: cue for appropriate hand placement on recliner ?  ? ?  ?Balance Overall balance assessment: Mild deficits observed, not formally tested ?  ?  ?  ?  ?  ?  ?  ?  ?  ?  ?  ?  ?  ?  ?  ?  ?  ?  ?   ? ?ADL either performed or assessed with clinical judgement  ? ?ADL Overall ADL's : Needs assistance/impaired ?  ?  ?  ?  ?  ?  ?  ?  ?  ?  ?  ?  ?  ?  ?  ?  ?  ?  ?Functional mobility during ADLs: Supervision/safety;Rolling walker (2 wheels) ?General ADL Comments: Reviewed back precautions and compensatory techniques, requried set up for UB ADLs and min A for LB ADLs. Pt's husband demonstrated great ability to assist pt safely.  ? ? ? ?Vision Baseline Vision/History: 1 Wears glasses ?Vision Assessment?: No apparent visual deficits  ?   ?Perception   ?  ?Praxis   ?  ? ?Pertinent Vitals/Pain Pain Assessment ?Pain Assessment: Faces ?Faces Pain Scale: Hurts little more ?  Pain Location: incisional area ?Pain Descriptors / Indicators: Sore, Operative site guarding ?Pain Intervention(s): Monitored during session, Limited activity within patient's tolerance  ? ? ? ?Hand Dominance Right ?  ?Extremity/Trunk Assessment Upper Extremity Assessment ?Upper Extremity Assessment: Overall WFL for tasks assessed ?  ?Lower Extremity Assessment ?Lower Extremity Assessment: Defer to PT evaluation ?  ?Cervical / Trunk Assessment ?Cervical / Trunk Assessment: Back Surgery ?  ?Communication Communication ?Communication: No  difficulties ?  ?Cognition Arousal/Alertness: Awake/alert ?Behavior During Therapy: [facility] for tasks assessed/performed ?Overall Cognitive Status: Impaired/Different from baseline ?Area of Impairment: Attention, Memory, Safety/judgement, Problem solving ?  ?  ?  ?  ?  ?  ?  ?  ?  ?Current Attention Level: Selective ?Memory: Decreased short-term memory, Decreased recall of precautions ?  ?Safety/Judgement: Decreased awareness of safety, Decreased awareness of deficits ?  ?Problem Solving: Slow processing, Requires verbal cues ?General Comments: Pt repeating herself throughout, asking some of the same questions. Seemingly related to medication. Husband present and has great recall of back precautions and compensaotry techniques ?  ?  ?General Comments  VSS on RA, husband present and supportive and demonstrated great ability to assist pt as needed ? ?  ?Exercises   ?  ?Shoulder Instructions    ? ? ?Home Living Family/patient expects to be discharged to:: Private residence ?Living Arrangements: Spouse/significant other ?Available Help at Discharge: Family;Available 24 hours/day ?Type of Home: House ?Home Access: Stairs to enter ?Entrance Stairs-Number of Steps: up to 7 ?Entrance Stairs-Rails: Right;Left ?Home Layout: Two level;Bed/bath upstairs ?Alternate Level Stairs-Number of Steps: flight ?  ?  ?  ?  ?  ?  ?Home Equipment: Agricultural consultant (2 wheels);Shower seat;Toilet riser;Grab bars - tub/shower;Adaptive equipment ?Adaptive Equipment: Reacher ?Additional Comments: pt had 7 STE the main level of her home with bed and bath; laundry and other work areas are downstairs, her husband states she does not go down there ?  ? ?  ?Prior Functioning/Environment Prior Level of Function : Independent/Modified Independent ?  ?  ?  ?  ?  ?  ?Mobility Comments: limited recently ?  ?  ? ?  ?  ?OT Problem List: Decreased strength;Decreased range of motion;Impaired balance (sitting and/or standing);Decreased activity tolerance;Decreased  knowledge of use of DME or AE;Pain ?  ?   ?OT Treatment/Interventions:    ?  ?OT Goals(Current goals can be found in the care plan section)    ?OT Frequency:   ?  ? ?Co-evaluation   ?  ?  ?  ?  ? ?  ?AM-PAC OT "6 Clicks" Daily Activity     ?Outcome Measure Help from another person eating meals?: None ?Help from another person taking care of personal grooming?: A Little ?Help from another person toileting, which includes using toliet, bedpan, or urinal?: A Little ?Help from another person bathing (including washing, rinsing, drying)?: A Little ?Help from another person to put on and taking off regular upper body clothing?: None ?Help from another person to put on and taking off regular lower body clothing?: A Little ?6 Click Score: 20 ?  ?End of Session Equipment Utilized During Treatment: Rolling walker (2 wheels);Back brace ?Nurse Communication: Mobility status ? ?Activity Tolerance: Patient tolerated treatment well ?Patient left: in chair;with call bell/phone within reach;with family/visitor present ? ?OT Visit Diagnosis: Unsteadiness on feet (R26.81);Other abnormalities of gait and mobility (R26.89);Muscle weakness (generalized) (M62.81);Pain  ?              ?Time: 9774-1423 ?OT Time Calculation (min): 20  min ?Charges:  OT General Charges ?$OT Visit: 1 Visit ?OT Evaluation ?$OT Eval Low Complexity: 1 Low ? ? ?Valera Vallas A Lorris Carducci ?05/10/2021, 10:20 AM ?

## 2021-05-18 DIAGNOSIS — M79641 Pain in right hand: Secondary | ICD-10-CM | POA: Diagnosis not present

## 2021-05-18 DIAGNOSIS — M79642 Pain in left hand: Secondary | ICD-10-CM | POA: Diagnosis not present

## 2021-05-18 DIAGNOSIS — M65332 Trigger finger, left middle finger: Secondary | ICD-10-CM | POA: Diagnosis not present

## 2021-05-22 DIAGNOSIS — N183 Chronic kidney disease, stage 3 unspecified: Secondary | ICD-10-CM | POA: Diagnosis not present

## 2021-05-31 DIAGNOSIS — M48062 Spinal stenosis, lumbar region with neurogenic claudication: Secondary | ICD-10-CM | POA: Diagnosis not present

## 2021-05-31 DIAGNOSIS — M48061 Spinal stenosis, lumbar region without neurogenic claudication: Secondary | ICD-10-CM | POA: Diagnosis not present

## 2021-05-31 DIAGNOSIS — F3112 Bipolar disorder, current episode manic without psychotic features, moderate: Secondary | ICD-10-CM | POA: Diagnosis not present

## 2021-05-31 DIAGNOSIS — Z6835 Body mass index (BMI) 35.0-35.9, adult: Secondary | ICD-10-CM | POA: Diagnosis not present

## 2021-06-04 DIAGNOSIS — E1165 Type 2 diabetes mellitus with hyperglycemia: Secondary | ICD-10-CM | POA: Diagnosis not present

## 2021-06-21 DIAGNOSIS — G5611 Other lesions of median nerve, right upper limb: Secondary | ICD-10-CM | POA: Diagnosis not present

## 2021-06-21 NOTE — Progress Notes (Signed)
Office Visit Note  Patient: Margaret [name]             Date of Birth: 1950/02/21           MRN: [id number]             PCP: Monico Blitz, MD Referring: Monico Blitz, MD Visit Date: 07/05/2021 Occupation: @GUAROCC @  Subjective:  Medication management  History of Present Illness: Margaret [name] is a 71 y.o. female with history of rheumatoid arthritis, osteoarthritis and degenerative disc disease.  She states that she continues to have discomfort in multiple joints.  She denies any joint swelling.  She underwent lumbar spine surgery in April and recovered well.  She states her mobility is better since she had surgery on the lumbar spine.  She also was found to have elevated creatinine.  She states that her nephrologist added bicarb to her medication list.  According to the patient, she will have repeat labs soon by the nephrologist.  Activities of Daily Living:  Patient reports morning stiffness for 15 minutes.   Patient Denies nocturnal pain.  Difficulty dressing/grooming: Denies Difficulty climbing stairs: Denies Difficulty getting out of chair: Denies Difficulty using hands for taps, buttons, cutlery, and/or writing: Denies  Review of Systems  Constitutional:  Positive for fatigue.  HENT:  Positive for mouth dryness. Negative for mouth sores and nose dryness.   Eyes:  Positive for dryness. Negative for pain and itching.  Respiratory:  Negative for shortness of breath and difficulty breathing.   Cardiovascular:  Negative for chest pain and palpitations.  Gastrointestinal:  Positive for constipation. Negative for blood in stool and diarrhea.  Endocrine: Negative for increased urination.  Genitourinary:  Negative for difficulty urinating.  Musculoskeletal:  Positive for joint pain, joint pain, myalgias, morning stiffness, muscle tenderness and myalgias. Negative for joint swelling.  Skin:  Positive for color change. Negative for rash, redness and sensitivity to sunlight.   Allergic/Immunologic: Negative for susceptible to infections.  Neurological:  Positive for numbness. Negative for dizziness, headaches, memory loss and weakness.  Hematological:  Negative for bruising/bleeding tendency.  Psychiatric/Behavioral:  Negative for confusion.    PMFS History:  Patient Active Problem List   Diagnosis Date Noted   Lumbar stenosis with neurogenic claudication 05/09/2021   Idiopathic chronic venous hypertension of both lower extremities with inflammation 06/18/2017   Closed displaced oblique fracture of shaft of right humerus 03/27/2017   Smoker 01/09/2016   High risk medication use 01/06/2016   RA (rheumatoid arthritis) ([place]) 01/05/2016   DDD (degenerative disc disease), lumbar 01/05/2016   Calcaneal spur 01/05/2016   Bipolar disorder ([place]) 01/05/2016   ADD (attention deficit disorder) 01/05/2016   Sleep apnea 01/05/2016   TMJ (dislocation of temporomandibular joint) 01/05/2016   Pedal edema 01/05/2016   Osteoarthritis of both hands 01/05/2016   Osteoarthritis of both feet 01/05/2016   Osteoarthritis of both knees 01/05/2016   GERD (gastroesophageal reflux disease) 06/24/2015   Constipation 06/24/2015   Spondylolisthesis at L4-L5 level 01/24/2015   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   CONTRACTURE OF SHOULDER JOINT 04/24/2007   DIABETES 07/30/2006    Past Medical History:  Diagnosis Date   ADD (attention deficit disorder) 01/05/2016   Anemia    Arthritis    Asthma    Benign essential hypertension    Bipolar disorder ([place])    hospitalized at [facility] for manic episodes   Calcaneal spur 01/05/2016   Chronic kidney disease    stage 3 per pt   DDD (degenerative disc  disease), lumbar 01/05/2016   Diabetes mellitus without complication (HCC)    Diabetic neuropathy ([place])    Dysrhythmia    takes diltiazem prescribed by medical doctor; has never seen cardiologist. Pt states she was having "flutter" episodes but nothing showed on holter monitor per pt   Eczema     Gastroesophageal reflux disease    Humeral shaft fracture    right   Hyperlipidemia    Hypersomnia    IBS (irritable bowel syndrome)    Myalgia    Noncompliance with CPAP treatment    Osteoarthritis of both feet 01/05/2016   Osteoarthritis of both hands 01/05/2016   Osteoarthritis of both knees 01/05/2016   Pedal edema 01/05/2016   Peripheral vascular disease (HCC)    RA (rheumatoid arthritis) ([place]) 01/05/2016   +RF, +CCP, Nodulous,    Rheumatoid arthritis ([place])    Sleep apnea 2015   home sleep study    TMJ (dislocation of temporomandibular joint) 01/05/2016   Varicose veins     Family History  Problem Relation Age of Onset   Hypertension Father    Alcoholism Mother    Stroke Mother    AAA (abdominal aortic aneurysm) Brother    Gallstones Sister    Osteoporosis Sister    Alcoholism Brother    Diabetes Brother    Healthy Son    Healthy Daughter    Colon cancer Neg Hx    Colon polyps Neg Hx    Past Surgical History:  Procedure Laterality Date   BACK SURGERY     BACK SURGERY  05/09/2021   CESAREAN SECTION     COLONOSCOPY  02/05/2009   Dr. Collene Mares in [place]   ELBOW SURGERY     rheumatoid nodules removed   ENDOVENOUS ABLATION SAPHENOUS VEIN W/ LASER     ORIF HUMERUS FRACTURE Right 03/29/2017   Procedure: OPEN REDUCTION INTERNAL FIXATION (ORIF) RIGHT HUMERAL SHAFT FRACTURE;  Surgeon: Newt Minion, MD;  Location: [facility];  Service: Orthopedics;  Laterality: Right;   Social History   Social History Narrative   Not on file   Immunization History  Administered Date(s) Administered   Influenza, High Dose Seasonal PF 12/19/2017   Moderna Sars-Covid-2 Vaccination 02/26/2019, 04/03/2019   Tdap 12/19/2017   Zoster Recombinat (Shingrix) 12/19/2017, 03/03/2018     Objective: Vital Signs: BP 119/78 (BP Location: Left Arm, Patient Position: Sitting, Cuff Size: Normal)   Pulse 94   Ht 5' 2.5" (1.588 m)   Wt 193 lb 6.4 oz (87.7 kg)   BMI 34.81 kg/m    Physical  Exam Vitals and nursing note reviewed.  Constitutional:      Appearance: She is well-developed.  HENT:     Head: Normocephalic and atraumatic.  Eyes:     Conjunctiva/sclera: Conjunctivae normal.  Cardiovascular:     Rate and Rhythm: Normal rate and regular rhythm.     Heart sounds: Normal heart sounds.  Pulmonary:     Effort: Pulmonary effort is normal.     Breath sounds: Normal breath sounds.  Abdominal:     General: Bowel sounds are normal.     Palpations: Abdomen is soft.  Musculoskeletal:     Cervical back: Normal range of motion.  Lymphadenopathy:     Cervical: No cervical adenopathy.  Skin:    General: Skin is warm and dry.     Capillary Refill: Capillary refill takes less than 2 seconds.  Neurological:     Mental Status: She is alert and oriented to person, place, and  time.  Psychiatric:        Behavior: Behavior normal.     Musculoskeletal Exam: C-spine was in good range of motion.  She had limited painful range of motion of the lumbar spine.  Shoulder joints, elbow joints, wrist joints, MCPs PIPs and DIPs and good range of motion.  She had bilateral CMC PIP and DIP thickening.  No synovitis was noted.  Hip joints and knee joints in good range of motion.  No warmth swelling or effusion was noted.  There was no tenderness over ankles or MTPs.  CDAI Exam: CDAI Score: 0.6  Patient Global: 3 mm; Provider Global: 3 mm Swollen: 0 ; Tender: 0  Joint Exam 07/05/2021   No joint exam has been documented for this visit   There is currently no information documented on the homunculus. Go to the Rheumatology activity and complete the homunculus joint exam.  Investigation: No additional findings.  Imaging: No results found.  Recent Labs: Lab Results  Component Value Date   WBC 16.4 (H) 05/10/2021   HGB 13.5 05/10/2021   PLT 237 05/10/2021   NA 136 05/10/2021   K 4.4 05/10/2021   CL 108 05/10/2021   CO2 19 (L) 05/10/2021   GLUCOSE 125 (H) 05/10/2021   BUN 18  05/10/2021   CREATININE 1.32 (H) 05/10/2021   BILITOT 0.3 01/31/2021   ALKPHOS 87 01/11/2020   AST 12 01/31/2021   ALT 8 01/31/2021   PROT 6.4 01/31/2021   ALBUMIN 4.3 01/11/2020   CALCIUM 9.1 05/10/2021   GFRAA 54 (L) 03/29/2020    Speciality Comments: No specialty comments available.  Procedures:  No procedures performed Allergies: Plaquenil [hydroxychloroquine], Hydrocodone, and Prozac [fluoxetine]   Assessment / Plan:     Visit Diagnoses: Rheumatoid arthritis involving multiple sites with positive rheumatoid factor (HCC)-she had no synovitis on examination.  She denies any history of joint swelling.  She has been tolerating sulfasalazine without any side effects.  She continues to have some discomfort in her joints due to underlying osteoarthritis.  High risk medication use - Sulfasalazine 500 mg 2 tablets in the morning and 1 tablet in the evening.  Labs obtained in the hospital showed elevated white cell count and elevated creatinine.  I advised to have repeat labs but patient declined.  She states she will be getting labs through nephrology office.  I advised her to forward labs to [place].  She was advised to get labs in July and then every 3 months to monitor for drug toxicity.  Information regarding immunization was placed in the AVS.  Primary osteoarthritis of both hands-she had bilateral CMC PIP and DIP thickening.  No synovitis was noted.  Joint protection was discussed.  Primary osteoarthritis of both knees -she continues to have discomfort in her knee joints.  No warmth swelling or effusion was noted.  Lower extremity muscle strengthening exercises were discussed.  She was evaluated by Dr. French Ana and proceeded with an MRI of the right knee on 12/20/2019.  The MRI was unremarkable overall.    Primary osteoarthritis of both feet-she had no synovitis on examination.  Trochanteric bursitis of both hips -she complains of off-and-on discomfort.  DDD (degenerative disc disease),  lumbar - L3-L4 decompression and extension of fusion in April, 2023 by Dr. Kathyrn Sheriff.  Status post L4-L5 PLIF 2016 by Dr. Kathyrn Sheriff.  Patient noted improvement in her lumbar spine and mobility after the surgery in April 2023.  I reviewed recent records from the hospitalization.  Osteopenia of multiple sites -  DEXA ordered by PCP.   History of humerus fracture  Stage 3a chronic kidney disease ([place]) - She is followed  at [facility].    Other medical problems are listed as follows:  Nail dystrophy  Essential hypertension  History of diabetes mellitus  History of asthma  History of IBS  History of gastroesophageal reflux (GERD)  Attention deficit disorder, unspecified hyperactivity presence  History of bipolar disorder  COVID-19 virus infection - June 2022  Smoker  Orders: No orders of the defined types were placed in this encounter.  No orders of the defined types were placed in this encounter.    Follow-Up Instructions: Return in about 5 months (around 12/05/2021) for Rheumatoid arthritis, Osteoarthritis.   Bo Merino, MD  Note - This record has been created using Editor, commissioning.  Chart creation errors have been sought, but may not always  have been located. Such creation errors do not reflect on  the standard of medical care.

## 2021-06-26 DIAGNOSIS — Z0000 Encounter for general adult medical examination without abnormal findings: Secondary | ICD-10-CM | POA: Diagnosis not present

## 2021-06-26 DIAGNOSIS — E7800 Pure hypercholesterolemia, unspecified: Secondary | ICD-10-CM | POA: Diagnosis not present

## 2021-06-26 DIAGNOSIS — Z299 Encounter for prophylactic measures, unspecified: Secondary | ICD-10-CM | POA: Diagnosis not present

## 2021-06-26 DIAGNOSIS — I10 Essential (primary) hypertension: Secondary | ICD-10-CM | POA: Diagnosis not present

## 2021-06-26 DIAGNOSIS — R42 Dizziness and giddiness: Secondary | ICD-10-CM | POA: Diagnosis not present

## 2021-06-26 DIAGNOSIS — Z6832 Body mass index (BMI) 32.0-32.9, adult: Secondary | ICD-10-CM | POA: Diagnosis not present

## 2021-06-27 DIAGNOSIS — M65332 Trigger finger, left middle finger: Secondary | ICD-10-CM | POA: Diagnosis not present

## 2021-06-27 DIAGNOSIS — G5601 Carpal tunnel syndrome, right upper limb: Secondary | ICD-10-CM | POA: Diagnosis not present

## 2021-07-04 DIAGNOSIS — E1165 Type 2 diabetes mellitus with hyperglycemia: Secondary | ICD-10-CM | POA: Diagnosis not present

## 2021-07-05 ENCOUNTER — Ambulatory Visit: Payer: Medicare PPO | Admitting: Rheumatology

## 2021-07-05 ENCOUNTER — Encounter: Payer: Self-pay | Admitting: Rheumatology

## 2021-07-05 VITALS — BP 119/78 | HR 94 | Ht 62.5 in | Wt 193.4 lb

## 2021-07-05 DIAGNOSIS — Z8639 Personal history of other endocrine, nutritional and metabolic disease: Secondary | ICD-10-CM

## 2021-07-05 DIAGNOSIS — Z8709 Personal history of other diseases of the respiratory system: Secondary | ICD-10-CM

## 2021-07-05 DIAGNOSIS — M4316 Spondylolisthesis, lumbar region: Secondary | ICD-10-CM

## 2021-07-05 DIAGNOSIS — L603 Nail dystrophy: Secondary | ICD-10-CM

## 2021-07-05 DIAGNOSIS — M19042 Primary osteoarthritis, left hand: Secondary | ICD-10-CM

## 2021-07-05 DIAGNOSIS — F17200 Nicotine dependence, unspecified, uncomplicated: Secondary | ICD-10-CM

## 2021-07-05 DIAGNOSIS — N1831 Chronic kidney disease, stage 3a: Secondary | ICD-10-CM

## 2021-07-05 DIAGNOSIS — M7062 Trochanteric bursitis, left hip: Secondary | ICD-10-CM

## 2021-07-05 DIAGNOSIS — M5136 Other intervertebral disc degeneration, lumbar region: Secondary | ICD-10-CM

## 2021-07-05 DIAGNOSIS — I10 Essential (primary) hypertension: Secondary | ICD-10-CM

## 2021-07-05 DIAGNOSIS — M7061 Trochanteric bursitis, right hip: Secondary | ICD-10-CM | POA: Diagnosis not present

## 2021-07-05 DIAGNOSIS — Z8781 Personal history of (healed) traumatic fracture: Secondary | ICD-10-CM | POA: Diagnosis not present

## 2021-07-05 DIAGNOSIS — Z8659 Personal history of other mental and behavioral disorders: Secondary | ICD-10-CM

## 2021-07-05 DIAGNOSIS — M19071 Primary osteoarthritis, right ankle and foot: Secondary | ICD-10-CM

## 2021-07-05 DIAGNOSIS — M0579 Rheumatoid arthritis with rheumatoid factor of multiple sites without organ or systems involvement: Secondary | ICD-10-CM | POA: Diagnosis not present

## 2021-07-05 DIAGNOSIS — Z8719 Personal history of other diseases of the digestive system: Secondary | ICD-10-CM

## 2021-07-05 DIAGNOSIS — M8589 Other specified disorders of bone density and structure, multiple sites: Secondary | ICD-10-CM | POA: Diagnosis not present

## 2021-07-05 DIAGNOSIS — Z79899 Other long term (current) drug therapy: Secondary | ICD-10-CM

## 2021-07-05 DIAGNOSIS — M19041 Primary osteoarthritis, right hand: Secondary | ICD-10-CM

## 2021-07-05 DIAGNOSIS — M170 Bilateral primary osteoarthritis of knee: Secondary | ICD-10-CM | POA: Diagnosis not present

## 2021-07-05 DIAGNOSIS — U071 COVID-19: Secondary | ICD-10-CM

## 2021-07-05 DIAGNOSIS — M19072 Primary osteoarthritis, left ankle and foot: Secondary | ICD-10-CM

## 2021-07-05 DIAGNOSIS — F988 Other specified behavioral and emotional disorders with onset usually occurring in childhood and adolescence: Secondary | ICD-10-CM

## 2021-07-05 NOTE — Patient Instructions (Signed)
Standing Labs ?We placed an order today for your standing lab work.  ? ?Please have your standing labs drawn in July and every 3 months ? ?If possible, please have your labs drawn 2 weeks prior to your appointment so that the provider can discuss your results at your appointment. ? ?Please note that you may see your imaging and lab results in MyChart before we have reviewed them. ?We may be awaiting multiple results to interpret others before contacting you. ?Please allow our office up to 72 hours to thoroughly review all of the results before contacting the office for clarification of your results. ? ?We have open lab daily: ?Monday through Thursday from 1:30-4:30 PM and Friday from 1:30-4:00 PM ?at the office of Dr. Nicholl Onstott, [facility].   ?Please be advised, all patients with office appointments requiring lab work will take precedent over walk-in lab work.  ?If possible, please come for your lab work on Monday and Friday afternoons, as you may experience shorter wait times. ?The office is located at [address], [address], [place], [place] [address] ?No appointment is necessary.   ?Labs are drawn by [facility]. Please bring your co-pay at the time of your lab draw.  You may receive a bill from [facility] for your lab work. ? ?Please note if you are on Hydroxychloroquine and and an order has been placed for a Hydroxychloroquine level, you will need to have it drawn 4 hours or more after your last dose. ? ?If you wish to have your labs drawn at another location, please call the office 24 hours in advance to send orders. ? ?If you have any questions regarding directions or hours of operation,  ?please call [phone].   ?As a reminder, please drink plenty of water prior to coming for your lab work. Thanks!  ? ?Vaccines ?You are taking a medication(s) that can suppress your immune system.  The following immunizations are recommended: ?Flu annually ?Covid-19  ?Td/Tdap (tetanus, diphtheria, pertussis)  every 10 years ?Pneumonia (Prevnar 15 then Pneumovax 23 at least 1 year apart.  Alternatively, can take Prevnar 20 without needing additional dose) ?Shingrix: 2 doses from 4 weeks to 6 months apart ? ?Please check with your PCP to make sure you are up to date.  ?

## 2021-07-10 DIAGNOSIS — R42 Dizziness and giddiness: Secondary | ICD-10-CM | POA: Diagnosis not present

## 2021-07-10 DIAGNOSIS — I6522 Occlusion and stenosis of left carotid artery: Secondary | ICD-10-CM | POA: Diagnosis not present

## 2021-08-17 DIAGNOSIS — Z0000 Encounter for general adult medical examination without abnormal findings: Secondary | ICD-10-CM | POA: Diagnosis not present

## 2021-08-17 DIAGNOSIS — I10 Essential (primary) hypertension: Secondary | ICD-10-CM | POA: Diagnosis not present

## 2021-08-17 DIAGNOSIS — Z6832 Body mass index (BMI) 32.0-32.9, adult: Secondary | ICD-10-CM | POA: Diagnosis not present

## 2021-08-17 DIAGNOSIS — E6609 Other obesity due to excess calories: Secondary | ICD-10-CM | POA: Diagnosis not present

## 2021-08-17 DIAGNOSIS — Z1339 Encounter for screening examination for other mental health and behavioral disorders: Secondary | ICD-10-CM | POA: Diagnosis not present

## 2021-08-17 DIAGNOSIS — Z7189 Other specified counseling: Secondary | ICD-10-CM | POA: Diagnosis not present

## 2021-08-17 DIAGNOSIS — E1165 Type 2 diabetes mellitus with hyperglycemia: Secondary | ICD-10-CM | POA: Diagnosis not present

## 2021-08-17 DIAGNOSIS — Z1331 Encounter for screening for depression: Secondary | ICD-10-CM | POA: Diagnosis not present

## 2021-08-17 DIAGNOSIS — Z299 Encounter for prophylactic measures, unspecified: Secondary | ICD-10-CM | POA: Diagnosis not present

## 2021-08-28 ENCOUNTER — Other Ambulatory Visit: Payer: Self-pay | Admitting: *Deleted

## 2021-08-28 DIAGNOSIS — M48062 Spinal stenosis, lumbar region with neurogenic claudication: Secondary | ICD-10-CM | POA: Diagnosis not present

## 2021-08-28 DIAGNOSIS — Z79899 Other long term (current) drug therapy: Secondary | ICD-10-CM | POA: Diagnosis not present

## 2021-08-28 DIAGNOSIS — R5383 Other fatigue: Secondary | ICD-10-CM | POA: Diagnosis not present

## 2021-08-28 DIAGNOSIS — M48061 Spinal stenosis, lumbar region without neurogenic claudication: Secondary | ICD-10-CM | POA: Diagnosis not present

## 2021-08-28 DIAGNOSIS — E118 Type 2 diabetes mellitus with unspecified complications: Secondary | ICD-10-CM | POA: Diagnosis not present

## 2021-08-29 LAB — COMPLETE METABOLIC PANEL WITH GFR
AG Ratio: 2.1 (calc) (ref 1.0–2.5)
ALT: 8 U/L (ref 6–29)
AST: 9 U/L — ABNORMAL LOW (ref 10–35)
Albumin: 4.2 g/dL (ref 3.6–5.1)
Alkaline phosphatase (APISO): 67 U/L (ref 37–153)
BUN/Creatinine Ratio: 14 (calc) (ref 6–22)
BUN: 15 mg/dL (ref 7–25)
CO2: 20 mmol/L (ref 20–32)
Calcium: 9 mg/dL (ref 8.6–10.4)
Chloride: 111 mmol/L — ABNORMAL HIGH (ref 98–110)
Creat: 1.07 mg/dL — ABNORMAL HIGH (ref 0.50–1.05)
Globulin: 2 g/dL (calc) (ref 1.9–3.7)
Glucose, Bld: 90 mg/dL (ref 65–99)
Potassium: 4.1 mmol/L (ref 3.5–5.3)
Sodium: 141 mmol/L (ref 135–146)
Total Bilirubin: 0.3 mg/dL (ref 0.2–1.2)
Total Protein: 6.2 g/dL (ref 6.1–8.1)
eGFR: 56 mL/min/{1.73_m2} — ABNORMAL LOW (ref >=60)

## 2021-08-29 LAB — CBC WITH DIFFERENTIAL/PLATELET
Absolute Monocytes: 480 cells/uL (ref 200–950)
Basophils Absolute: 38 cells/uL (ref 0–200)
Basophils Relative: 0.5 %
Eosinophils Absolute: 60 cells/uL (ref 15–500)
Eosinophils Relative: 0.8 %
HCT: 41 % (ref 35.0–45.0)
Hemoglobin: 13.7 g/dL (ref 11.7–15.5)
Lymphs Abs: 1665 cells/uL (ref 850–3900)
MCH: 32.4 pg (ref 27.0–33.0)
MCHC: 33.4 g/dL (ref 32.0–36.0)
MCV: 96.9 fL (ref 80.0–100.0)
MPV: 9.6 fL (ref 7.5–12.5)
Monocytes Relative: 6.4 %
Neutro Abs: 5258 cells/uL (ref 1500–7800)
Neutrophils Relative %: 70.1 %
Platelets: 168 10*3/uL (ref 140–400)
RBC: 4.23 10*6/uL (ref 3.80–5.10)
RDW: 14.2 % (ref 11.0–15.0)
Total Lymphocyte: 22.2 %
WBC: 7.5 10*3/uL (ref 3.8–10.8)

## 2021-08-29 LAB — LIPID PANEL
Cholesterol: 163 mg/dL (ref <200)
HDL: 61 mg/dL (ref >=50)
LDL Cholesterol (Calc): 78 mg/dL (calc)
Non-HDL Cholesterol (Calc): 102 mg/dL (calc) (ref <130)
Total CHOL/HDL Ratio: 2.7 (calc) (ref <5.0)
Triglycerides: 147 mg/dL (ref <150)

## 2021-08-29 LAB — TSH: TSH: 1.41 mIU/L (ref 0.40–4.50)

## 2021-08-29 NOTE — Progress Notes (Signed)
Creatinine is elevated and stable.  CBC is normal.

## 2021-08-29 NOTE — Progress Notes (Signed)
Lipid panel WNL.   TSH WNL.

## 2021-09-20 DIAGNOSIS — Z6832 Body mass index (BMI) 32.0-32.9, adult: Secondary | ICD-10-CM | POA: Diagnosis not present

## 2021-09-20 DIAGNOSIS — M25561 Pain in right knee: Secondary | ICD-10-CM | POA: Diagnosis not present

## 2021-09-20 DIAGNOSIS — F17210 Nicotine dependence, cigarettes, uncomplicated: Secondary | ICD-10-CM | POA: Diagnosis not present

## 2021-09-20 DIAGNOSIS — Z299 Encounter for prophylactic measures, unspecified: Secondary | ICD-10-CM | POA: Diagnosis not present

## 2021-09-20 DIAGNOSIS — I10 Essential (primary) hypertension: Secondary | ICD-10-CM | POA: Diagnosis not present

## 2021-09-26 DIAGNOSIS — F3112 Bipolar disorder, current episode manic without psychotic features, moderate: Secondary | ICD-10-CM | POA: Diagnosis not present

## 2021-11-21 DIAGNOSIS — F17210 Nicotine dependence, cigarettes, uncomplicated: Secondary | ICD-10-CM | POA: Diagnosis not present

## 2021-11-21 DIAGNOSIS — Z23 Encounter for immunization: Secondary | ICD-10-CM | POA: Diagnosis not present

## 2021-11-21 DIAGNOSIS — R600 Localized edema: Secondary | ICD-10-CM | POA: Diagnosis not present

## 2021-11-21 DIAGNOSIS — Z299 Encounter for prophylactic measures, unspecified: Secondary | ICD-10-CM | POA: Diagnosis not present

## 2021-11-21 DIAGNOSIS — Z6832 Body mass index (BMI) 32.0-32.9, adult: Secondary | ICD-10-CM | POA: Diagnosis not present

## 2021-11-21 DIAGNOSIS — I10 Essential (primary) hypertension: Secondary | ICD-10-CM | POA: Diagnosis not present

## 2021-11-21 DIAGNOSIS — M1710 Unilateral primary osteoarthritis, unspecified knee: Secondary | ICD-10-CM | POA: Diagnosis not present

## 2021-11-21 DIAGNOSIS — E1165 Type 2 diabetes mellitus with hyperglycemia: Secondary | ICD-10-CM | POA: Diagnosis not present

## 2021-11-23 NOTE — Progress Notes (Signed)
Office Visit Note  Patient: Margaret [name]             Date of Birth: 1950-08-09           MRN: [id number]             PCP: Monico Blitz, MD Referring: Monico Blitz, MD Visit Date: 12/06/2021 Occupation: @GUAROCC @  Subjective:  Arthralgias   History of Present Illness: Margaret [name] is a 71 y.o. female with history of seropositive rheumatoid arthritis, osteoarthritis, and DDD.  She is taking Sulfasalazine 500 mg 2 tablets in the morning and 1 tablet in the evening.  She is tolerating sulfasalazine without any side effects and has not missed any doses recently.  She denies any signs or symptoms of a rheumatoid arthritis flare.  She experiences intermittent pain and stiffness in her neck especially in the trapezius muscles bilaterally.  She states that she has been released by Dr. Kathyrn Sheriff after her lumbar fusion.  She experiences intermittent pain in both hands especially in the [facility] joints but denies any joint swelling.  She states that a couple months ago she had a right knee joint cortisone injection performed by her PCP Dr. Brigitte Pulse which has alleviated her discomfort.  She denies any inflammation in her knee joints currently.  She has noticed some swelling intermittently in her ankles but does not think that it is in the joints. She denies any recent infections.  She has had the annual flu shot.  She is planning on getting the COVID-19 booster.    Activities of Daily Living:  Patient reports morning stiffness for a few minutes.   Patient Reports nocturnal pain.  Difficulty dressing/grooming: Denies Difficulty climbing stairs: Denies Difficulty getting out of chair: Denies Difficulty using hands for taps, buttons, cutlery, and/or writing: Denies  Review of Systems  Constitutional:  Positive for fatigue.  HENT:  Positive for mouth dryness. Negative for mouth sores.   Eyes:  Positive for dryness.  Respiratory:  Negative for shortness of breath.   Cardiovascular:  Negative for chest  pain and palpitations.  Gastrointestinal:  Positive for constipation. Negative for blood in stool and diarrhea.  Endocrine: Negative for increased urination.  Genitourinary:  Negative for involuntary urination.  Musculoskeletal:  Positive for joint pain, joint pain, myalgias, morning stiffness, muscle tenderness and myalgias. Negative for gait problem, joint swelling and muscle weakness.  Skin:  Positive for color change. Negative for rash, hair loss and sensitivity to sunlight.  Allergic/Immunologic: Negative for susceptible to infections.  Neurological:  Negative for dizziness and headaches.  Hematological:  Negative for swollen glands.  Psychiatric/Behavioral:  Positive for depressed mood and sleep disturbance. The patient is nervous/anxious.     PMFS History:  Patient Active Problem List   Diagnosis Date Noted   Lumbar stenosis with neurogenic claudication 05/09/2021   Idiopathic chronic venous hypertension of both lower extremities with inflammation 06/18/2017   Closed displaced oblique fracture of shaft of right humerus 03/27/2017   Smoker 01/09/2016   High risk medication use 01/06/2016   RA (rheumatoid arthritis) ([place]) 01/05/2016   DDD (degenerative disc disease), lumbar 01/05/2016   Calcaneal spur 01/05/2016   Bipolar disorder ([place]) 01/05/2016   ADD (attention deficit disorder) 01/05/2016   Sleep apnea 01/05/2016   TMJ (dislocation of temporomandibular joint) 01/05/2016   Pedal edema 01/05/2016   Osteoarthritis of both hands 01/05/2016   Osteoarthritis of both feet 01/05/2016   Osteoarthritis of both knees 01/05/2016   GERD (gastroesophageal reflux disease) 06/24/2015  Constipation 06/24/2015   Spondylolisthesis at L4-L5 level 01/24/2015   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   CONTRACTURE OF SHOULDER JOINT 04/24/2007   DIABETES 07/30/2006    Past Medical History:  Diagnosis Date   ADD (attention deficit disorder) 01/05/2016   Anemia    Arthritis    Asthma    Benign  essential hypertension    Bipolar disorder ([place])    hospitalized at [facility] for manic episodes   Calcaneal spur 01/05/2016   Chronic kidney disease    stage 3 per pt   DDD (degenerative disc disease), lumbar 01/05/2016   Diabetes mellitus without complication ([place])    Diabetic neuropathy ([place])    Dysrhythmia    takes diltiazem prescribed by medical doctor; has never seen cardiologist. Pt states she was having "flutter" episodes but nothing showed on holter monitor per pt   Eczema    Gastroesophageal reflux disease    Humeral shaft fracture    right   Hyperlipidemia    Hypersomnia    IBS (irritable bowel syndrome)    Myalgia    Noncompliance with CPAP treatment    Osteoarthritis of both feet 01/05/2016   Osteoarthritis of both hands 01/05/2016   Osteoarthritis of both knees 01/05/2016   Pedal edema 01/05/2016   Peripheral vascular disease ([facility])    RA (rheumatoid arthritis) ([place]) 01/05/2016   +RF, +CCP, Nodulous,    Rheumatoid arthritis ([place])    Sleep apnea 2015   home sleep study    TMJ (dislocation of temporomandibular joint) 01/05/2016   Varicose veins     Family History  Problem Relation Age of Onset   Hypertension Father    Alcoholism Mother    Stroke Mother    AAA (abdominal aortic aneurysm) Brother    Gallstones Sister    Osteoporosis Sister    Alcoholism Brother    Diabetes Brother    Healthy Son    Healthy Daughter    Colon cancer Neg Hx    Colon polyps Neg Hx    Past Surgical History:  Procedure Laterality Date   BACK SURGERY     BACK SURGERY  05/09/2021   CESAREAN SECTION     COLONOSCOPY  02/05/2009   Dr. Collene Mares in [place]   ELBOW SURGERY     rheumatoid nodules removed   ENDOVENOUS ABLATION SAPHENOUS VEIN W/ LASER     ORIF HUMERUS FRACTURE Right 03/29/2017   Procedure: OPEN REDUCTION INTERNAL FIXATION (ORIF) RIGHT HUMERAL SHAFT FRACTURE;  Surgeon: Newt Minion, MD;  Location: [place];  Service: Orthopedics;  Laterality: Right;   Social History    Social History Narrative   Not on file   Immunization History  Administered Date(s) Administered   Influenza, High Dose Seasonal PF 12/19/2017   Moderna Sars-Covid-2 Vaccination 02/26/2019, 04/03/2019   Tdap 12/19/2017   Zoster Recombinat (Shingrix) 12/19/2017, 03/03/2018     Objective: Vital Signs: BP 118/79 (BP Location: Left Arm, Patient Position: Sitting, Cuff Size: Normal)   Pulse 83   Resp 17   Ht 5' 2.5" (1.588 m)   Wt 189 lb (85.7 kg)   BMI 34.02 kg/m    Physical Exam Vitals and nursing note reviewed.  Constitutional:      Appearance: She is well-developed.  HENT:     Head: Normocephalic and atraumatic.  Eyes:     Conjunctiva/sclera: Conjunctivae normal.  Cardiovascular:     Rate and Rhythm: Normal rate and regular rhythm.     Heart sounds: Normal heart sounds.  Pulmonary:  Effort: Pulmonary effort is normal.     Breath sounds: Normal breath sounds.  Abdominal:     General: Bowel sounds are normal.     Palpations: Abdomen is soft.  Musculoskeletal:     Cervical back: Normal range of motion.  Skin:    General: Skin is warm and dry.     Capillary Refill: Capillary refill takes less than 2 seconds.  Neurological:     Mental Status: She is alert and oriented to person, place, and time.  Psychiatric:        Behavior: Behavior normal.      Musculoskeletal Exam: C-spine has slightly limited ROM with lateral rotation.  Trapezius muscle tension and tenderness bilaterally.  No midline spinal tenderness at this time.  Shoulder joints, elbow joints, wrist joints, Mcps, PIPs, DIPs have good ROM with no discomfort.  Complete fist formation bilaterally.  Hip joints, knee joints, and ankle joints have good ROM.  Some tenderness over the left trochanteric bursa.  No warmth or effusion of knee joints.  No tenderness or swelling of ankle joints.    CDAI Exam: CDAI Score: -- Patient Global: 6 mm; Provider Global: 2 mm Swollen: --; Tender: -- Joint Exam 12/06/2021    No joint exam has been documented for this visit   There is currently no information documented on the homunculus. Go to the Rheumatology activity and complete the homunculus joint exam.  Investigation: No additional findings.  Imaging: No results found.  Recent Labs: Lab Results  Component Value Date   WBC 7.5 08/28/2021   HGB 13.7 08/28/2021   PLT 168 08/28/2021   NA 141 08/28/2021   K 4.1 08/28/2021   CL 111 (H) 08/28/2021   CO2 20 08/28/2021   GLUCOSE 90 08/28/2021   BUN 15 08/28/2021   CREATININE 1.07 (H) 08/28/2021   BILITOT 0.3 08/28/2021   ALKPHOS 87 01/11/2020   AST 9 (L) 08/28/2021   ALT 8 08/28/2021   PROT 6.2 08/28/2021   ALBUMIN 4.3 01/11/2020   CALCIUM 9.0 08/28/2021   GFRAA 54 (L) 03/29/2020    Speciality Comments: No specialty comments available.  Procedures:  No procedures performed Allergies: Plaquenil [hydroxychloroquine], Hydrocodone, and Prozac [fluoxetine]   Assessment / Plan:     Visit Diagnoses: Rheumatoid arthritis involving multiple sites with positive rheumatoid factor ([place]): She has no joint tenderness or synovitis on examination today.  She has not had any signs or symptoms of a rheumatoid arthritis flare.  She has clinically been doing well on sulfasalazine 500 mg 2 tablets in the morning and 1 tablet in the evening.  She is tolerating sulfasalazine without any side effects and has not missed any doses recently.  Her morning stiffness has been lasting a few minutes daily.  She has not had any difficulty with ADLs.  She will remain on sulfasalazine as monotherapy.  She was advised to notify us if she develops signs or symptoms of a flare.  She will follow-up in the office in 5 months or sooner if needed.  High risk medication use - Sulfasalazine 500 mg 2 tablets in the morning and 1 tablet in the evening.  CBC and CMP updated on 08/28/21. Orders for CBC and CMP released today.  Her next lab work will be due in February and every 3 months.   Standing orders for CBC and CMP were placed today. She has not had any recent or recurrent infections.  Discussed the importance of holding sulfasalazine if she develops signs or symptoms of  an infection and to resume once the infection has completely cleared.  She voiced understanding. She has received the annual flu shot.  She is planning on getting the COVID booster. - Plan: CBC with Differential/Platelet, COMPLETE METABOLIC PANEL WITH GFR, CBC with Differential/Platelet, COMPLETE METABOLIC PANEL WITH GFR She denies any new medical conditions.  She denies any recent hospitalizations.  Primary osteoarthritis of both hands: She experiences intermittent discomfort in bilateral CMC joints.  On examination she has no tenderness or inflammation at this time.  Primary osteoarthritis of both knees - She was evaluated by Dr. French Ana and proceeded with a MRI of the right knee on 12/20/2019.  The MRI was unremarkable overall.  She had a right knee joint cortisone injection performed 2 to 3 months ago by Dr. Manuella Ghazi.  She has good ROM of both knee joints with no warmth or effusion.   Primary osteoarthritis of both feet: She is not experiencing any increased discomfort in her feet at this time.  No tenderness or synovitis over ankle joints.   Trochanteric bursitis of both hips: Some tenderness over the left trochanteric bursa.    DDD (degenerative disc disease), lumbar - L3-L4 decompression and extension of fusion in April, 2023 by Dr. Kathyrn Sheriff.  Status post L4-L5 PLIF 2016 by Dr. Kathyrn Sheriff.  She has been released by Dr. Kathyrn Sheriff.    Other medical conditions are listed as follows:   Osteopenia of multiple sites - DEXA ordered by PCP.   History of humerus fracture  Stage 3a chronic kidney disease ([place]) - She is followed  at [facility].    Essential hypertension: BP was 118/79 today in the office.   Nail dystrophy  History of IBS  History of asthma  History of diabetes mellitus  Attention  deficit disorder, unspecified hyperactivity presence  History of bipolar disorder  History of gastroesophageal reflux (GERD)  COVID-19 virus infection - June 2022  Smoker  Orders: Orders Placed This Encounter  Procedures   CBC with Differential/Platelet   COMPLETE METABOLIC PANEL WITH GFR   CBC with Differential/Platelet   COMPLETE METABOLIC PANEL WITH GFR   No orders of the defined types were placed in this encounter.   Follow-Up Instructions: Return in about 5 months (around 05/07/2022) for Rheumatoid arthritis, Osteoarthritis, DDD.   Ofilia Neas, PA-C  Note - This record has been created using Dragon software.  Chart creation errors have been sought, but may not always  have been located. Such creation errors do not reflect on  the standard of medical care.

## 2021-12-06 ENCOUNTER — Ambulatory Visit: Payer: Medicare PPO | Attending: Physician Assistant | Admitting: Physician Assistant

## 2021-12-06 ENCOUNTER — Encounter: Payer: Self-pay | Admitting: Physician Assistant

## 2021-12-06 VITALS — BP 118/79 | HR 83 | Resp 17 | Ht 62.5 in | Wt 189.0 lb

## 2021-12-06 DIAGNOSIS — M19042 Primary osteoarthritis, left hand: Secondary | ICD-10-CM

## 2021-12-06 DIAGNOSIS — M19041 Primary osteoarthritis, right hand: Secondary | ICD-10-CM

## 2021-12-06 DIAGNOSIS — U071 COVID-19: Secondary | ICD-10-CM

## 2021-12-06 DIAGNOSIS — Z8781 Personal history of (healed) traumatic fracture: Secondary | ICD-10-CM

## 2021-12-06 DIAGNOSIS — Z8719 Personal history of other diseases of the digestive system: Secondary | ICD-10-CM

## 2021-12-06 DIAGNOSIS — M5136 Other intervertebral disc degeneration, lumbar region: Secondary | ICD-10-CM | POA: Diagnosis not present

## 2021-12-06 DIAGNOSIS — M0579 Rheumatoid arthritis with rheumatoid factor of multiple sites without organ or systems involvement: Secondary | ICD-10-CM | POA: Diagnosis not present

## 2021-12-06 DIAGNOSIS — I10 Essential (primary) hypertension: Secondary | ICD-10-CM

## 2021-12-06 DIAGNOSIS — M7061 Trochanteric bursitis, right hip: Secondary | ICD-10-CM

## 2021-12-06 DIAGNOSIS — Z8639 Personal history of other endocrine, nutritional and metabolic disease: Secondary | ICD-10-CM

## 2021-12-06 DIAGNOSIS — M170 Bilateral primary osteoarthritis of knee: Secondary | ICD-10-CM | POA: Diagnosis not present

## 2021-12-06 DIAGNOSIS — Z8709 Personal history of other diseases of the respiratory system: Secondary | ICD-10-CM

## 2021-12-06 DIAGNOSIS — F17200 Nicotine dependence, unspecified, uncomplicated: Secondary | ICD-10-CM

## 2021-12-06 DIAGNOSIS — M8589 Other specified disorders of bone density and structure, multiple sites: Secondary | ICD-10-CM

## 2021-12-06 DIAGNOSIS — M19071 Primary osteoarthritis, right ankle and foot: Secondary | ICD-10-CM | POA: Diagnosis not present

## 2021-12-06 DIAGNOSIS — Z8659 Personal history of other mental and behavioral disorders: Secondary | ICD-10-CM

## 2021-12-06 DIAGNOSIS — M7062 Trochanteric bursitis, left hip: Secondary | ICD-10-CM

## 2021-12-06 DIAGNOSIS — N1831 Chronic kidney disease, stage 3a: Secondary | ICD-10-CM

## 2021-12-06 DIAGNOSIS — M19072 Primary osteoarthritis, left ankle and foot: Secondary | ICD-10-CM

## 2021-12-06 DIAGNOSIS — Z79899 Other long term (current) drug therapy: Secondary | ICD-10-CM | POA: Diagnosis not present

## 2021-12-06 DIAGNOSIS — F988 Other specified behavioral and emotional disorders with onset usually occurring in childhood and adolescence: Secondary | ICD-10-CM

## 2021-12-06 DIAGNOSIS — M51369 Other intervertebral disc degeneration, lumbar region without mention of lumbar back pain or lower extremity pain: Secondary | ICD-10-CM

## 2021-12-06 DIAGNOSIS — L603 Nail dystrophy: Secondary | ICD-10-CM

## 2021-12-06 NOTE — Patient Instructions (Addendum)
Standing Labs We placed an order today for your standing lab work.   Please have your standing labs drawn in February and every 3 months   Please have your labs drawn 2 weeks prior to your appointment so that the provider can discuss your lab results at your appointment.  Please note that you may see your imaging and lab results in MyChart before we have reviewed them. We will contact you once all results are reviewed. Please allow our office up to 72 hours to thoroughly review all of the results before contacting the office for clarification of your results.  Lab hours are:   Monday through Thursday from 8:00 am -12:30 pm and 1:00 pm-5:00 pm and Friday from 8:00 am-12:00 pm.  Please be advised, all patients with office appointments requiring lab work will take precedent over walk-in lab work.   Labs are drawn by [facility]. Please bring your co-pay at the time of your lab draw.  You may receive a bill from [facility] for your lab work.  Please note if you are on Hydroxychloroquine and and an order has been placed for a Hydroxychloroquine level, you will need to have it drawn 4 hours or more after your last dose.  If you wish to have your labs drawn at another location, please call the office 24 hours in advance so we can fax the orders.  The office is located at [address], [address], , [place] [address] No appointment is necessary.    If you have any questions regarding directions or hours of operation,  please call [phone].   As a reminder, please drink plenty of water prior to coming for your lab work. Thanks!  If you have signs or symptoms of an infection or start antibiotics: First, call your PCP for workup of your infection. Hold your medication through the infection, until you complete your antibiotics, and until symptoms resolve if you take the following: Injectable medication (Actemra, Benlysta, Cimzia, Cosentyx, Enbrel, Humira, Kevzara, Orencia, Remicade, Simponi,  Stelara, Taltz, Tremfya) Methotrexate Leflunomide (Arava) Mycophenolate (Cellcept) Xeljanz, Olumiant, or Rinvoq Vaccines You are taking a medication(s) that can suppress your immune system.  The following immunizations are recommended: Flu annually Covid-19  Td/Tdap (tetanus, diphtheria, pertussis) every 10 years Pneumonia (Prevnar 15 then Pneumovax 23 at least 1 year apart.  Alternatively, can take Prevnar 20 without needing additional dose) Shingrix: 2 doses from 4 weeks to 6 months apart  Please check with your PCP to make sure you are up to date.   

## 2021-12-07 LAB — CBC WITH DIFFERENTIAL/PLATELET
Absolute Monocytes: 510 cells/uL (ref 200–950)
Basophils Absolute: 38 cells/uL (ref 0–200)
Basophils Relative: 0.5 %
Eosinophils Absolute: 98 cells/uL (ref 15–500)
Eosinophils Relative: 1.3 %
HCT: 42.8 % (ref 35.0–45.0)
Hemoglobin: 14 g/dL (ref 11.7–15.5)
Lymphs Abs: 2228 cells/uL (ref 850–3900)
MCH: 32.7 pg (ref 27.0–33.0)
MCHC: 32.7 g/dL (ref 32.0–36.0)
MCV: 100 fL (ref 80.0–100.0)
MPV: 9.1 fL (ref 7.5–12.5)
Monocytes Relative: 6.8 %
Neutro Abs: 4628 cells/uL (ref 1500–7800)
Neutrophils Relative %: 61.7 %
Platelets: 218 10*3/uL (ref 140–400)
RBC: 4.28 10*6/uL (ref 3.80–5.10)
RDW: 13.6 % (ref 11.0–15.0)
Total Lymphocyte: 29.7 %
WBC: 7.5 10*3/uL (ref 3.8–10.8)

## 2021-12-07 LAB — COMPLETE METABOLIC PANEL WITH GFR
AG Ratio: 2 (calc) (ref 1.0–2.5)
ALT: 8 U/L (ref 6–29)
AST: 10 U/L (ref 10–35)
Albumin: 4.3 g/dL (ref 3.6–5.1)
Alkaline phosphatase (APISO): 61 U/L (ref 37–153)
BUN/Creatinine Ratio: 10 (calc) (ref 6–22)
BUN: 12 mg/dL (ref 7–25)
CO2: 22 mmol/L (ref 20–32)
Calcium: 9.1 mg/dL (ref 8.6–10.4)
Chloride: 110 mmol/L (ref 98–110)
Creat: 1.24 mg/dL — ABNORMAL HIGH (ref 0.50–1.05)
Globulin: 2.1 g/dL (calc) (ref 1.9–3.7)
Glucose, Bld: 62 mg/dL — ABNORMAL LOW (ref 65–99)
Potassium: 4.1 mmol/L (ref 3.5–5.3)
Sodium: 142 mmol/L (ref 135–146)
Total Bilirubin: 0.3 mg/dL (ref 0.2–1.2)
Total Protein: 6.4 g/dL (ref 6.1–8.1)
eGFR: 47 mL/min/{1.73_m2} — ABNORMAL LOW (ref >=60)

## 2021-12-07 NOTE — Progress Notes (Signed)
CBC WNL.  Creatinine elevated-1.24 and GFR low-47. Please advise patient to avoid the use of NSAIDs.  Rest of CMP WNL.

## 2022-01-11 ENCOUNTER — Other Ambulatory Visit: Payer: Self-pay | Admitting: Internal Medicine

## 2022-01-11 DIAGNOSIS — Z1231 Encounter for screening mammogram for malignant neoplasm of breast: Secondary | ICD-10-CM

## 2022-01-15 ENCOUNTER — Ambulatory Visit
Admission: RE | Admit: 2022-01-15 | Discharge: 2022-01-15 | Disposition: A | Discharge status: Home / Self Care | Payer: Medicare PPO | Source: Ambulatory Visit

## 2022-01-15 DIAGNOSIS — Z1231 Encounter for screening mammogram for malignant neoplasm of breast: Secondary | ICD-10-CM | POA: Diagnosis not present

## 2022-01-17 DIAGNOSIS — F3112 Bipolar disorder, current episode manic without psychotic features, moderate: Secondary | ICD-10-CM | POA: Diagnosis not present

## 2022-01-18 DIAGNOSIS — K1120 Sialoadenitis, unspecified: Secondary | ICD-10-CM | POA: Diagnosis not present

## 2022-01-18 DIAGNOSIS — Z299 Encounter for prophylactic measures, unspecified: Secondary | ICD-10-CM | POA: Diagnosis not present

## 2022-01-18 DIAGNOSIS — I10 Essential (primary) hypertension: Secondary | ICD-10-CM | POA: Diagnosis not present

## 2022-01-24 ENCOUNTER — Other Ambulatory Visit: Payer: Self-pay | Admitting: Physician Assistant

## 2022-01-25 NOTE — Telephone Encounter (Signed)
Next Visit: 05/08/2022  Last Visit: 12/06/2021  Last Fill: 03/15/2021  DX: Rheumatoid arthritis involving multiple sites with positive rheumatoid factor   Current Dose per office note 12/06/2021:  Sulfasalazine 500 mg 2 tablets in the morning and 1 tablet in the evening.    Labs: 12/06/2021 CBC WNL. Creatinine elevated-1.24 and GFR low-47. Rest of CMP WNL.   Okay to refill SSZ?

## 2022-01-31 DIAGNOSIS — Z7984 Long term (current) use of oral hypoglycemic drugs: Secondary | ICD-10-CM | POA: Diagnosis not present

## 2022-01-31 DIAGNOSIS — Z794 Long term (current) use of insulin: Secondary | ICD-10-CM | POA: Diagnosis not present

## 2022-01-31 DIAGNOSIS — H2513 Age-related nuclear cataract, bilateral: Secondary | ICD-10-CM | POA: Diagnosis not present

## 2022-01-31 DIAGNOSIS — E119 Type 2 diabetes mellitus without complications: Secondary | ICD-10-CM | POA: Diagnosis not present

## 2022-02-23 DIAGNOSIS — K5900 Constipation, unspecified: Secondary | ICD-10-CM | POA: Diagnosis not present

## 2022-02-23 DIAGNOSIS — Z6831 Body mass index (BMI) 31.0-31.9, adult: Secondary | ICD-10-CM | POA: Diagnosis not present

## 2022-02-23 DIAGNOSIS — F17210 Nicotine dependence, cigarettes, uncomplicated: Secondary | ICD-10-CM | POA: Diagnosis not present

## 2022-02-23 DIAGNOSIS — I10 Essential (primary) hypertension: Secondary | ICD-10-CM | POA: Diagnosis not present

## 2022-02-23 DIAGNOSIS — M1710 Unilateral primary osteoarthritis, unspecified knee: Secondary | ICD-10-CM | POA: Diagnosis not present

## 2022-02-23 DIAGNOSIS — E1165 Type 2 diabetes mellitus with hyperglycemia: Secondary | ICD-10-CM | POA: Diagnosis not present

## 2022-02-23 DIAGNOSIS — E1122 Type 2 diabetes mellitus with diabetic chronic kidney disease: Secondary | ICD-10-CM | POA: Diagnosis not present

## 2022-02-23 DIAGNOSIS — Z299 Encounter for prophylactic measures, unspecified: Secondary | ICD-10-CM | POA: Diagnosis not present

## 2022-03-05 DIAGNOSIS — H01004 Unspecified blepharitis left upper eyelid: Secondary | ICD-10-CM | POA: Diagnosis not present

## 2022-03-05 DIAGNOSIS — H01001 Unspecified blepharitis right upper eyelid: Secondary | ICD-10-CM | POA: Diagnosis not present

## 2022-03-05 DIAGNOSIS — H25813 Combined forms of age-related cataract, bilateral: Secondary | ICD-10-CM | POA: Diagnosis not present

## 2022-03-05 DIAGNOSIS — H01002 Unspecified blepharitis right lower eyelid: Secondary | ICD-10-CM | POA: Diagnosis not present

## 2022-03-15 DIAGNOSIS — H25811 Combined forms of age-related cataract, right eye: Secondary | ICD-10-CM | POA: Diagnosis not present

## 2022-03-16 NOTE — H&P (Signed)
Surgical History & Physical  Patient Name: Margaret [name] DOB: [birth year]  Surgery: Cataract extraction with intraocular lens implant phacoemulsification; Right Eye  Surgeon: Margaret Goldmann MD Surgery Date:  03-23-22 Pre-Op Date:  03-05-22  HPI: A 45 Yr. old female patient referred by Dr. Hassell [name]. 1. 1. The patient complains of difficulty when driving due to glare from headlights or sun, which began 2 years ago. Both eyes are affected. The episode is gradual. This is negatively affecting the patient's quality of life and the patient is unable to function adequately in life with the current level of vision. HPI was performed by Margaret [name] .  Medical History: Dry Eyes Cataracts macular drusen ADHD, bipolar, acid reflux Arthritis Diabetes Heart Problem High Blood Pressure  Review of Systems Cardiovascular High Blood Pressure, A-fib Musculoskeletal Pain Psychiatry bipolar, ADHD All recorded systems are negative except as noted above.  Social   Current every day smoker / Cigarettes   Medication  Alprazolam, Wellbutrin, Diltiazem, Gabapentin, Lisinopril, Sodium bicarbonate, Sulfasalazine, Topamax, Ozempic, Toujeo, Protonix, Strattera, Amaryl, Novalog flex pen,   Sx/Procedures Back Surgery x2, Upper Arm surgery, Nodules on elbows removed, C Section,   Drug Allergies  Vicodin, Prozac, Plaquenil,   History & Physical: Heent: cataract, right eye NECK: supple without bruits LUNGS: lungs clear to auscultation CV: regular rate and rhythm Abdomen: soft and non-tender Impression & Plan: Assessment: 1.  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  Myopia ; Both Eyes (H52.13) 3.  BLEPHARITIS; Right Upper Lid, Right Lower Lid, Left Upper Lid, Left Lower Lid (H01.001, H01.002,H01.004,H01.005) 4.  DERMATOCHALASIS, no surgery; Right Upper Lid, Left Upper Lid (H02.831, H02.834) 5.  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  Cataract accounts for the patient's  decreased vision. This visual impairment is not correctable with a tolerable change in glasses or contact lenses. Cataract surgery with an implantation of a new lens should significantly improve the visual and functional status of the patient. Discussed all risks, benefits, alternatives, and potential complications. Discussed the procedures and recovery. Patient desires to have surgery. A-scan ordered and performed today for intra-ocular lens calculations. The surgery will be performed in order to improve vision for driving, reading, and for eye examinations. Recommend phacoemulsification with intra-ocular lens. Recommend Dextenza for post-operative pain and inflammation. Right Eye non-dominant - first.. Dilates poorly - shugarcaine by protocol. Malyugin Ring. Omidira. Conisder Vivity IOL. Patient will consider setting for near or distance. If goal is plano OU and patient elects toric OS, use DIB00 OD.  2.  Patient will consider setting goal for plano or -2.50.  3.  Recommend regular lid cleaning.  4.  Asymptomatic, recommend observation for now. Findings, prognosis and treatment options reviewed.  5.  Recommend toric IOL OS only.

## 2022-03-22 ENCOUNTER — Encounter (HOSPITAL_COMMUNITY)
Admission: RE | Admit: 2022-03-22 | Discharge: 2022-03-22 | Disposition: A | Discharge status: Home / Self Care | Payer: Medicare PPO | Source: Ambulatory Visit | Attending: Ophthalmology | Admitting: Ophthalmology

## 2022-03-22 ENCOUNTER — Encounter (HOSPITAL_COMMUNITY): Payer: Self-pay

## 2022-03-23 ENCOUNTER — Ambulatory Visit (HOSPITAL_COMMUNITY): Payer: Medicare PPO | Admitting: Anesthesiology

## 2022-03-23 ENCOUNTER — Ambulatory Visit (HOSPITAL_BASED_OUTPATIENT_CLINIC_OR_DEPARTMENT_OTHER): Payer: Medicare PPO | Admitting: Anesthesiology

## 2022-03-23 ENCOUNTER — Encounter (HOSPITAL_COMMUNITY): Payer: Self-pay | Admitting: Ophthalmology

## 2022-03-23 ENCOUNTER — Ambulatory Visit (HOSPITAL_COMMUNITY)
Admission: RE | Admit: 2022-03-23 | Discharge: 2022-03-23 | Disposition: A | Discharge status: Home / Self Care | Payer: Medicare PPO | Source: Ambulatory Visit | Attending: Ophthalmology | Admitting: Ophthalmology

## 2022-03-23 ENCOUNTER — Encounter (HOSPITAL_COMMUNITY)
Admission: RE | Disposition: A | Discharge status: Home / Self Care | Payer: Self-pay | Source: Ambulatory Visit | Attending: Ophthalmology

## 2022-03-23 DIAGNOSIS — K219 Gastro-esophageal reflux disease without esophagitis: Secondary | ICD-10-CM | POA: Insufficient documentation

## 2022-03-23 DIAGNOSIS — H2181 Floppy iris syndrome: Secondary | ICD-10-CM | POA: Diagnosis not present

## 2022-03-23 DIAGNOSIS — I10 Essential (primary) hypertension: Secondary | ICD-10-CM

## 2022-03-23 DIAGNOSIS — D649 Anemia, unspecified: Secondary | ICD-10-CM | POA: Diagnosis not present

## 2022-03-23 DIAGNOSIS — H25813 Combined forms of age-related cataract, bilateral: Secondary | ICD-10-CM | POA: Diagnosis not present

## 2022-03-23 DIAGNOSIS — F319 Bipolar disorder, unspecified: Secondary | ICD-10-CM | POA: Insufficient documentation

## 2022-03-23 DIAGNOSIS — H02834 Dermatochalasis of left upper eyelid: Secondary | ICD-10-CM | POA: Diagnosis not present

## 2022-03-23 DIAGNOSIS — G4730 Sleep apnea, unspecified: Secondary | ICD-10-CM | POA: Diagnosis not present

## 2022-03-23 DIAGNOSIS — F17210 Nicotine dependence, cigarettes, uncomplicated: Secondary | ICD-10-CM | POA: Diagnosis not present

## 2022-03-23 DIAGNOSIS — H5213 Myopia, bilateral: Secondary | ICD-10-CM | POA: Insufficient documentation

## 2022-03-23 DIAGNOSIS — J45909 Unspecified asthma, uncomplicated: Secondary | ICD-10-CM | POA: Insufficient documentation

## 2022-03-23 DIAGNOSIS — H25811 Combined forms of age-related cataract, right eye: Secondary | ICD-10-CM

## 2022-03-23 DIAGNOSIS — E1136 Type 2 diabetes mellitus with diabetic cataract: Secondary | ICD-10-CM | POA: Diagnosis not present

## 2022-03-23 DIAGNOSIS — H0100B Unspecified blepharitis left eye, upper and lower eyelids: Secondary | ICD-10-CM | POA: Diagnosis not present

## 2022-03-23 DIAGNOSIS — H52222 Regular astigmatism, left eye: Secondary | ICD-10-CM | POA: Insufficient documentation

## 2022-03-23 DIAGNOSIS — E1151 Type 2 diabetes mellitus with diabetic peripheral angiopathy without gangrene: Secondary | ICD-10-CM | POA: Insufficient documentation

## 2022-03-23 DIAGNOSIS — H02831 Dermatochalasis of right upper eyelid: Secondary | ICD-10-CM | POA: Insufficient documentation

## 2022-03-23 DIAGNOSIS — H0100A Unspecified blepharitis right eye, upper and lower eyelids: Secondary | ICD-10-CM | POA: Insufficient documentation

## 2022-03-23 HISTORY — PX: CATARACT EXTRACTION W/PHACO: SHX586

## 2022-03-23 LAB — GLUCOSE, CAPILLARY
Glucose-Capillary: 126 mg/dL — ABNORMAL HIGH (ref 70–99)
Glucose-Capillary: 55 mg/dL — ABNORMAL LOW (ref 70–99)

## 2022-03-23 SURGERY — PHACOEMULSIFICATION, CATARACT, WITH IOL INSERTION
Anesthesia: Monitor Anesthesia Care | Site: Eye | Laterality: Right

## 2022-03-23 MED ORDER — MOXIFLOXACIN HCL 0.5 % OP SOLN
OPHTHALMIC | Status: DC | PRN
  Administered 2022-03-23: .2 mL via OPHTHALMIC

## 2022-03-23 MED ORDER — STERILE WATER FOR IRRIGATION IR SOLN
Status: DC | PRN
  Administered 2022-03-23: 250 mL

## 2022-03-23 MED ORDER — DEXTROSE 50 % IV SOLN: 12.5000 g | INTRAVENOUS | Status: AC

## 2022-03-23 MED ORDER — LIDOCAINE HCL (PF) 1 % IJ SOLN
INTRAOCULAR | Status: DC | PRN
  Administered 2022-03-23: 1 mL via OPHTHALMIC

## 2022-03-23 MED ORDER — TETRACAINE HCL 0.5 % OP SOLN
1.0000 [drp] | OPHTHALMIC | Status: AC | PRN
  Administered 2022-03-23 (×3): 1 [drp] via OPHTHALMIC

## 2022-03-23 MED ORDER — POVIDONE-IODINE 5 % OP SOLN
OPHTHALMIC | Status: DC | PRN
  Administered 2022-03-23: 1 via OPHTHALMIC

## 2022-03-23 MED ORDER — TROPICAMIDE 1 % OP SOLN
1.0000 [drp] | OPHTHALMIC | Status: AC | PRN
  Administered 2022-03-23 (×3): 1 [drp] via OPHTHALMIC

## 2022-03-23 MED ORDER — LIDOCAINE HCL 3.5 % OP GEL
1.0000 | Freq: Once | OPHTHALMIC | Status: AC
  Administered 2022-03-23: 1 via OPHTHALMIC

## 2022-03-23 MED ORDER — MIDAZOLAM HCL 5 MG/5ML IJ SOLN
INTRAMUSCULAR | Status: DC | PRN
  Administered 2022-03-23: 1 mg via INTRAVENOUS

## 2022-03-23 MED ORDER — SODIUM HYALURONATE 23MG/ML IO SOSY
PREFILLED_SYRINGE | INTRAOCULAR | Status: DC | PRN
  Administered 2022-03-23: .6 mL via INTRAOCULAR

## 2022-03-23 MED ORDER — EPINEPHRINE PF 1 MG/ML IJ SOLN
INTRAMUSCULAR | Status: AC
  Filled 2022-03-23: qty 1

## 2022-03-23 MED ORDER — MIDAZOLAM HCL 2 MG/2ML IJ SOLN
INTRAMUSCULAR | Status: AC
  Filled 2022-03-23: qty 2

## 2022-03-23 MED ORDER — BSS IO SOLN
INTRAOCULAR | Status: DC | PRN
  Administered 2022-03-23: 15 mL via INTRAOCULAR

## 2022-03-23 MED ORDER — PHENYLEPHRINE-KETOROLAC 1-0.3 % IO SOLN
INTRAOCULAR | Status: AC
  Filled 2022-03-23: qty 4

## 2022-03-23 MED ORDER — SODIUM HYALURONATE 10 MG/ML IO SOLUTION
PREFILLED_SYRINGE | INTRAOCULAR | Status: DC | PRN
  Administered 2022-03-23: .85 mL via INTRAOCULAR

## 2022-03-23 MED ORDER — PHENYLEPHRINE-KETOROLAC 1-0.3 % IO SOLN
INTRAOCULAR | Status: DC | PRN
  Administered 2022-03-23: 500 mL via OPHTHALMIC

## 2022-03-23 MED ORDER — MOXIFLOXACIN HCL 5 MG/ML IO SOLN
INTRAOCULAR | Status: AC
  Filled 2022-03-23: qty 1

## 2022-03-23 MED ORDER — DEXTROSE 50 % IV SOLN
INTRAVENOUS | Status: AC
  Administered 2022-03-23: 12.5 g via INTRAVENOUS
  Filled 2022-03-23: qty 50

## 2022-03-23 MED ORDER — PHENYLEPHRINE HCL 2.5 % OP SOLN
1.0000 [drp] | OPHTHALMIC | Status: AC | PRN
  Administered 2022-03-23 (×3): 1 [drp] via OPHTHALMIC

## 2022-03-23 SURGICAL SUPPLY — 16 items
CATARACT SUITE SIGHTPATH (MISCELLANEOUS) ×1 IMPLANT
CLOTH BEACON ORANGE TIMEOUT ST (SAFETY) ×1 IMPLANT
EYE SHIELD UNIVERSAL CLEAR (GAUZE/BANDAGES/DRESSINGS) IMPLANT
FEE CATARACT SUITE SIGHTPATH (MISCELLANEOUS) ×1 IMPLANT
GLOVE BIOGEL PI IND STRL 6.5 (GLOVE) IMPLANT
GLOVE BIOGEL PI IND STRL 7.0 (GLOVE) ×2 IMPLANT
LENS IOL RAYNER 17.5 (Intraocular Lens) ×1 IMPLANT
LENS IOL RAYONE EMV 17.5 (Intraocular Lens) IMPLANT
NDL HYPO 18GX1.5 BLUNT FILL (NEEDLE) ×1 IMPLANT
NEEDLE HYPO 18GX1.5 BLUNT FILL (NEEDLE) ×1 IMPLANT
PAD ARMBOARD 7.5X6 YLW CONV (MISCELLANEOUS) ×1 IMPLANT
RING MALYGIN 7.0 (MISCELLANEOUS) IMPLANT
SYR TB 1ML LL NO SAFETY (SYRINGE) ×1 IMPLANT
TAPE SURG TRANSPORE 1 IN (GAUZE/BANDAGES/DRESSINGS) IMPLANT
TAPE SURGICAL TRANSPORE 1 IN (GAUZE/BANDAGES/DRESSINGS) ×1
WATER STERILE IRR 250ML POUR (IV SOLUTION) ×1 IMPLANT

## 2022-03-23 NOTE — Progress Notes (Signed)
Hypoglycemic Event  CBG: 55 mg/dl  Treatment: D50 25 mL (12.5 gm)  Symptoms: Pale, Sweaty, Shaky, and dizzy  Follow-up CBG: [id number] CBG Result:126 mg/dl  Possible Reasons for Event: Inadequate meal intake and Medication regimen:   Comments/MD notified: DR Briant Cedar notified, standing hypoglycemic orders carried out    [employer], Essie Hart

## 2022-03-23 NOTE — Discharge Instructions (Addendum)
Please discharge patient when stable, will follow up today with Dr. Wrzosek at the [facility] [place] [facility] office immediately following discharge.  Leave shield in place until visit.  All paperwork with discharge instructions will be given at the office.  [facility] Address:  [address]  [place], [place] [address]  

## 2022-03-23 NOTE — Anesthesia Preprocedure Evaluation (Signed)
Anesthesia Evaluation  Patient identified by MRN, date of birth, ID band Patient awake    Reviewed: Allergy & Precautions, H&P , NPO status , Patient's Chart, lab work & pertinent test results, reviewed documented beta blocker date and time   Airway Mallampati: II  TM Distance: >3 FB Neck ROM: full    Dental no notable dental hx.    Pulmonary neg pulmonary ROS, asthma , sleep apnea , Current Smoker   Pulmonary exam normal breath sounds clear to auscultation       Cardiovascular Exercise Tolerance: Good hypertension, + Peripheral Vascular Disease  negative cardio ROS + dysrhythmias  Rhythm:regular Rate:Normal     Neuro/Psych  PSYCHIATRIC DISORDERS   Bipolar Disorder   negative neurological ROS  negative psych ROS   GI/Hepatic negative GI ROS, Neg liver ROS,GERD  ,,  Endo/Other  negative endocrine ROSdiabetes    Renal/GU Renal diseasenegative Renal ROS  negative genitourinary   Musculoskeletal   Abdominal   Peds  Hematology negative hematology ROS (+) Blood dyscrasia, anemia   Anesthesia Other Findings   Reproductive/Obstetrics negative OB ROS                             Anesthesia Physical Anesthesia Plan  ASA: 2  Anesthesia Plan: MAC   Post-op Pain Management:    Induction:   PONV Risk Score and Plan:   Airway Management Planned:   Additional Equipment:   Intra-op Plan:   Post-operative Plan:   Informed Consent: I have reviewed the patients History and Physical, chart, labs and discussed the procedure including the risks, benefits and alternatives for the proposed anesthesia with the patient or authorized representative who has indicated his/her understanding and acceptance.     Dental Advisory Given  Plan Discussed with: CRNA  Anesthesia Plan Comments:        Anesthesia Quick Evaluation

## 2022-03-23 NOTE — Transfer of Care (Signed)
Immediate Anesthesia Transfer of Care Note  Patient: Margaret [name]  Procedure(s) Performed: CATARACT EXTRACTION PHACO AND INTRAOCULAR LENS PLACEMENT (IOC) (Right: Eye)  Patient Location: PACU  Anesthesia Type:MAC  Level of Consciousness: awake, alert , and oriented  Airway & Oxygen Therapy: Patient Spontanous Breathing  Post-op Assessment: Report given to RN and Post -op Vital signs reviewed and stable  Post vital signs: Reviewed and stable  Last Vitals:  Vitals Value Taken Time  BP    Temp    Pulse    Resp    SpO2      Last Pain:  Vitals:   03/23/22 0712  TempSrc: Oral  PainSc: 0-No pain      Patients Stated Pain Goal: 7 ([id number])  Complications: No notable events documented.

## 2022-03-23 NOTE — H&P (View-Only) (Signed)
Hypoglycemic Event  CBG: 55 mg/dl  Treatment: D50 25 mL (12.5 gm)  Symptoms: Pale, Sweaty, Shaky, and dizzy  Follow-up CBG: [id number] CBG Result:126 mg/dl  Possible Reasons for Event: Inadequate meal intake and Medication regimen:   Comments/MD notified: DR Briant Cedar notified, standing hypoglycemic orders carried out    [employer], Essie Hart

## 2022-03-23 NOTE — Interval H&P Note (Signed)
History and Physical Interval Note:  03/23/2022 7:51 AM  Margaret [name]  has presented today for surgery, with the diagnosis of combined forms age related cataract; right.  The various methods of treatment have been discussed with the patient and family. After consideration of risks, benefits and other options for treatment, the patient has consented to  Procedure(s): CATARACT EXTRACTION PHACO AND INTRAOCULAR LENS PLACEMENT ([place]) (Right) as a surgical intervention.  The patient's history has been reviewed, patient examined, no change in status, stable for surgery.  I have reviewed the patient's chart and labs.  Questions were answered to the patient's satisfaction.     Baruch Goldmann

## 2022-03-23 NOTE — Op Note (Signed)
Date of procedure: 03/23/22  Pre-operative diagnosis: Visually significant age-related combined cataract, Right Eye; Poor Dilation, Right Eye (H25.811)  Post-operative diagnosis: Visually significant age-related cataract, Right Eye; Intra-operative Floppy Iris Syndrome, Right Eye (H21.81)  Procedure: Removal of cataract via phacoemulsification and insertion of intra-ocular lens Rayner [id number] +17.5D into the capsular bag of the Right Eye (CPT [phone])  Attending surgeon: Gerda Diss. Taedyn Glasscock, MD, MA  Anesthesia: MAC, Topical Akten  Complications: None  Estimated Blood Loss: <83m (minimal)  Specimens: None  Implants: As above  Indications:  Visually significant cataract, Right Eye  Procedure:  The patient was seen and identified in the pre-operative area. The operative eye was identified and dilated.  The operative eye was marked.  Topical anesthesia was administered to the operative eye.     The patient was then to the operative suite and placed in the supine position.  A timeout was performed confirming the patient, procedure to be performed, and all other relevant information.   The patient's face was prepped and draped in the usual fashion for intra-ocular surgery.  A lid speculum was placed into the operative eye and the surgical microscope moved into place and focused.  Poor dilation of the iris was confirmed.  A superotemporal paracentesis was created using a 20 gauge paracentesis blade.  Shugarcaine was injected into the anterior chamber.  Viscoelastic was injected into the anterior chamber.  A temporal clear-corneal main wound incision was created using a 2.44mmicrokeratome.  A Malyugin ring was placed.  A continuous curvilinear capsulorrhexis was initiated using an irrigating cystitome and completed using capsulorrhexis forceps.  Hydrodissection and hydrodeliniation were performed.  Viscoelastic was injected into the anterior chamber.  A phacoemulsification handpiece and a chopper as a  second instrument were used to remove the nucleus and epinucleus. The irrigation/aspiration handpiece was used to remove any remaining cortical material.   The capsular bag was reinflated with viscoelastic, checked, and found to be intact.  The intraocular lens was inserted into the capsular bag and dialed into place using a MaSurveyor, minerals The Malyugin ring was removed.  The irrigation/aspiration handpiece was used to remove any remaining viscoelastic.  The clear corneal wound and paracentesis wounds were then hydrated and checked with Weck-Cels to be watertight. 0.54m54mf moxifloxacin was injected into the anterior chamber.  The lid-speculum and drape was removed, and the patient's face was cleaned with a wet and dry 4x4. A clear shield was taped over the eye. The patient was taken to the post-operative care unit in good condition, having tolerated the procedure well.  Post-Op Instructions: The [facility] post[facility]operative evaluation and will receive all other orders and instructions.

## 2022-03-24 NOTE — Anesthesia Postprocedure Evaluation (Signed)
Anesthesia Post Note  Patient: Margaret [name]  Procedure(s) Performed: CATARACT EXTRACTION PHACO AND INTRAOCULAR LENS PLACEMENT (IOC) (Right: Eye)  Patient location during evaluation: Phase II Anesthesia Type: MAC Level of consciousness: awake Pain management: pain level controlled Vital Signs Assessment: post-procedure vital signs reviewed and stable Respiratory status: spontaneous breathing and respiratory function stable Cardiovascular status: blood pressure returned to baseline and stable Postop Assessment: no headache and no apparent nausea or vomiting Anesthetic complications: no Comments: Late entry   No notable events documented.   Last Vitals:  Vitals:   03/23/22 0821 03/23/22 0823  BP:  (!) 89/59  Pulse: 81   Resp: 18   Temp: 36.7 C   SpO2: 100%     Last Pain:  Vitals:   03/23/22 0821  TempSrc: Oral  PainSc: 0-No pain                 Louann Sjogren

## 2022-03-27 ENCOUNTER — Encounter (HOSPITAL_COMMUNITY): Payer: Medicare PPO

## 2022-03-29 ENCOUNTER — Encounter (HOSPITAL_COMMUNITY): Payer: Self-pay | Admitting: Ophthalmology

## 2022-03-29 DIAGNOSIS — H25812 Combined forms of age-related cataract, left eye: Secondary | ICD-10-CM | POA: Diagnosis not present

## 2022-04-02 ENCOUNTER — Encounter (HOSPITAL_COMMUNITY)
Admission: RE | Admit: 2022-04-02 | Discharge: 2022-04-02 | Disposition: A | Discharge status: Home / Self Care | Payer: Medicare PPO | Source: Ambulatory Visit | Attending: Ophthalmology | Admitting: Ophthalmology

## 2022-04-02 ENCOUNTER — Encounter (HOSPITAL_COMMUNITY): Payer: Self-pay

## 2022-04-02 DIAGNOSIS — Z01818 Encounter for other preprocedural examination: Secondary | ICD-10-CM | POA: Insufficient documentation

## 2022-04-03 NOTE — H&P (Signed)
Surgical History & Physical  Patient Name: Margaret [name] DOB: [birth year]  Surgery: Cataract extraction with intraocular lens implant phacoemulsification; Left Eye  Surgeon: Baruch Goldmann MD Surgery Date:  04-06-22 Pre-Op Date:  03-29-22  HPI: A 27 Yr. old female patient 1. The patient is returning after cataract post-op. The right eye is affected. Status post cataract post-op, 1 week ago: Since the last visit, the affected area is doing well. The patient's vision is stable. Patient is following medication instructions. The patient complains of difficulty when driving due to glare from headlights or sun, which began 2 years ago. Left eye is affected. The episode is gradual. This is negatively affecting the patient's quality of life and the patient is unable to function adequately in life with the current level of vision. HPI was performed by Baruch Goldmann .  Medical History: Dry Eyes Cataracts macular drusen ADHD, bipolar, acid reflux Arthritis Diabetes Heart Problem High Blood Pressure  Review of Systems Cardiovascular High Blood Pressure, A-fib Musculoskeletal Pain Psychiatry bipolar, ADHD All recorded systems are negative except as noted above.  Social   Current every day smoker / Cigarettes   Medication Prednisolone-Moxifloxacin-Bromfenac,  Alprazolam, Wellbutrin, Diltiazem, Gabapentin, Lisinopril, Sodium bicarbonate, Sulfasalazine, Topamax, Ozempic, Toujeo, Protonix, Strattera, Amaryl, Novalog flex pen,   Sx/Procedures Phaco c IOL OD,  Back Surgery x2, Upper Arm surgery, Nodules on elbows removed, C Section,   Drug Allergies  Vicodin, Prozac, Plaquenil,   History & Physical: Heent: cataract, left eye NECK: supple without bruits LUNGS: lungs clear to auscultation CV: regular rate and rhythm Abdomen: soft and non-tender Impression & Plan: Assessment: 1.  CATARACT EXTRACTION STATUS; Right Eye (Z98.41) 2.  COMBINED FORMS AGE RELATED CATARACT; Left Eye (H25.812) 3.   INTRAOCULAR LENS IOL ; Right Eye (Z96.1) 4.  NUCLEAR SCLEROSIS AGE RELATED; Left Eye (H25.12)  Plan: 1.  1 week after cataract surgery. Doing well with improved vision and normal eye pressure. Call with any problems or concerns. Continue Pred-Moxi-Brom 2x/day for 3 more weeks.  2.  Cataract accounts for the patient's decreased vision. This visual impairment is not correctable with a tolerable change in glasses or contact lenses. Cataract surgery with an implantation of a new lens should significantly improve the visual and functional status of the patient. Discussed all risks, benefits, alternatives, and potential complications. Discussed the procedures and recovery. Patient desires to have surgery. A-scan ordered and performed today for intra-ocular lens calculations. The surgery will be performed in order to improve vision for driving, reading, and for eye examinations. Recommend phacoemulsification with intra-ocular lens. Recommend Dextenza for post-operative pain and inflammation. Left Eye. Surgery required to correct imbalance of vision. Dilates poorly - shugarcaine by protocol. Omidria. Malyugin ring.  3.  Doing well since surgery  4.

## 2022-04-06 ENCOUNTER — Ambulatory Visit (HOSPITAL_COMMUNITY)
Admission: RE | Admit: 2022-04-06 | Discharge: 2022-04-06 | Disposition: A | Discharge status: Home / Self Care | Payer: Medicare PPO | Attending: Ophthalmology | Admitting: Ophthalmology

## 2022-04-06 ENCOUNTER — Ambulatory Visit (HOSPITAL_COMMUNITY): Payer: Medicare PPO | Admitting: Anesthesiology

## 2022-04-06 ENCOUNTER — Encounter (HOSPITAL_COMMUNITY)
Admission: RE | Disposition: A | Discharge status: Home / Self Care | Payer: Self-pay | Source: Home / Self Care | Attending: Ophthalmology

## 2022-04-06 DIAGNOSIS — I10 Essential (primary) hypertension: Secondary | ICD-10-CM

## 2022-04-06 DIAGNOSIS — F17210 Nicotine dependence, cigarettes, uncomplicated: Secondary | ICD-10-CM

## 2022-04-06 DIAGNOSIS — H2181 Floppy iris syndrome: Secondary | ICD-10-CM

## 2022-04-06 DIAGNOSIS — H25812 Combined forms of age-related cataract, left eye: Secondary | ICD-10-CM | POA: Insufficient documentation

## 2022-04-06 DIAGNOSIS — F17200 Nicotine dependence, unspecified, uncomplicated: Secondary | ICD-10-CM | POA: Insufficient documentation

## 2022-04-06 DIAGNOSIS — Z7984 Long term (current) use of oral hypoglycemic drugs: Secondary | ICD-10-CM

## 2022-04-06 DIAGNOSIS — E1151 Type 2 diabetes mellitus with diabetic peripheral angiopathy without gangrene: Secondary | ICD-10-CM

## 2022-04-06 DIAGNOSIS — Z9841 Cataract extraction status, right eye: Secondary | ICD-10-CM | POA: Diagnosis not present

## 2022-04-06 DIAGNOSIS — Z961 Presence of intraocular lens: Secondary | ICD-10-CM | POA: Diagnosis not present

## 2022-04-06 DIAGNOSIS — E1136 Type 2 diabetes mellitus with diabetic cataract: Secondary | ICD-10-CM | POA: Insufficient documentation

## 2022-04-06 DIAGNOSIS — H259 Unspecified age-related cataract: Secondary | ICD-10-CM

## 2022-04-06 HISTORY — PX: CATARACT EXTRACTION W/PHACO: SHX586

## 2022-04-06 LAB — GLUCOSE, CAPILLARY
Glucose-Capillary: 81 mg/dL (ref 70–99)
Glucose-Capillary: 135 mg/dL — ABNORMAL HIGH (ref 70–99)
Glucose-Capillary: 106 mg/dL — ABNORMAL HIGH (ref 70–99)

## 2022-04-06 SURGERY — PHACOEMULSIFICATION, CATARACT, WITH IOL INSERTION
Anesthesia: Monitor Anesthesia Care | Site: Eye | Laterality: Left

## 2022-04-06 MED ORDER — PHENYLEPHRINE-KETOROLAC 1-0.3 % IO SOLN
INTRAOCULAR | Status: AC
  Filled 2022-04-06: qty 4

## 2022-04-06 MED ORDER — STERILE WATER FOR IRRIGATION IR SOLN
Status: DC | PRN
  Administered 2022-04-06: 250 mL

## 2022-04-06 MED ORDER — EPINEPHRINE PF 1 MG/ML IJ SOLN
INTRAMUSCULAR | Status: AC
  Filled 2022-04-06: qty 1

## 2022-04-06 MED ORDER — MIDAZOLAM HCL 2 MG/2ML IJ SOLN
INTRAMUSCULAR | Status: AC
  Filled 2022-04-06: qty 2

## 2022-04-06 MED ORDER — TROPICAMIDE 1 % OP SOLN
1.0000 [drp] | OPHTHALMIC | Status: AC | PRN
  Administered 2022-04-06 (×3): 1 [drp] via OPHTHALMIC

## 2022-04-06 MED ORDER — MIDAZOLAM HCL 5 MG/5ML IJ SOLN
INTRAMUSCULAR | Status: DC | PRN
  Administered 2022-04-06 (×2): 1 mg via INTRAVENOUS

## 2022-04-06 MED ORDER — TETRACAINE HCL 0.5 % OP SOLN
1.0000 [drp] | OPHTHALMIC | Status: AC | PRN
  Administered 2022-04-06 (×3): 1 [drp] via OPHTHALMIC

## 2022-04-06 MED ORDER — DEXTROSE 50 % IV SOLN
INTRAVENOUS | Status: AC
  Filled 2022-04-06: qty 50

## 2022-04-06 MED ORDER — PHENYLEPHRINE HCL 2.5 % OP SOLN
1.0000 [drp] | OPHTHALMIC | Status: AC | PRN
  Administered 2022-04-06 (×3): 1 [drp] via OPHTHALMIC

## 2022-04-06 MED ORDER — LIDOCAINE HCL (PF) 1 % IJ SOLN
INTRAOCULAR | Status: DC | PRN
  Administered 2022-04-06: 1 mL via OPHTHALMIC

## 2022-04-06 MED ORDER — DEXTROSE 50 % IV SOLN
25.0000 mL | Freq: Once | INTRAVENOUS | Status: AC
  Administered 2022-04-06: 25 mL via INTRAVENOUS

## 2022-04-06 MED ORDER — PHENYLEPHRINE-KETOROLAC 1-0.3 % IO SOLN
INTRAOCULAR | Status: DC | PRN
  Administered 2022-04-06: 500 mL via OPHTHALMIC

## 2022-04-06 MED ORDER — LIDOCAINE HCL 3.5 % OP GEL
1.0000 | Freq: Once | OPHTHALMIC | Status: AC
  Administered 2022-04-06: 1 via OPHTHALMIC

## 2022-04-06 MED ORDER — BSS IO SOLN
INTRAOCULAR | Status: DC | PRN
  Administered 2022-04-06: 15 mL via INTRAOCULAR

## 2022-04-06 MED ORDER — NEOMYCIN-POLYMYXIN-DEXAMETH 3.5-10000-0.1 OP SUSP
OPHTHALMIC | Status: DC | PRN
  Administered 2022-04-06: 2 [drp] via OPHTHALMIC

## 2022-04-06 MED ORDER — EPINEPHRINE PF 1 MG/ML IJ SOLN: INTRAOCULAR | Status: DC | PRN

## 2022-04-06 MED ORDER — SODIUM HYALURONATE 23MG/ML IO SOSY
PREFILLED_SYRINGE | INTRAOCULAR | Status: DC | PRN
  Administered 2022-04-06: .6 mL via INTRAOCULAR

## 2022-04-06 MED ORDER — SODIUM HYALURONATE 10 MG/ML IO SOLUTION
PREFILLED_SYRINGE | INTRAOCULAR | Status: DC | PRN
  Administered 2022-04-06: .85 mL via INTRAOCULAR

## 2022-04-06 MED ORDER — POVIDONE-IODINE 5 % OP SOLN
OPHTHALMIC | Status: DC | PRN
  Administered 2022-04-06: 1 via OPHTHALMIC

## 2022-04-06 SURGICAL SUPPLY — 15 items
CATARACT SUITE SIGHTPATH (MISCELLANEOUS) ×1 IMPLANT
CLOTH BEACON ORANGE TIMEOUT ST (SAFETY) ×1 IMPLANT
EYE SHIELD UNIVERSAL CLEAR (GAUZE/BANDAGES/DRESSINGS) IMPLANT
FEE CATARACT SUITE SIGHTPATH (MISCELLANEOUS) ×1 IMPLANT
GLOVE BIOGEL PI IND STRL 7.0 (GLOVE) ×2 IMPLANT
LENS IOL RAYNER 16.0 (Intraocular Lens) ×1 IMPLANT
LENS IOL RAYONE EMV 16.0 (Intraocular Lens) IMPLANT
NDL HYPO 18GX1.5 BLUNT FILL (NEEDLE) ×1 IMPLANT
NEEDLE HYPO 18GX1.5 BLUNT FILL (NEEDLE) ×1 IMPLANT
PAD ARMBOARD 7.5X6 YLW CONV (MISCELLANEOUS) ×1 IMPLANT
RING MALYGIN 7.0 (MISCELLANEOUS) IMPLANT
SYR TB 1ML LL NO SAFETY (SYRINGE) ×1 IMPLANT
TAPE SURG TRANSPORE 1 IN (GAUZE/BANDAGES/DRESSINGS) IMPLANT
TAPE SURGICAL TRANSPORE 1 IN (GAUZE/BANDAGES/DRESSINGS) ×1
WATER STERILE IRR 250ML POUR (IV SOLUTION) ×1 IMPLANT

## 2022-04-06 NOTE — Discharge Instructions (Signed)
Please discharge patient when stable, will follow up today with Dr. Lathon Adan at the [facility] [place] office immediately following discharge.  Leave shield in place until visit.  All paperwork with discharge instructions will be given at the office.  [facility] Address:  [address]  [place], [place] [address]  

## 2022-04-06 NOTE — Transfer of Care (Signed)
Immediate Anesthesia Transfer of Care Note  Patient: Margaret [name]  Procedure(s) Performed: CATARACT EXTRACTION PHACO AND INTRAOCULAR LENS PLACEMENT (IOC) (Left: Eye)  Patient Location: PACU  Anesthesia Type:MAC  Level of Consciousness: sedated  Airway & Oxygen Therapy: Patient Spontanous Breathing  Post-op Assessment: Report given to RN and Post -op Vital signs reviewed and stable  Post vital signs: Reviewed and stable  Last Vitals:  Vitals Value Taken Time  BP    Temp    Pulse    Resp    SpO2      Last Pain:  Vitals:   04/06/22 0900  PainSc: 0-No pain         Complications: No notable events documented.

## 2022-04-06 NOTE — Anesthesia Preprocedure Evaluation (Signed)
Anesthesia Evaluation  Patient identified by MRN, date of birth, ID band Patient awake    Reviewed: Allergy & Precautions, H&P , NPO status , Patient's Chart, lab work & pertinent test results  Airway Mallampati: II  TM Distance: >3 FB Neck ROM: Full    Dental  (+) Dental Advisory Given, Missing, Chipped, Poor Dentition   Pulmonary asthma , sleep apnea , Current Smoker and Patient abstained from smoking.   Pulmonary exam normal breath sounds clear to auscultation       Cardiovascular hypertension, Pt. on medications + Peripheral Vascular Disease  Normal cardiovascular exam+ dysrhythmias Atrial Fibrillation  Rhythm:Regular Rate:Normal     Neuro/Psych  PSYCHIATRIC DISORDERS   Bipolar Disorder   negative neurological ROS     GI/Hepatic Neg liver ROS,GERD  Medicated,,  Endo/Other  diabetes, Well Controlled, Type 2, Oral Hypoglycemic Agents    Renal/GU Renal disease  negative genitourinary   Musculoskeletal  (+) Arthritis , Osteoarthritis and Rheumatoid disorders,    Abdominal   Peds negative pediatric ROS (+) ADHD Hematology  (+) Blood dyscrasia, anemia   Anesthesia Other Findings   Reproductive/Obstetrics negative OB ROS                             Anesthesia Physical Anesthesia Plan  ASA: 3  Anesthesia Plan: MAC   Post-op Pain Management: Minimal or no pain anticipated   Induction:   PONV Risk Score and Plan:   Airway Management Planned: Nasal Cannula and Natural Airway  Additional Equipment:   Intra-op Plan:   Post-operative Plan:   Informed Consent: I have reviewed the patients History and Physical, chart, labs and discussed the procedure including the risks, benefits and alternatives for the proposed anesthesia with the patient or authorized representative who has indicated his/her understanding and acceptance.     Dental advisory given  Plan Discussed with: CRNA and  Surgeon  Anesthesia Plan Comments:         Anesthesia Quick Evaluation

## 2022-04-06 NOTE — Op Note (Signed)
Date of procedure: 04/06/22  Pre-operative diagnosis: Visually significant age-related combined cataract, Left Eye; Poor dilation, Left eye (H25.812)   Post-operative diagnosis: Visually significant age-related cataract, Left Eye; Intra-operative Floppy Iris Syndrome, Left Eye (H21.81)  Procedure: Complex removal of cataract via phacoemulsification and insertion of intra-ocular lens Rayner [id number] +16.0D into the capsular bag of the Left Eye (CPT [phone])  Attending surgeon: Gerda Diss. Nichele Slawson, MD, MA  Anesthesia: MAC, Topical Akten  Complications: None  Estimated Blood Loss: <63m (minimal)  Specimens: None  Implants: As above  Indications:  Visually significant cataract, Left Eye  Procedure:  The patient was seen and identified in the pre-operative area. The operative eye was identified and dilated.  The operative eye was marked.  Topical anesthesia was administered to the operative eye.     The patient was then to the operative suite and placed in the supine position.  A timeout was performed confirming the patient, procedure to be performed, and all other relevant information.   The patient's face was prepped and draped in the usual fashion for intra-ocular surgery.  A lid speculum was placed into the operative eye and the surgical microscope moved into place and focused.  Poor dilation of the iris was confirmed.  An inferotemporal paracentesis was created using a 20 gauge paracentesis blade.  Shugarcaine was injected into the anterior chamber.  Viscoelastic was injected into the anterior chamber.  A temporal clear-corneal main wound incision was created using a 2.443mmicrokeratome.  A Malyugin ring was placed.  A continuous curvilinear capsulorrhexis was initiated using an irrigating cystitome and completed using capsulorrhexis forceps.  Hydrodissection and hydrodeliniation were performed.  Viscoelastic was injected into the anterior chamber.  A phacoemulsification handpiece and a chopper as  a second instrument were used to remove the nucleus and epinucleus. The irrigation/aspiration handpiece was used to remove any remaining cortical material.   The capsular bag was reinflated with viscoelastic, checked, and found to be intact.  The intraocular lens was inserted into the capsular bag and dialed into place using a MaSurveyor, mineralsThe Malyugin ring was removed.  The irrigation/aspiration handpiece was used to remove any remaining viscoelastic.  The clear corneal wound and paracentesis wounds were then hydrated and checked with Weck-Cels to be watertight. Maxitrol drops were instilled into the operative eye. The lid-speculum and drape was removed, and the patient's face was cleaned with a wet and dry 4x4. A clear shield was taped over the eye. The patient was taken to the post-operative care unit in good condition, having tolerated the procedure well.  Post-Op Instructions: [facility] and will receive all other orders and instructions.

## 2022-04-06 NOTE — Anesthesia Postprocedure Evaluation (Signed)
Anesthesia Post Note  Patient: Margaret [name]  Procedure(s) Performed: CATARACT EXTRACTION PHACO AND INTRAOCULAR LENS PLACEMENT (IOC) (Left: Eye)  Patient location during evaluation: Phase II Anesthesia Type: MAC Level of consciousness: awake and alert and oriented Pain management: pain level controlled Vital Signs Assessment: post-procedure vital signs reviewed and stable Respiratory status: spontaneous breathing, nonlabored ventilation and respiratory function stable Cardiovascular status: stable and blood pressure returned to baseline Postop Assessment: no apparent nausea or vomiting Anesthetic complications: no  No notable events documented.   Last Vitals:  Vitals:   04/06/22 0915 04/06/22 1026  BP:  (!) 140/63  Pulse: 81 72  Resp: 18 20  Temp:  37.1 C  SpO2: 100% 99%    Last Pain:  Vitals:   04/06/22 1026  TempSrc: Oral  PainSc: 0-No pain                 Isiah Scheel C Nahdia Doucet

## 2022-04-06 NOTE — Interval H&P Note (Signed)
History and Physical Interval Note:  04/06/2022 9:57 AM  Margaret [name]  has presented today for surgery, with the diagnosis of combined forms age related cataract; left.  The various methods of treatment have been discussed with the patient and family. After consideration of risks, benefits and other options for treatment, the patient has consented to  Procedure(s): CATARACT EXTRACTION PHACO AND INTRAOCULAR LENS PLACEMENT ([place]) (Left) as a surgical intervention.  The patient's history has been reviewed, patient examined, no change in status, stable for surgery.  I have reviewed the patient's chart and labs.  Questions were answered to the patient's satisfaction.     Baruch Goldmann

## 2022-04-11 DIAGNOSIS — E1122 Type 2 diabetes mellitus with diabetic chronic kidney disease: Secondary | ICD-10-CM | POA: Diagnosis not present

## 2022-04-11 DIAGNOSIS — N183 Chronic kidney disease, stage 3 unspecified: Secondary | ICD-10-CM | POA: Diagnosis not present

## 2022-04-11 DIAGNOSIS — N2581 Secondary hyperparathyroidism of renal origin: Secondary | ICD-10-CM | POA: Diagnosis not present

## 2022-04-11 DIAGNOSIS — E872 Acidosis, unspecified: Secondary | ICD-10-CM | POA: Diagnosis not present

## 2022-04-11 DIAGNOSIS — D631 Anemia in chronic kidney disease: Secondary | ICD-10-CM | POA: Diagnosis not present

## 2022-04-11 LAB — LAB REPORT - SCANNED: EGFR: 51

## 2022-04-19 ENCOUNTER — Encounter (HOSPITAL_COMMUNITY): Payer: Self-pay | Admitting: Ophthalmology

## 2022-04-24 NOTE — Progress Notes (Unsigned)
Office Visit Note  Patient: Margaret [name]             Date of Birth: Nov 15, 1950           MRN: [id number]             PCP: Monico Blitz, MD Referring: Monico Blitz, MD Visit Date: 05/08/2022 Occupation: @GUAROCC @  Subjective:  Medication monitoring   History of Present Illness: Margaret [name] is a 72 y.o. female with history of seropositive rheumatoid arthritis.  Patient is currently taking Sulfasalazine 500 mg 2 tablets in the morning and 1 tablet in the evening. She is tolerating sulfasalazine without any side effects.  She has not missed any doses recently.  She denies any recent rheumatoid arthritis flares.  She has intermittent pain and stiffness in both hands and both knees.  She denies any joint swelling.  She has been taking tylenol arthritis daily for pain relief. She has been having close lab monitoring with her nephrologist. She denies any recurrent or recent infections.       Activities of Daily Living:  Patient reports morning stiffness for 5-10 minutes.   Patient Denies nocturnal pain.  Difficulty dressing/grooming: Denies Difficulty climbing stairs: Denies Difficulty getting out of chair: Denies Difficulty using hands for taps, buttons, cutlery, and/or writing: Denies  Review of Systems  Constitutional:  Positive for fatigue.  HENT:  Positive for mouth sores and mouth dryness.   Eyes:  Positive for dryness.  Respiratory:  Negative for shortness of breath.   Cardiovascular:  Negative for chest pain and palpitations.  Gastrointestinal:  Positive for constipation. Negative for blood in stool and diarrhea.  Endocrine: Negative for increased urination.  Genitourinary:  Negative for involuntary urination.  Musculoskeletal:  Positive for joint pain, joint pain, myalgias, morning stiffness, muscle tenderness and myalgias. Negative for gait problem, joint swelling and muscle weakness.  Skin:  Positive for sensitivity to sunlight. Negative for color change, rash and  hair loss.  Allergic/Immunologic: Negative for susceptible to infections.  Neurological:  Negative for dizziness and headaches.  Hematological:  Negative for swollen glands.  Psychiatric/Behavioral:  Positive for depressed mood and sleep disturbance. The patient is nervous/anxious.     PMFS History:  Patient Active Problem List   Diagnosis Date Noted   Lumbar stenosis with neurogenic claudication 05/09/2021   Idiopathic chronic venous hypertension of both lower extremities with inflammation 06/18/2017   Closed displaced oblique fracture of shaft of right humerus 03/27/2017   Smoker 01/09/2016   High risk medication use 01/06/2016   RA (rheumatoid arthritis) 01/05/2016   DDD (degenerative disc disease), lumbar 01/05/2016   Calcaneal spur 01/05/2016   Bipolar disorder 01/05/2016   ADD (attention deficit disorder) 01/05/2016   Sleep apnea 01/05/2016   TMJ (dislocation of temporomandibular joint) 01/05/2016   Pedal edema 01/05/2016   Osteoarthritis of both hands 01/05/2016   Osteoarthritis of both feet 01/05/2016   Osteoarthritis of both knees 01/05/2016   GERD (gastroesophageal reflux disease) 06/24/2015   Constipation 06/24/2015   Spondylolisthesis at L4-L5 level 01/24/2015   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 Pt states she was having "flutter" episodes but nothing showed on holter monitor per pt   Eczema     Gastroesophageal reflux disease    Humeral shaft fracture    right   Hyperlipidemia    Hypersomnia    IBS (irritable bowel syndrome)    Myalgia    Noncompliance with CPAP treatment    Osteoarthritis of both feet 01/05/2016   Osteoarthritis of both hands 01/05/2016   Osteoarthritis of both knees 01/05/2016   Pedal edema 01/05/2016   Peripheral vascular disease    RA (rheumatoid arthritis) 01/05/2016   +RF, +CCP, Nodulous,    Rheumatoid arthritis    Sleep apnea 2015   home sleep study    TMJ (dislocation of temporomandibular joint) 01/05/2016   Varicose veins     Family History  Problem Relation Age of Onset   Hypertension Father    Alcoholism Mother    Stroke Mother    AAA (abdominal aortic aneurysm) Brother    Gallstones Sister    Osteoporosis Sister    Alcoholism Brother    Diabetes Brother    Healthy Son    Healthy Daughter    Colon cancer Neg Hx    Colon polyps Neg Hx    Past Surgical History:  Procedure Laterality Date   BACK SURGERY     BACK SURGERY  05/09/2021   CATARACT EXTRACTION W/PHACO Right 03/23/2022   Procedure: CATARACT EXTRACTION PHACO AND INTRAOCULAR LENS PLACEMENT ([place]);  Surgeon: Baruch Goldmann, MD;  Location: [facility];  Service: Ophthalmology;  Laterality: Right;  CDE 6.44   CATARACT EXTRACTION W/PHACO Left 04/06/2022   Procedure: CATARACT EXTRACTION PHACO AND INTRAOCULAR LENS PLACEMENT ([facility]);  Surgeon: Baruch Goldmann, MD;  Location: [facility];  Service: Ophthalmology;  Laterality: Left;  CDE 6.03   CESAREAN SECTION     COLONOSCOPY  02/05/2009   Dr. Collene Mares in [place]   [place]     rheumatoid nodules removed   ENDOVENOUS ABLATION SAPHENOUS VEIN W/ LASER     ORIF HUMERUS FRACTURE Right 03/29/2017   Procedure: OPEN REDUCTION INTERNAL FIXATION (ORIF) RIGHT HUMERAL SHAFT FRACTURE;  Surgeon: Newt Minion, MD;  Location: [place];  Service: Orthopedics;  Laterality: Right;   Social History   Social History Narrative   Not on file   Immunization  History  Administered Date(s) Administered   Influenza, High Dose Seasonal PF 12/19/2017   Moderna Sars-Covid-2 Vaccination 02/26/2019, 04/03/2019   Tdap 12/19/2017   Zoster Recombinat (Shingrix) 12/19/2017, 03/03/2018     Objective: Vital Signs: BP 97/67 (BP Location: Left Arm, Patient Position: Sitting, Cuff Size: Normal)   Pulse 93   Resp 16   Ht 5' 2.5" (1.588 m)   Wt 186 lb (84.4 kg)   BMI 33.48 kg/m    Physical Exam Vitals and nursing note reviewed.  Constitutional:      Appearance: She is well-developed.  HENT:     Head: Normocephalic and atraumatic.  Eyes:     Conjunctiva/sclera: Conjunctivae normal.  Cardiovascular:     Rate and Rhythm: Normal rate and regular rhythm.     Heart sounds: Normal heart sounds.  Pulmonary:     Effort: Pulmonary effort is normal.     Breath sounds: Normal breath sounds.  Abdominal:  General: Bowel sounds are normal.     Palpations: Abdomen is soft.  Musculoskeletal:     Cervical back: Normal range of motion.  Lymphadenopathy:     Cervical: No cervical adenopathy.  Skin:    General: Skin is warm and dry.     Capillary Refill: Capillary refill takes less than 2 seconds.  Neurological:     Mental Status: She is alert and oriented to person, place, and time.  Psychiatric:        Behavior: Behavior normal.      Musculoskeletal Exam: C-spine limited ROM with lateral rotation.  Trapezius muscle tension and tenderness-left side.  Shoulder joints, elbow joints, wrist joints, Mcps, PIPs, and DIPs good ROM with no synovitis.  Complete fist formation. DIP thickening.  CMC prominence.  Hip joints have good ROM with no groin pain.  Knee joints have good Rom with no warmth or effusion.  Ankle joints have good ROM with no tenderness or joint swelling.   CDAI Exam: CDAI Score: -- Patient Global: 5 mm; Provider Global: 5 mm Swollen: --; Tender: -- Joint Exam 05/08/2022   No joint exam has been documented for this visit   There is  currently no information documented on the homunculus. Go to the Rheumatology activity and complete the homunculus joint exam.  Investigation: No additional findings.  Imaging: No results found.  Recent Labs: Lab Results  Component Value Date   WBC 7.5 12/06/2021   HGB 14.0 12/06/2021   PLT 218 12/06/2021   NA 142 12/06/2021   K 4.1 12/06/2021   CL 110 12/06/2021   CO2 22 12/06/2021   GLUCOSE 62 (L) 12/06/2021   BUN 12 12/06/2021   CREATININE 1.24 (H) 12/06/2021   BILITOT 0.3 12/06/2021   ALKPHOS 87 01/11/2020   AST 10 12/06/2021   ALT 8 12/06/2021   PROT 6.4 12/06/2021   ALBUMIN 4.3 01/11/2020   CALCIUM 9.1 12/06/2021   GFRAA 54 (L) 03/29/2020    Speciality Comments: No specialty comments available.  Procedures:  No procedures performed Allergies: Plaquenil [hydroxychloroquine], Hydrocodone, and Prozac [fluoxetine]   Assessment / Plan:     Visit Diagnoses: Rheumatoid arthritis involving multiple sites with positive rheumatoid factor: She has no joint tenderness or synovitis on examination today.  She has not had any signs or symptoms of a rheumatoid arthritis flare.  She has clinically been doing well taking sulfasalazine 500 mg 2 tablets in the morning and 1 tab in the evening.  She has been tolerating sulfasalazine without any side effects and has not missed any doses recently.  She has been taking Tylenol arthritis most days for pain relief.  Overall she continues to find sulfasalazine to be effective at managing her symptoms.  No medication changes will be made at this time.  She is advised to notify us if she develops signs or symptoms of a flare.  She will follow-up in the office in 5 months or sooner if needed.  High risk medication use - Sulfasalazine 500 mg 2 tablets in the morning and 1 tablet in the evening. CBC and renal panel updated on 04/11/22. Her next lab work will be due in June and every 3 months  No recent or recurrent infections.  Discussed the importance  of holding sulfsalazine if she develops signs or symptoms of an infection and to resume once the infection has completely cleared.   Primary osteoarthritis of both hands: She has PIP and DIP thickening consistent with osteoarthritis of both hands.  Complete fist  formation bilaterally.  She experiences intermittent aching and stiffness in both hands.  Discussed the importance of joint protection and muscle strengthening.  No synovitis was noted.  Primary osteoarthritis of both knees - evaluated by Dr. French Ana and proceeded with a MRI of the right knee on 12/20/2019-overall unremarkable.  She experiences intermittent discomfort and stiffness in both knee joints.  No mechanical symptoms.  No recent falls.  She has been taking Tylenol arthritis on a daily basis for pain relief.  She has good range of motion of both knee joints on examination today.  No warmth or effusion noted.  Primary osteoarthritis of both feet: She is not experiencing any increased discomfort in her feet currently.  She has good range of motion of both ankle joints with no tenderness or synovitis.  She is wearing proper fitting shoes.  Trochanteric bursitis of both hips: She has tenderness palpation over bilateral trochanteric bursa, left greater than right.  Discussed the importance of performing stretching exercises daily.  DDD (degenerative disc disease), lumbar - L3-L4 decompression and extension of fusion in April, 2023 by Dr. Kathyrn Sheriff.  Status post L4-L5 PLIF 2016 by Dr. Kathyrn Sheriff. released by Dr. Kathyrn Sheriff.  She continues to experience intermittent discomfort in her lower back.  She takes Tylenol arthritis on a daily basis for symptomatic relief.  Spondylolisthesis at L4-L5 level  Other medical conditions are listed as follows:  Osteopenia of multiple sites: DEXA followed by PCP.  History of humerus fracture  Stage 3a chronic kidney disease: Under the care of nephrology.  Patient had recent lab work on 04/11/2022:  Creatinine 1.16 and GFR 51.  Essential hypertension: Blood pressure was 97/67 today in the office.  History of IBS  History of asthma  Nail dystrophy  History of diabetes mellitus  Attention deficit disorder, unspecified hyperactivity presence  History of bipolar disorder  History of gastroesophageal reflux (GERD)  Smoker  Orders: No orders of the defined types were placed in this encounter.  No orders of the defined types were placed in this encounter.    Follow-Up Instructions: Return in about 5 months (around 10/08/2022) for Rheumatoid arthritis.   Ofilia Neas, PA-C  Note - This record has been created using Dragon software.  Chart creation errors have been sought, but may not always  have been located. Such creation errors do not reflect on  the standard of medical care.

## 2022-05-08 ENCOUNTER — Ambulatory Visit: Payer: Medicare PPO | Attending: Physician Assistant | Admitting: Physician Assistant

## 2022-05-08 ENCOUNTER — Encounter: Payer: Self-pay | Admitting: Physician Assistant

## 2022-05-08 VITALS — BP 97/67 | HR 93 | Resp 16 | Ht 62.5 in | Wt 186.0 lb

## 2022-05-08 DIAGNOSIS — M8589 Other specified disorders of bone density and structure, multiple sites: Secondary | ICD-10-CM

## 2022-05-08 DIAGNOSIS — M7062 Trochanteric bursitis, left hip: Secondary | ICD-10-CM

## 2022-05-08 DIAGNOSIS — F17200 Nicotine dependence, unspecified, uncomplicated: Secondary | ICD-10-CM

## 2022-05-08 DIAGNOSIS — F988 Other specified behavioral and emotional disorders with onset usually occurring in childhood and adolescence: Secondary | ICD-10-CM

## 2022-05-08 DIAGNOSIS — Z79899 Other long term (current) drug therapy: Secondary | ICD-10-CM | POA: Diagnosis not present

## 2022-05-08 DIAGNOSIS — M19041 Primary osteoarthritis, right hand: Secondary | ICD-10-CM

## 2022-05-08 DIAGNOSIS — M170 Bilateral primary osteoarthritis of knee: Secondary | ICD-10-CM | POA: Diagnosis not present

## 2022-05-08 DIAGNOSIS — M19071 Primary osteoarthritis, right ankle and foot: Secondary | ICD-10-CM

## 2022-05-08 DIAGNOSIS — M7061 Trochanteric bursitis, right hip: Secondary | ICD-10-CM

## 2022-05-08 DIAGNOSIS — Z8659 Personal history of other mental and behavioral disorders: Secondary | ICD-10-CM

## 2022-05-08 DIAGNOSIS — M19072 Primary osteoarthritis, left ankle and foot: Secondary | ICD-10-CM

## 2022-05-08 DIAGNOSIS — M4316 Spondylolisthesis, lumbar region: Secondary | ICD-10-CM | POA: Diagnosis not present

## 2022-05-08 DIAGNOSIS — M0579 Rheumatoid arthritis with rheumatoid factor of multiple sites without organ or systems involvement: Secondary | ICD-10-CM | POA: Diagnosis not present

## 2022-05-08 DIAGNOSIS — Z8781 Personal history of (healed) traumatic fracture: Secondary | ICD-10-CM

## 2022-05-08 DIAGNOSIS — L603 Nail dystrophy: Secondary | ICD-10-CM

## 2022-05-08 DIAGNOSIS — M5136 Other intervertebral disc degeneration, lumbar region: Secondary | ICD-10-CM | POA: Diagnosis not present

## 2022-05-08 DIAGNOSIS — I10 Essential (primary) hypertension: Secondary | ICD-10-CM

## 2022-05-08 DIAGNOSIS — Z8719 Personal history of other diseases of the digestive system: Secondary | ICD-10-CM

## 2022-05-08 DIAGNOSIS — N1831 Chronic kidney disease, stage 3a: Secondary | ICD-10-CM

## 2022-05-08 DIAGNOSIS — Z8709 Personal history of other diseases of the respiratory system: Secondary | ICD-10-CM

## 2022-05-08 DIAGNOSIS — M19042 Primary osteoarthritis, left hand: Secondary | ICD-10-CM

## 2022-05-08 DIAGNOSIS — Z8639 Personal history of other endocrine, nutritional and metabolic disease: Secondary | ICD-10-CM

## 2022-05-08 NOTE — Patient Instructions (Addendum)
Standing Labs We placed an order today for your standing lab work.   Please have your standing labs drawn in June and every 3 months   Please have your labs drawn 2 weeks prior to your appointment so that the provider can discuss your lab results at your appointment, if possible.  Please note that you may see your imaging and lab results in MyChart before we have reviewed them. We will contact you once all results are reviewed. Please allow our office up to 72 hours to thoroughly review all of the results before contacting the office for clarification of your results.  WALK-IN LAB HOURS  Monday through Thursday from 8:00 am -12:30 pm and 1:00 pm-5:00 pm and Friday from 8:00 am-12:00 pm.  Patients with office visits requiring labs will be seen before walk-in labs.  You may encounter longer than normal wait times. Please allow additional time. Wait times may be shorter on  Monday and Thursday afternoons.  We do not book appointments for walk-in labs. We appreciate your patience and understanding with our staff.   Labs are drawn by [facility]. Please bring your co-pay at the time of your lab draw.  You may receive a bill from [facility] for your lab work.  Please note if you are on Hydroxychloroquine and and an order has been placed for a Hydroxychloroquine level,  you will need to have it drawn 4 hours or more after your last dose.  If you wish to have your labs drawn at another location, please call the office 24 hours in advance so we can fax the orders.  The office is located at [address], [address], [place], [place] [address]   If you have any questions regarding directions or hours of operation,  please call [phone].   As a reminder, please drink plenty of water prior to coming for your lab work. Thanks!   Shoulder Exercises Ask your health care provider which exercises are safe for you. Do exercises exactly as told by your health care provider and adjust them as directed. It is  normal to feel mild stretching, pulling, tightness, or discomfort as you do these exercises. Stop right away if you feel sudden pain or your pain gets worse. Do not begin these exercises until told by your health care provider. Stretching exercises External rotation and abduction This exercise is sometimes called corner stretch. The exercise rotates your arm outward (external rotation) and moves your arm out from your body (abduction). Stand in a doorway with one of your feet slightly in front of the other. This is called a staggered stance. If you cannot reach your forearms to the door frame, stand facing a corner of a room. Choose one of the following positions as told by your health care provider: Place your hands and forearms on the door frame above your head. Place your hands and forearms on the door frame at the height of your head. Place your hands on the door frame at the height of your elbows. Slowly move your weight onto your front foot until you feel a stretch across your chest and in the front of your shoulders. Keep your head and chest upright and keep your abdominal muscles tight. Hold for __________ seconds. To release the stretch, shift your weight to your back foot. Repeat __________ times. Complete this exercise __________ times a day. Extension, standing  Stand and hold a broomstick, a cane, or a similar object behind your back. Your hands should be a little wider   than shoulder-width apart. Your palms should face away from your back. Keeping your elbows straight and your shoulder muscles relaxed, move the stick away from your body until you feel a stretch in your shoulders (extension). Avoid shrugging your shoulders while you move the stick. Keep your shoulder blades tucked down toward the middle of your back. Hold for __________ seconds. Slowly return to the starting position. Repeat __________ times. Complete this exercise __________ times a day. Range-of-motion  exercises Pendulum  Stand near a wall or a surface that you can hold onto for balance. Bend at the waist and let your left / right arm hang straight down. Use your other arm to support you. Keep your back straight and do not lock your knees. Relax your left / right arm and shoulder muscles, and move your hips and your trunk so your left / right arm swings freely. Your arm should swing because of the motion of your body, not because you are using your arm or shoulder muscles. Keep moving your hips and trunk so your arm swings in the following directions, as told by your health care provider: Side to side. Forward and backward. In clockwise and counterclockwise circles. Continue each motion for __________ seconds, or for as long as told by your health care provider. Slowly return to the starting position. Repeat __________ times. Complete this exercise __________ times a day. Shoulder flexion, standing  Stand and hold a broomstick, a cane, or a similar object. Place your hands a little more than shoulder-width apart on the object. Your left / right hand should be palm-up, and your other hand should be palm-down. Keep your elbow straight and your shoulder muscles relaxed. Push the stick up with your healthy arm to raise your left / right arm in front of your body, and then over your head until you feel a stretch in your shoulder (flexion). Avoid shrugging your shoulder while you raise your arm. Keep your shoulder blade tucked down toward the middle of your back. Hold for __________ seconds. Slowly return to the starting position. Repeat __________ times. Complete this exercise __________ times a day. Shoulder abduction, standing  Stand and hold a broomstick, a cane, or a similar object. Place your hands a little more than shoulder-width apart on the object. Your left / right hand should be palm-up, and your other hand should be palm-down. Keep your elbow straight and your shoulder muscles  relaxed. Push the object across your body toward your left / right side. Raise your left / right arm to the side of your body (abduction) until you feel a stretch in your shoulder. Do not raise your arm above shoulder height unless your health care provider tells you to do that. If directed, raise your arm over your head. Avoid shrugging your shoulder while you raise your arm. Keep your shoulder blade tucked down toward the middle of your back. Hold for __________ seconds. Slowly return to the starting position. Repeat __________ times. Complete this exercise __________ times a day. Internal rotation  Place your left / right hand behind your back, palm-up. Use your other hand to dangle an exercise band, a broomstick, or a similar object over your shoulder. Grasp the band with your left / right hand so you are holding on to both ends. Gently pull up on the band until you feel a stretch in the front of your left / right shoulder. The movement of your arm toward the center of your body is called internal rotation. Avoid shrugging   your shoulder while you raise your arm. Keep your shoulder blade tucked down toward the middle of your back. Hold for __________ seconds. Release the stretch by letting go of the band and lowering your hands. Repeat __________ times. Complete this exercise __________ times a day. Strengthening exercises External rotation  Sit in a stable chair without armrests. Secure an exercise band to a stable object at elbow height on your left / right side. Place a soft object, such as a folded towel or a small pillow, between your left / right upper arm and your body to move your elbow about 4 inches (10 cm) away from your side. Hold the end of the exercise band so it is tight and there is no slack. Keeping your elbow pressed against the soft object, slowly move your forearm out, away from your abdomen (external rotation). Keep your body steady so only your forearm moves. Hold for  __________ seconds. Slowly return to the starting position. Repeat __________ times. Complete this exercise __________ times a day. Shoulder abduction  Sit in a stable chair without armrests, or stand up. Hold a __________ lb / kg weight in your left / right hand, or hold an exercise band with both hands. Start with your arms straight down and your left / right palm facing in, toward your body. Slowly lift your left / right hand out to your side (abduction). Do not lift your hand above shoulder height unless your health care provider tells you that this is safe. Keep your arms straight. Avoid shrugging your shoulder while you do this movement. Keep your shoulder blade tucked down toward the middle of your back. Hold for __________ seconds. Slowly lower your arm, and return to the starting position. Repeat __________ times. Complete this exercise __________ times a day. Shoulder extension  Sit in a stable chair without armrests, or stand up. Secure an exercise band to a stable object in front of you so it is at shoulder height. Hold one end of the exercise band in each hand. Straighten your elbows and lift your hands up to shoulder height. Squeeze your shoulder blades together as you pull your hands down to the sides of your thighs (extension). Stop when your hands are straight down by your sides. Do not let your hands go behind your body. Hold for __________ seconds. Slowly return to the starting position. Repeat __________ times. Complete this exercise __________ times a day. Shoulder row  Sit in a stable chair without armrests, or stand up. Secure an exercise band to a stable object in front of you so it is at chest height. Hold one end of the exercise band in each hand. Position your palms so that your thumbs are facing the ceiling (neutral position). Bend each of your elbows to a 90-degree angle (right angle) and keep your upper arms at your sides. Step back or move the chair back  until the band is tight and there is no slack. Slowly pull your elbows back behind you. Hold for __________ seconds. Slowly return to the starting position. Repeat __________ times. Complete this exercise __________ times a day. Shoulder press-ups  Sit in a stable chair that has armrests. Sit upright, with your feet flat on the floor. Put your hands on the armrests so your elbows are bent and your fingers are pointing forward. Your hands should be about even with the sides of your body. Push down on the armrests and use your arms to lift yourself off the chair. Straighten your   elbows and lift yourself up as much as you comfortably can. Move your shoulder blades down, and avoid letting your shoulders move up toward your ears. Keep your feet on the ground. As you get stronger, your feet should support less of your body weight as you lift yourself up. Hold for __________ seconds. Slowly lower yourself back into the chair. Repeat __________ times. Complete this exercise __________ times a day. Wall push-ups  Stand so you are facing a stable wall. Your feet should be about one arm-length away from the wall. Lean forward and place your palms on the wall at shoulder height. Keep your feet flat on the floor as you bend your elbows and lean forward toward the wall. Hold for __________ seconds. Straighten your elbows to push yourself back to the starting position. Repeat __________ times. Complete this exercise __________ times a day. This information is not intended to replace advice given to you by your health care provider. Make sure you discuss any questions you have with your health care provider. Document Revised: 03/14/2021 Document Reviewed: 03/14/2021 [employer] Patient Education  2023 [employer]   

## 2022-05-23 DIAGNOSIS — F3112 Bipolar disorder, current episode manic without psychotic features, moderate: Secondary | ICD-10-CM | POA: Diagnosis not present

## 2022-05-31 DIAGNOSIS — M069 Rheumatoid arthritis, unspecified: Secondary | ICD-10-CM | POA: Diagnosis not present

## 2022-06-01 DIAGNOSIS — E1122 Type 2 diabetes mellitus with diabetic chronic kidney disease: Secondary | ICD-10-CM | POA: Diagnosis not present

## 2022-06-01 DIAGNOSIS — K5900 Constipation, unspecified: Secondary | ICD-10-CM | POA: Diagnosis not present

## 2022-06-01 DIAGNOSIS — Z299 Encounter for prophylactic measures, unspecified: Secondary | ICD-10-CM | POA: Diagnosis not present

## 2022-06-01 DIAGNOSIS — I10 Essential (primary) hypertension: Secondary | ICD-10-CM | POA: Diagnosis not present

## 2022-06-01 DIAGNOSIS — N183 Chronic kidney disease, stage 3 unspecified: Secondary | ICD-10-CM | POA: Diagnosis not present

## 2022-06-01 DIAGNOSIS — E1165 Type 2 diabetes mellitus with hyperglycemia: Secondary | ICD-10-CM | POA: Diagnosis not present

## 2022-07-04 ENCOUNTER — Other Ambulatory Visit: Payer: Self-pay | Admitting: Physician Assistant

## 2022-07-04 NOTE — Telephone Encounter (Signed)
Last Fill: 01/25/2022  Labs: 04/11/2022 CBC MCV 99, MCH 33.2, Creatinine 1.16, eGFR 5, Chloride 112 I called patient labs are due. CMP 12/06/2021 CBC WNL. Creatinine elevated-1.24 and GFR low-47. Please advise patient to avoid the use of NSAIDs.  Rest of CMP WNL   Next Visit: 10/17/2022  Last Visit: 05/08/2022  DX: Rheumatoid arthritis involving multiple sites with positive rheumatoid factor:   Current Dose per office note 05/08/2022: Sulfasalazine 500 mg 2 tablets in the morning and 1 tablet in the evening.   Okay to refill Sulfasalazine?

## 2022-07-18 ENCOUNTER — Other Ambulatory Visit: Payer: Self-pay

## 2022-07-18 DIAGNOSIS — Z79899 Other long term (current) drug therapy: Secondary | ICD-10-CM

## 2022-07-18 NOTE — Progress Notes (Unsigned)
Patient contacted the office and states she needs her labs released to [facility]. Labs have been released to [facility].

## 2022-07-20 DIAGNOSIS — Z79899 Other long term (current) drug therapy: Secondary | ICD-10-CM | POA: Diagnosis not present

## 2022-07-21 LAB — CBC WITH DIFFERENTIAL/PLATELET
Absolute Monocytes: 508 cells/uL (ref 200–950)
Basophils Absolute: 49 cells/uL (ref 0–200)
Basophils Relative: 0.6 %
Eosinophils Absolute: 139 cells/uL (ref 15–500)
Eosinophils Relative: 1.7 %
HCT: 44.1 % (ref 35.0–45.0)
Hemoglobin: 14.9 g/dL (ref 11.7–15.5)
Lymphs Abs: 2403 cells/uL (ref 850–3900)
MCH: 33.7 pg — ABNORMAL HIGH (ref 27.0–33.0)
MCHC: 33.8 g/dL (ref 32.0–36.0)
MCV: 99.8 fL (ref 80.0–100.0)
MPV: 9.1 fL (ref 7.5–12.5)
Monocytes Relative: 6.2 %
Neutro Abs: 5100 cells/uL (ref 1500–7800)
Neutrophils Relative %: 62.2 %
Platelets: 167 10*3/uL (ref 140–400)
RBC: 4.42 10*6/uL (ref 3.80–5.10)
RDW: 12.9 % (ref 11.0–15.0)
Total Lymphocyte: 29.3 %
WBC: 8.2 10*3/uL (ref 3.8–10.8)

## 2022-07-21 LAB — COMPLETE METABOLIC PANEL WITH GFR
AG Ratio: 2 (calc) (ref 1.0–2.5)
ALT: 7 U/L (ref 6–29)
AST: 11 U/L (ref 10–35)
Albumin: 4.3 g/dL (ref 3.6–5.1)
Alkaline phosphatase (APISO): 65 U/L (ref 37–153)
BUN/Creatinine Ratio: 16 (calc) (ref 6–22)
BUN: 21 mg/dL (ref 7–25)
CO2: 19 mmol/L — ABNORMAL LOW (ref 20–32)
Calcium: 8.7 mg/dL (ref 8.6–10.4)
Chloride: 110 mmol/L (ref 98–110)
Creat: 1.28 mg/dL — ABNORMAL HIGH (ref 0.50–1.05)
Globulin: 2.1 g/dL (calc) (ref 1.9–3.7)
Glucose, Bld: 119 mg/dL (ref 65–139)
Potassium: 4.1 mmol/L (ref 3.5–5.3)
Sodium: 140 mmol/L (ref 135–146)
Total Bilirubin: 0.4 mg/dL (ref 0.2–1.2)
Total Protein: 6.4 g/dL (ref 6.1–8.1)
eGFR: 45 mL/min/{1.73_m2} — ABNORMAL LOW (ref >=60)

## 2022-07-23 NOTE — Progress Notes (Signed)
CBC WNL  Creatinine remains elevated and GFR is low-45. Avoid the use of NSAIDs. Rest of CMP WNL

## 2022-08-22 DIAGNOSIS — Z1339 Encounter for screening examination for other mental health and behavioral disorders: Secondary | ICD-10-CM | POA: Diagnosis not present

## 2022-08-22 DIAGNOSIS — F17210 Nicotine dependence, cigarettes, uncomplicated: Secondary | ICD-10-CM | POA: Diagnosis not present

## 2022-08-22 DIAGNOSIS — Z0000 Encounter for general adult medical examination without abnormal findings: Secondary | ICD-10-CM | POA: Diagnosis not present

## 2022-08-22 DIAGNOSIS — Z299 Encounter for prophylactic measures, unspecified: Secondary | ICD-10-CM | POA: Diagnosis not present

## 2022-08-22 DIAGNOSIS — Z1331 Encounter for screening for depression: Secondary | ICD-10-CM | POA: Diagnosis not present

## 2022-08-22 DIAGNOSIS — Z7189 Other specified counseling: Secondary | ICD-10-CM | POA: Diagnosis not present

## 2022-08-22 DIAGNOSIS — Z1211 Encounter for screening for malignant neoplasm of colon: Secondary | ICD-10-CM | POA: Diagnosis not present

## 2022-08-22 DIAGNOSIS — I10 Essential (primary) hypertension: Secondary | ICD-10-CM | POA: Diagnosis not present

## 2022-08-23 LAB — LAB REPORT - SCANNED: EGFR: 55

## 2022-08-31 DIAGNOSIS — Z299 Encounter for prophylactic measures, unspecified: Secondary | ICD-10-CM | POA: Diagnosis not present

## 2022-08-31 DIAGNOSIS — S42032A Displaced fracture of lateral end of left clavicle, initial encounter for closed fracture: Secondary | ICD-10-CM | POA: Diagnosis not present

## 2022-08-31 DIAGNOSIS — E1122 Type 2 diabetes mellitus with diabetic chronic kidney disease: Secondary | ICD-10-CM | POA: Diagnosis not present

## 2022-08-31 DIAGNOSIS — M25512 Pain in left shoulder: Secondary | ICD-10-CM | POA: Diagnosis not present

## 2022-08-31 DIAGNOSIS — N183 Chronic kidney disease, stage 3 unspecified: Secondary | ICD-10-CM | POA: Diagnosis not present

## 2022-08-31 DIAGNOSIS — Z0000 Encounter for general adult medical examination without abnormal findings: Secondary | ICD-10-CM | POA: Diagnosis not present

## 2022-08-31 DIAGNOSIS — E1165 Type 2 diabetes mellitus with hyperglycemia: Secondary | ICD-10-CM | POA: Diagnosis not present

## 2022-08-31 DIAGNOSIS — M70812 Other soft tissue disorders related to use, overuse and pressure, left shoulder: Secondary | ICD-10-CM | POA: Diagnosis not present

## 2022-08-31 DIAGNOSIS — I10 Essential (primary) hypertension: Secondary | ICD-10-CM | POA: Diagnosis not present

## 2022-08-31 DIAGNOSIS — S42002A Fracture of unspecified part of left clavicle, initial encounter for closed fracture: Secondary | ICD-10-CM | POA: Diagnosis not present

## 2022-09-07 DIAGNOSIS — M25512 Pain in left shoulder: Secondary | ICD-10-CM | POA: Diagnosis not present

## 2022-09-07 DIAGNOSIS — S42002D Fracture of unspecified part of left clavicle, subsequent encounter for fracture with routine healing: Secondary | ICD-10-CM | POA: Diagnosis not present

## 2022-09-13 IMAGING — MG DIGITAL SCREENING BILAT W/ TOMO W/ CAD
6 of 12 series · 6 of 36 positions shown · non-contrast
Comparison: Previous exam(s).

CLINICAL DATA: Screening.

EXAM:
DIGITAL SCREENING BILATERAL MAMMOGRAM WITH TOMO AND CAD

[R MLO synth-2D (1 of 2)]
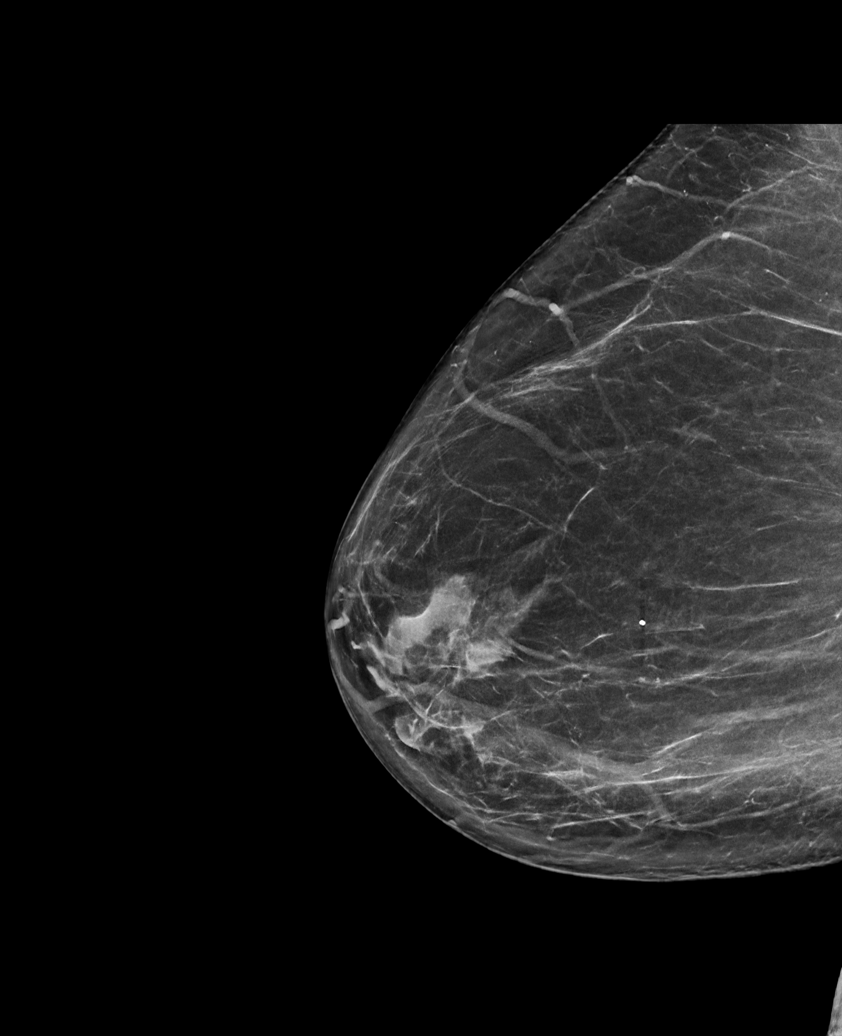

[R MLO synth-2D (2 of 2)]
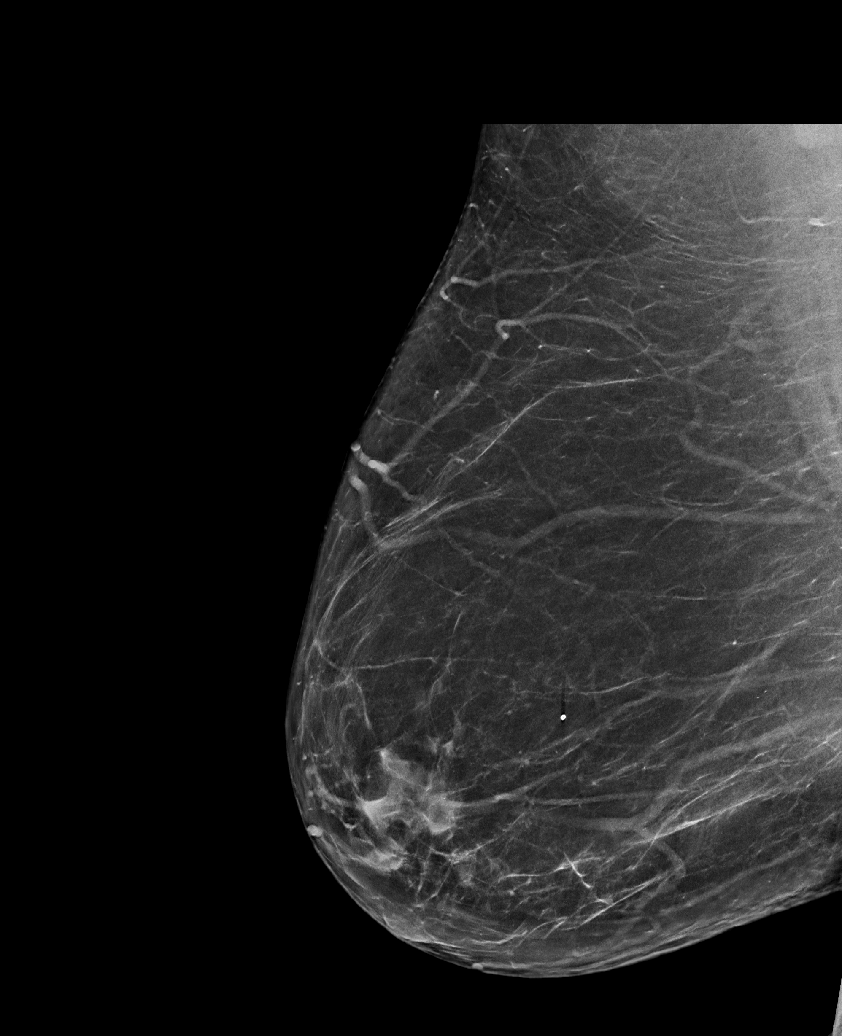

[L MLO synth-2D (1 of 2)]
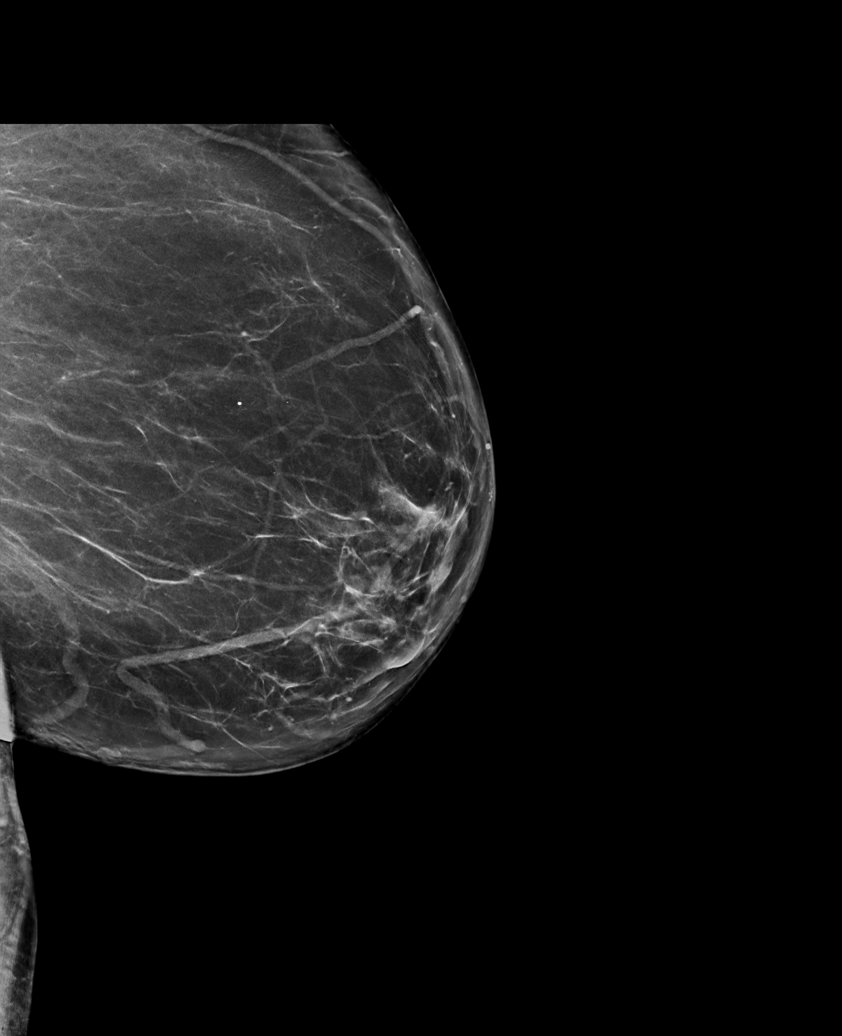

[L CC synth-2D]
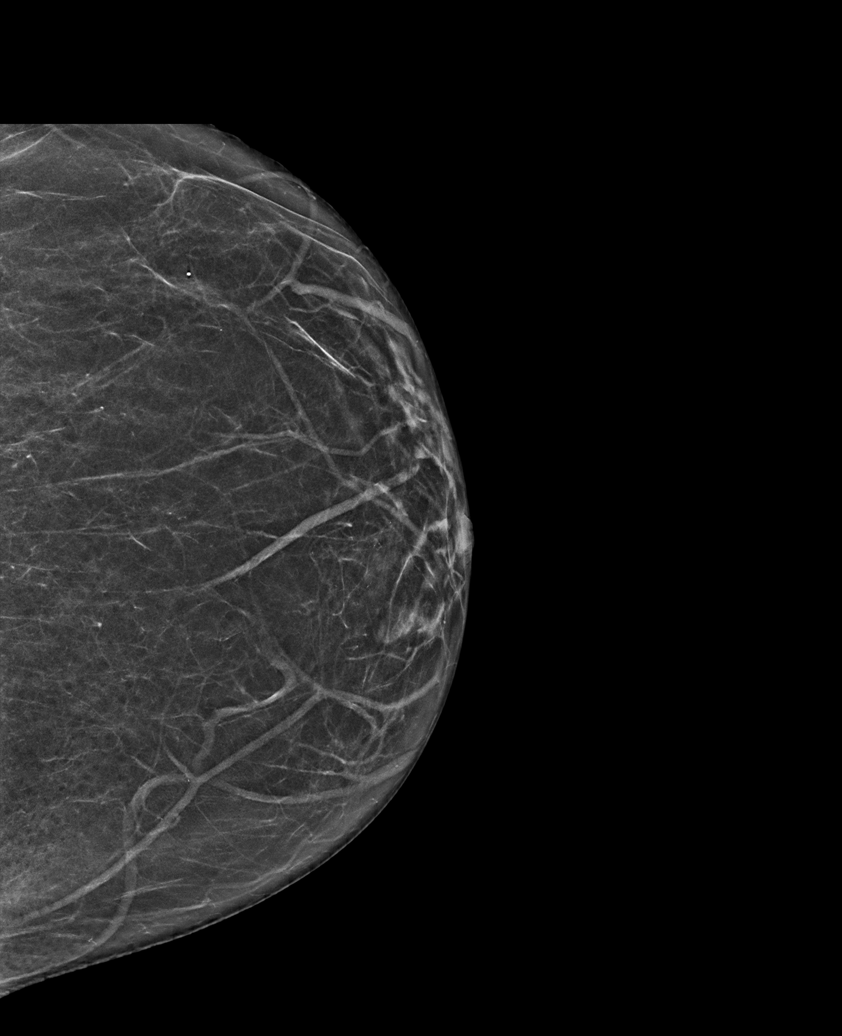

[L MLO synth-2D (2 of 2)]
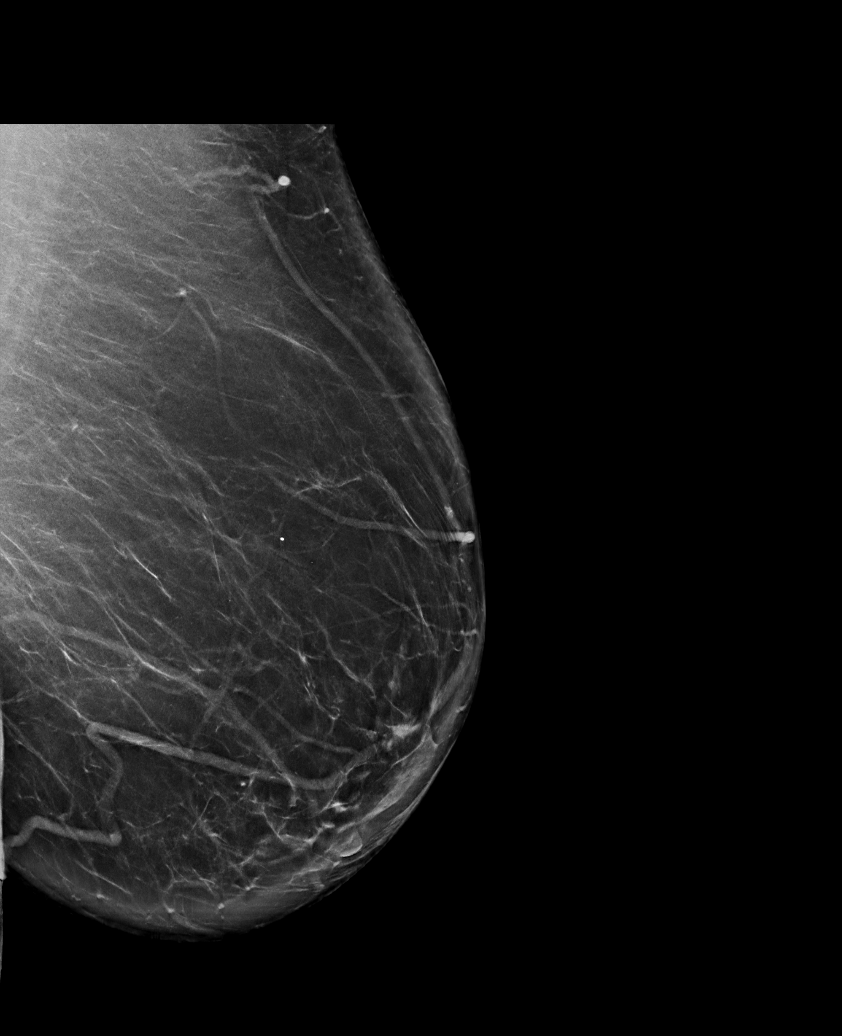

[R CC synth-2D]
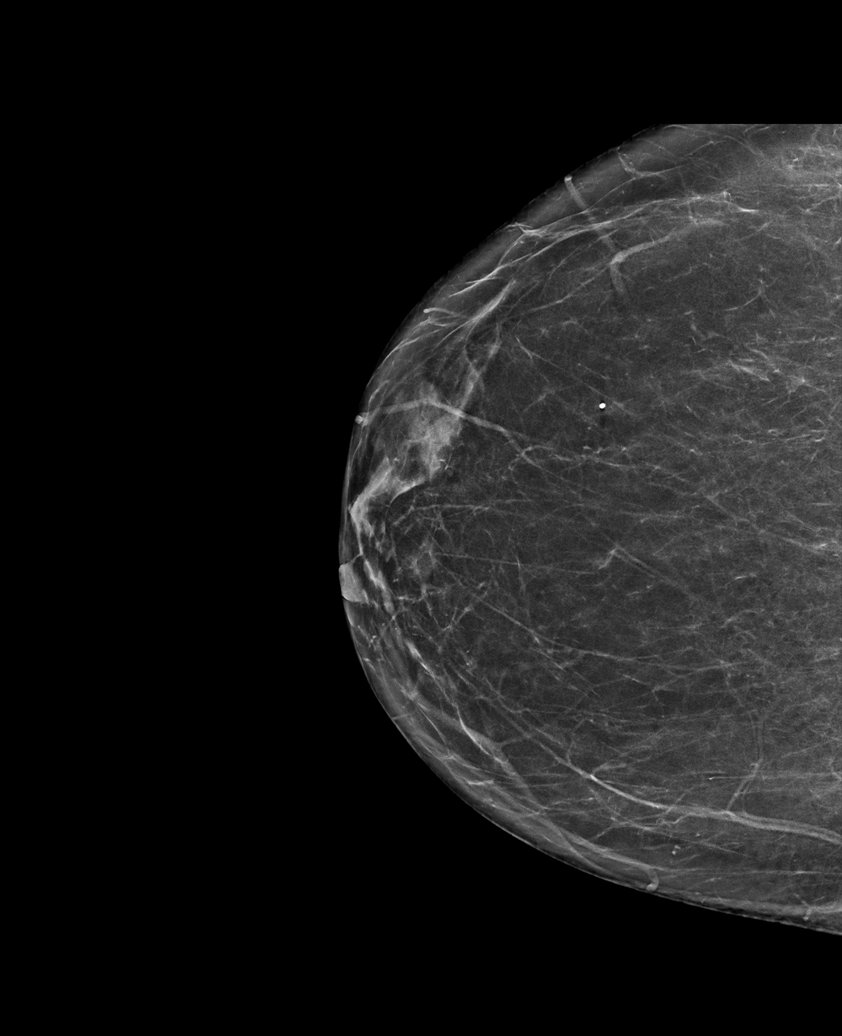

[6 of 36 positions shown; findings below may reference images not displayed]

ACR Breast Density Category b: There are scattered areas of
fibroglandular density.
FINDINGS: There are no findings suspicious for malignancy. Images were
processed with CAD.
IMPRESSION: No mammographic evidence of malignancy. A result letter of this
screening mammogram will be mailed directly to the patient.

RECOMMENDATION:
Screening mammogram in one year. (Code:[id number])

BI-RADS CATEGORY  1: Negative.

## 2022-09-19 ENCOUNTER — Other Ambulatory Visit: Payer: Self-pay | Admitting: Physician Assistant

## 2022-09-19 ENCOUNTER — Telehealth: Payer: Self-pay | Admitting: *Deleted

## 2022-09-19 NOTE — Telephone Encounter (Signed)
Labs received from:Dr. Kirstie Peri  Drawn on: 08/22/2022  Reviewed by: Sherron Ales, PA-C  Labs drawn: Renal Function Panel, PTH, Intact,Lipid Panel, TSH, CBC, Ferritin  Results: Creat. 1.09   GFR 55   Chloride 110   CO2 17   MCV 101   MCH 33.3  Patient is on SSZ 2 tabs in the morning and 1 tab in the evening.

## 2022-09-19 NOTE — Telephone Encounter (Signed)
Last Fill: 07/05/2022  Labs: 08/22/2022 Creat. 1.09   GFR 55   Chloride 110   CO2 17   MCV 101   MCH 33.3  Next Visit: 10/30/2022  Last Visit: 05/08/2022  DX: Rheumatoid arthritis involving multiple sites with positive rheumatoid factor   Current Dose per office note 05/08/2022: Sulfasalazine 500 mg 2 tablets in the morning and 1 tablet in the evening.   Okay to refill Sulfasalazine?

## 2022-10-12 DIAGNOSIS — M25512 Pain in left shoulder: Secondary | ICD-10-CM | POA: Diagnosis not present

## 2022-10-16 NOTE — Progress Notes (Unsigned)
Office Visit Note  Patient: Margaret [name]             Date of Birth: October 29, 1950           MRN: [id number]             PCP: Kirstie Peri, MD Referring: Kirstie Peri, MD Visit Date: 10/30/2022 Occupation: @GUAROCC @  Subjective:  Left clavicle pain   History of Present Illness: Margaret [name] is a 72 y.o. female with history of seropositive rheumatoid arthritis and osteoarthritis.  Patient remains on Sulfasalazine 500 mg 2 tablets in the morning and 1 tablet in the evening. She is tolerating sulfasalazine without any side effects.  She has not missed any doses recently.  She denies any signs or symptoms of a flare.   Patient reports that about 8 weeks ago she tripped on a rug at home and fell into a wall.  She states that she fractured her left clavicle.  She is scheduled for repeat CT to assess for healing.  She is can continuing to wear a sling due to ongoing discomfort. She denies any recent or recurrent infections.   Activities of Daily Living:  Patient reports morning stiffness for 20 minutes.   Patient Reports nocturnal pain.  Difficulty dressing/grooming: Denies Difficulty climbing stairs: Denies Difficulty getting out of chair: Denies Difficulty using hands for taps, buttons, cutlery, and/or writing: Denies  Review of Systems  Constitutional:  Positive for fatigue.  HENT:  Positive for mouth dryness. Negative for mouth sores.   Eyes:  Positive for dryness.  Respiratory:  Negative for shortness of breath.   Cardiovascular:  Negative for chest pain and palpitations.  Gastrointestinal:  Positive for constipation. Negative for blood in stool and diarrhea.  Endocrine: Negative for increased urination.  Genitourinary:  Negative for involuntary urination.  Musculoskeletal:  Positive for joint pain, joint pain, myalgias, morning stiffness, muscle tenderness and myalgias. Negative for gait problem, joint swelling and muscle weakness.  Skin:  Negative for color change, rash, hair  loss and sensitivity to sunlight.  Allergic/Immunologic: Negative for susceptible to infections.  Neurological:  Negative for dizziness and headaches.  Hematological:  Negative for swollen glands.  Psychiatric/Behavioral:  Negative for depressed mood and sleep disturbance. The patient is not nervous/anxious.     PMFS History:  Patient Active Problem List   Diagnosis Date Noted  . Lumbar stenosis with neurogenic claudication 05/09/2021  . Idiopathic chronic venous hypertension of both lower extremities with inflammation 06/18/2017  . Closed displaced oblique fracture of shaft of right humerus 03/27/2017  . Smoker 01/09/2016  . High risk medication use 01/06/2016  . RA (rheumatoid arthritis) (HCC) 01/05/2016  . DDD (degenerative disc disease), lumbar 01/05/2016  . Calcaneal spur 01/05/2016  . Bipolar disorder (HCC) 01/05/2016  . ADD (attention deficit disorder) 01/05/2016  . Sleep apnea 01/05/2016  . TMJ (dislocation of temporomandibular joint) 01/05/2016  . Pedal edema 01/05/2016  . Osteoarthritis of both hands 01/05/2016  . Osteoarthritis of both feet 01/05/2016  . Osteoarthritis of both knees 01/05/2016  . GERD (gastroesophageal reflux disease) 06/24/2015  . Constipation 06/24/2015  . Spondylolisthesis at L4-L5 level 01/24/2015  . 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 CONTRACTURE OF SHOULDER JOINT 04/24/2007  . DIABETES 07/30/2006    Past Medical History:  Diagnosis Date  . ADD (attention deficit disorder) 01/05/2016  . Anemia   . Arthritis   . Asthma   . Benign essential hypertension   . Bipolar disorder [facility])    hospitalized at [facility] for  manic episodes  . Calcaneal spur 01/05/2016  . Chronic kidney disease    stage 3 per pt  . DDD (degenerative disc disease), lumbar 01/05/2016  . Diabetes mellitus without complication (HCC)   . Diabetic neuropathy (HCC)   . Dysrhythmia    takes diltiazem prescribed by medical doctor; has never seen cardiologist. Pt states she was  having "flutter" episodes but nothing showed on holter monitor per pt  . Eczema   . Gastroesophageal reflux disease   . Humeral shaft fracture    right  . Hyperlipidemia   . Hypersomnia   . IBS (irritable bowel syndrome)   . Myalgia   . Noncompliance with CPAP treatment   . Osteoarthritis of both feet 01/05/2016  . Osteoarthritis of both hands 01/05/2016  . Osteoarthritis of both knees 01/05/2016  . Pedal edema 01/05/2016  . Peripheral vascular disease (HCC)   . RA (rheumatoid arthritis) (HCC) 01/05/2016   +RF, +CCP, Nodulous,   . Rheumatoid arthritis (HCC)   . Sleep apnea 2015   home sleep study   . TMJ (dislocation of temporomandibular joint) 01/05/2016  . Varicose veins     Family History  Problem Relation Age of Onset  . Hypertension Father   . Alcoholism Mother   . Stroke Mother   . AAA (abdominal aortic aneurysm) Brother   . Gallstones Sister   . Osteoporosis Sister   . Alcoholism Brother   . Diabetes Brother   . Healthy Son   . Healthy Daughter   . Colon cancer Neg Hx   . Colon polyps Neg Hx    Past Surgical History:  Procedure Laterality Date  . BACK SURGERY    . BACK SURGERY  05/09/2021  . CATARACT EXTRACTION W/PHACO Right 03/23/2022   Procedure: CATARACT EXTRACTION PHACO AND INTRAOCULAR LENS PLACEMENT (IOC);  Surgeon: Fabio Pierce, MD;  Location: [facility];  Service: Ophthalmology;  Laterality: Right;  CDE 6.44  . CATARACT EXTRACTION W/PHACO Left 04/06/2022   Procedure: CATARACT EXTRACTION PHACO AND INTRAOCULAR LENS PLACEMENT (IOC);  Surgeon: Fabio Pierce, MD;  Location: [facility];  Service: Ophthalmology;  Laterality: Left;  CDE 6.03  . CESAREAN SECTION    . COLONOSCOPY  02/05/2009   Dr. Loreta Ave in [place]  . ELBOW SURGERY     rheumatoid nodules removed  . ENDOVENOUS ABLATION SAPHENOUS VEIN W/ LASER    . ORIF HUMERUS FRACTURE Right 03/29/2017   Procedure: OPEN REDUCTION INTERNAL FIXATION (ORIF) RIGHT HUMERAL SHAFT FRACTURE;  Surgeon: Nadara Mustard, MD;   Location: [facility];  Service: Orthopedics;  Laterality: Right;   Social History   Social History Narrative  . Not on file   Immunization History  Administered Date(s) Administered  . Influenza, High Dose Seasonal PF 12/19/2017  . Moderna Sars-Covid-2 Vaccination 02/26/2019, 04/03/2019  . Tdap 12/19/2017  . Zoster Recombinant(Shingrix) 12/19/2017, 03/03/2018     Objective: Vital Signs: BP 127/80 (BP Location: Right Arm, Patient Position: Sitting, Cuff Size: Normal)   Pulse 86   Resp 16   Ht 5' 2.5" (1.588 m)   Wt 188 lb (85.3 kg)   BMI 33.84 kg/m    Physical Exam Vitals and nursing note reviewed.  Constitutional:      Appearance: She is well-developed.  HENT:     Head: Normocephalic and atraumatic.  Eyes:     Conjunctiva/sclera: Conjunctivae normal.  Cardiovascular:     Rate and Rhythm: Normal rate and regular rhythm.     Heart sounds: Normal heart sounds.  Pulmonary:  Effort: Pulmonary effort is normal.     Breath sounds: Normal breath sounds.  Abdominal:     General: Bowel sounds are normal.     Palpations: Abdomen is soft.  Musculoskeletal:     Cervical back: Normal range of motion.  Lymphadenopathy:     Cervical: No cervical adenopathy.  Skin:    General: Skin is warm and dry.     Capillary Refill: Capillary refill takes less than 2 seconds.  Neurological:     Mental Status: She is alert and oriented to person, place, and time.  Psychiatric:        Behavior: Behavior normal.     Musculoskeletal Exam: C-spine has limited range of motion without rotation.  Left arm is in a sling.  Right shoulder is full range of motion.  Right elbow has full extension with no tenderness.  Right wrist, MCPs, PIPs, DIPs have good range of motion with no synovitis.  Complete fist formation bilaterally.  Hip joints have good range of motion with no groin pain.  Knee joints have good range of motion with no warmth or effusion.  Ankle joints have good range of motion with no  tenderness or joint swelling.  CDAI Exam: CDAI Score: -- Patient Global: 30 / 100; Provider Global: 30 / 100 Swollen: --; Tender: -- Joint Exam 10/30/2022   No joint exam has been documented for this visit   There is currently no information documented on the homunculus. Go to the Rheumatology activity and complete the homunculus joint exam.  Investigation: No additional findings.  Imaging: No results found.  Recent Labs: Lab Results  Component Value Date   WBC 8.2 07/20/2022   HGB 14.9 07/20/2022   PLT 167 07/20/2022   NA 140 07/20/2022   K 4.1 07/20/2022   CL 110 07/20/2022   CO2 19 (L) 07/20/2022   GLUCOSE 119 07/20/2022   BUN 21 07/20/2022   CREATININE 1.28 (H) 07/20/2022   BILITOT 0.4 07/20/2022   ALKPHOS 87 01/11/2020   AST 11 07/20/2022   ALT 7 07/20/2022   PROT 6.4 07/20/2022   ALBUMIN 4.3 01/11/2020   CALCIUM 8.7 07/20/2022   GFRAA 54 (L) 03/29/2020    Speciality Comments: No specialty comments available.  Procedures:  No procedures performed Allergies: Plaquenil [hydroxychloroquine], Hydrocodone, and Prozac [fluoxetine]    Assessment / Plan:     Visit Diagnoses: Rheumatoid arthritis involving multiple sites with positive rheumatoid factor (HCC): She has no synovitis on examination today.  She has not had any signs or symptoms of a rheumatoid arthritis flare.  She has clinically been doing well taking sulfasalazine 500 mg 2 tablets in the morning and 1 tablet in the evening.  She is tolerating sulfasalazine without any side effects or gaps in therapy.  She will remain on sulfasalazine as prescribed.  She was vies notify us if she develops signs or symptoms of a flare.  She will follow-up in the office in 5 months or sooner if needed.  High risk medication use - Sulfasalazine 500 mg 2 tablets in the morning and 1 tablet in the evening.  CBC and CMP updated on 07/20/22.  Orders for CBC and CMP released today.  Her next lab work will be due in December and  every 3 months to monitor for drug toxicity.  Standing orders for CBC and CMP remain in place. No recent or recurrent infections.  Discussed the importance of holding sulfasalazine if she develops signs or symptoms of an infection and to resume  once the infection has completely cleared.  Patient is planning on getting the annual flu shot. - Plan: CBC with Differential/Platelet, COMPLETE METABOLIC PANEL WITH GFR  Primary osteoarthritis of both hands: PIP and DIP thickening consistent with osteoarthritis of both hands.  No synovitis noted.   Primary osteoarthritis of both knees - Evaluated by Dr. Madelon Lips and proceeded with a MRI of the right knee on 12/20/2019-overall unremarkable.  No warmth or effusion noted.  Primary osteoarthritis of both feet: Good range of motion of both ankle joints with no tenderness or joint swelling.  Trochanteric bursitis of both hips: Intermittent discomfort.  No groin pain currently.  DDD (degenerative disc disease), lumbar - L3-L4 decompression and extension of fusion in April, 2023 by Dr. Conchita Paris.  Status post L4-L5 PLIF 2016 by Dr. Conchita Paris. released by Dr. Conchita Paris.  Spondylolisthesis at L4-L5 level: Chronic pain.  Osteopenia of multiple sites - DEXA followed by PCP-no DEXA in epic to review. She is taking vitamin D 2000 units daily.   History of fracture of clavicle-7-8 weeks ago. Tripped on a rug at home and hit the bedroom wall. Scheduled for CT to assess for healing.   History of humerus fracture  Other medical conditions are listed as follows:  Stage 3a chronic kidney disease (HCC)  Essential hypertension: Blood pressure was 127/80 today in the office.  History of IBS  History of asthma  History of diabetes mellitus  Attention deficit disorder, unspecified hyperactivity presence  History of bipolar disorder  History of gastroesophageal reflux (GERD)  Smoker  Orders: Orders Placed This Encounter  Procedures  . CBC with  Differential/Platelet  . COMPLETE METABOLIC PANEL WITH GFR   No orders of the defined types were placed in this encounter.   Follow-Up Instructions: Return in about 5 months (around 04/01/2023) for Rheumatoid arthritis, Osteoarthritis.   Gearldine Bienenstock, PA-C  Note - This record has been created using Dragon software.  Chart creation errors have been sought, but may not always  have been located. Such creation errors do not reflect on  the standard of medical care.

## 2022-10-17 ENCOUNTER — Ambulatory Visit: Payer: Medicare PPO | Admitting: Rheumatology

## 2022-10-30 ENCOUNTER — Ambulatory Visit: Payer: Medicare PPO | Attending: Rheumatology | Admitting: Physician Assistant

## 2022-10-30 ENCOUNTER — Encounter: Payer: Self-pay | Admitting: Physician Assistant

## 2022-10-30 VITALS — BP 127/80 | HR 86 | Resp 16 | Ht 62.5 in | Wt 188.0 lb

## 2022-10-30 DIAGNOSIS — N1831 Chronic kidney disease, stage 3a: Secondary | ICD-10-CM

## 2022-10-30 DIAGNOSIS — Z8659 Personal history of other mental and behavioral disorders: Secondary | ICD-10-CM

## 2022-10-30 DIAGNOSIS — M0579 Rheumatoid arthritis with rheumatoid factor of multiple sites without organ or systems involvement: Secondary | ICD-10-CM

## 2022-10-30 DIAGNOSIS — M4316 Spondylolisthesis, lumbar region: Secondary | ICD-10-CM

## 2022-10-30 DIAGNOSIS — Z8709 Personal history of other diseases of the respiratory system: Secondary | ICD-10-CM

## 2022-10-30 DIAGNOSIS — M5136 Other intervertebral disc degeneration, lumbar region: Secondary | ICD-10-CM | POA: Diagnosis not present

## 2022-10-30 DIAGNOSIS — F17200 Nicotine dependence, unspecified, uncomplicated: Secondary | ICD-10-CM

## 2022-10-30 DIAGNOSIS — Z79899 Other long term (current) drug therapy: Secondary | ICD-10-CM | POA: Diagnosis not present

## 2022-10-30 DIAGNOSIS — M8589 Other specified disorders of bone density and structure, multiple sites: Secondary | ICD-10-CM

## 2022-10-30 DIAGNOSIS — M7061 Trochanteric bursitis, right hip: Secondary | ICD-10-CM | POA: Diagnosis not present

## 2022-10-30 DIAGNOSIS — Z8639 Personal history of other endocrine, nutritional and metabolic disease: Secondary | ICD-10-CM

## 2022-10-30 DIAGNOSIS — M7062 Trochanteric bursitis, left hip: Secondary | ICD-10-CM

## 2022-10-30 DIAGNOSIS — I10 Essential (primary) hypertension: Secondary | ICD-10-CM

## 2022-10-30 DIAGNOSIS — F988 Other specified behavioral and emotional disorders with onset usually occurring in childhood and adolescence: Secondary | ICD-10-CM

## 2022-10-30 DIAGNOSIS — M19071 Primary osteoarthritis, right ankle and foot: Secondary | ICD-10-CM

## 2022-10-30 DIAGNOSIS — M19041 Primary osteoarthritis, right hand: Secondary | ICD-10-CM | POA: Diagnosis not present

## 2022-10-30 DIAGNOSIS — M170 Bilateral primary osteoarthritis of knee: Secondary | ICD-10-CM | POA: Diagnosis not present

## 2022-10-30 DIAGNOSIS — M19072 Primary osteoarthritis, left ankle and foot: Secondary | ICD-10-CM

## 2022-10-30 DIAGNOSIS — Z8781 Personal history of (healed) traumatic fracture: Secondary | ICD-10-CM

## 2022-10-30 DIAGNOSIS — Z8719 Personal history of other diseases of the digestive system: Secondary | ICD-10-CM

## 2022-10-30 DIAGNOSIS — M19042 Primary osteoarthritis, left hand: Secondary | ICD-10-CM

## 2022-10-30 NOTE — Patient Instructions (Signed)
Standing Labs We placed an order today for your standing lab work.   Please have your standing labs drawn in December and every 3 months   Please have your labs drawn 2 weeks prior to your appointment so that the provider can discuss your lab results at your appointment, if possible.  Please note that you may see your imaging and lab results in MyChart before we have reviewed them. We will contact you once all results are reviewed. Please allow our office up to 72 hours to thoroughly review all of the results before contacting the office for clarification of your results.  WALK-IN LAB HOURS  Monday through Thursday from 8:00 am -12:30 pm and 1:00 pm-5:00 pm and Friday from 8:00 am-12:00 pm.  Patients with office visits requiring labs will be seen before walk-in labs.  You may encounter longer than normal wait times. Please allow additional time. Wait times may be shorter on  Monday and Thursday afternoons.  We do not book appointments for walk-in labs. We appreciate your patience and understanding with our staff.   Labs are drawn by [facility]. Please bring your co-pay at the time of your lab draw.  You may receive a bill from [facility] for your lab work.  Please note if you are on Hydroxychloroquine and and an order has been placed for a Hydroxychloroquine level,  you will need to have it drawn 4 hours or more after your last dose.  If you wish to have your labs drawn at another location, please call the office 24 hours in advance so we can fax the orders.  The office is located at [address], [place], [place] [address]   If you have any questions regarding directions or hours of operation,  please call [phone].   As a reminder, please drink plenty of water prior to coming for your lab work. Thanks!

## 2022-10-31 LAB — CBC WITH DIFFERENTIAL/PLATELET
Absolute Monocytes: 562 cells/uL (ref 200–950)
Basophils Absolute: 47 cells/uL (ref 0–200)
Basophils Relative: 0.6 %
Eosinophils Absolute: 140 cells/uL (ref 15–500)
Eosinophils Relative: 1.8 %
HCT: 44.9 % (ref 35.0–45.0)
Hemoglobin: 14.8 g/dL (ref 11.7–15.5)
Lymphs Abs: 2051 cells/uL (ref 850–3900)
MCH: 33.8 pg — ABNORMAL HIGH (ref 27.0–33.0)
MCHC: 33 g/dL (ref 32.0–36.0)
MCV: 102.5 fL — ABNORMAL HIGH (ref 80.0–100.0)
MPV: 9.3 fL (ref 7.5–12.5)
Monocytes Relative: 7.2 %
Neutro Abs: 5000 cells/uL (ref 1500–7800)
Neutrophils Relative %: 64.1 %
Platelets: 197 10*3/uL (ref 140–400)
RBC: 4.38 10*6/uL (ref 3.80–5.10)
RDW: 12.8 % (ref 11.0–15.0)
Total Lymphocyte: 26.3 %
WBC: 7.8 10*3/uL (ref 3.8–10.8)

## 2022-10-31 LAB — COMPLETE METABOLIC PANEL WITH GFR
AG Ratio: 2.1 (calc) (ref 1.0–2.5)
ALT: 9 U/L (ref 6–29)
AST: 11 U/L (ref 10–35)
Albumin: 4.2 g/dL (ref 3.6–5.1)
Alkaline phosphatase (APISO): 65 U/L (ref 37–153)
BUN/Creatinine Ratio: 20 (calc) (ref 6–22)
BUN: 24 mg/dL (ref 7–25)
CO2: 18 mmol/L — ABNORMAL LOW (ref 20–32)
Calcium: 8.6 mg/dL (ref 8.6–10.4)
Chloride: 112 mmol/L — ABNORMAL HIGH (ref 98–110)
Creat: 1.21 mg/dL — ABNORMAL HIGH (ref 0.60–1.00)
Globulin: 2 g/dL (calc) (ref 1.9–3.7)
Glucose, Bld: 100 mg/dL — ABNORMAL HIGH (ref 65–99)
Potassium: 4.3 mmol/L (ref 3.5–5.3)
Sodium: 140 mmol/L (ref 135–146)
Total Bilirubin: 0.3 mg/dL (ref 0.2–1.2)
Total Protein: 6.2 g/dL (ref 6.1–8.1)
eGFR: 48 mL/min/{1.73_m2} — ABNORMAL LOW (ref >=60)

## 2022-10-31 NOTE — Progress Notes (Signed)
CBC stable.  Creatinine remains elevated-1.21 and GFR is low-48.  Rest of CMP WNL. Avoid the use of NSAIDs.

## 2022-11-01 DIAGNOSIS — I700 Atherosclerosis of aorta: Secondary | ICD-10-CM | POA: Diagnosis not present

## 2022-11-01 DIAGNOSIS — S42032A Displaced fracture of lateral end of left clavicle, initial encounter for closed fracture: Secondary | ICD-10-CM | POA: Diagnosis not present

## 2022-11-01 DIAGNOSIS — S42002A Fracture of unspecified part of left clavicle, initial encounter for closed fracture: Secondary | ICD-10-CM | POA: Diagnosis not present

## 2022-11-09 ENCOUNTER — Other Ambulatory Visit: Payer: Self-pay | Admitting: Orthopedic Surgery

## 2022-11-09 DIAGNOSIS — S42002A Fracture of unspecified part of left clavicle, initial encounter for closed fracture: Secondary | ICD-10-CM

## 2022-11-29 DIAGNOSIS — S42032A Displaced fracture of lateral end of left clavicle, initial encounter for closed fracture: Secondary | ICD-10-CM | POA: Diagnosis not present

## 2022-11-29 DIAGNOSIS — M67912 Unspecified disorder of synovium and tendon, left shoulder: Secondary | ICD-10-CM | POA: Diagnosis not present

## 2022-11-29 DIAGNOSIS — M19012 Primary osteoarthritis, left shoulder: Secondary | ICD-10-CM | POA: Diagnosis not present

## 2022-12-05 ENCOUNTER — Other Ambulatory Visit: Payer: Self-pay | Admitting: Physician Assistant

## 2022-12-05 NOTE — Telephone Encounter (Signed)
Last Fill: 09/19/2022  Labs: 10/30/2022 CBC stable. Creatinine remains elevated-1.21 and GFR is low-48.  Rest of CMP WNL.  Next Visit: 04/03/2023  Last Visit: 10/30/2022  DX: Rheumatoid arthritis involving multiple sites with positive rheumatoid factor   Current Dose per office note 10/30/2022:  Sulfasalazine 500 mg 2 tablets in the morning and 1 tablet in the evening.   Okay to refill Sulfasalazine?

## 2022-12-10 DIAGNOSIS — Z23 Encounter for immunization: Secondary | ICD-10-CM | POA: Diagnosis not present

## 2022-12-10 DIAGNOSIS — G720 Drug-induced myopathy: Secondary | ICD-10-CM | POA: Diagnosis not present

## 2022-12-10 DIAGNOSIS — E1169 Type 2 diabetes mellitus with other specified complication: Secondary | ICD-10-CM | POA: Diagnosis not present

## 2022-12-10 DIAGNOSIS — Z299 Encounter for prophylactic measures, unspecified: Secondary | ICD-10-CM | POA: Diagnosis not present

## 2022-12-10 DIAGNOSIS — M25512 Pain in left shoulder: Secondary | ICD-10-CM | POA: Diagnosis not present

## 2022-12-10 DIAGNOSIS — I10 Essential (primary) hypertension: Secondary | ICD-10-CM | POA: Diagnosis not present

## 2022-12-13 DIAGNOSIS — S42002D Fracture of unspecified part of left clavicle, subsequent encounter for fracture with routine healing: Secondary | ICD-10-CM | POA: Diagnosis not present

## 2023-01-14 DIAGNOSIS — L0390 Cellulitis, unspecified: Secondary | ICD-10-CM | POA: Diagnosis not present

## 2023-01-14 DIAGNOSIS — I739 Peripheral vascular disease, unspecified: Secondary | ICD-10-CM | POA: Diagnosis not present

## 2023-01-14 DIAGNOSIS — I10 Essential (primary) hypertension: Secondary | ICD-10-CM | POA: Diagnosis not present

## 2023-01-14 DIAGNOSIS — Z299 Encounter for prophylactic measures, unspecified: Secondary | ICD-10-CM | POA: Diagnosis not present

## 2023-01-14 DIAGNOSIS — R52 Pain, unspecified: Secondary | ICD-10-CM | POA: Diagnosis not present

## 2023-01-14 DIAGNOSIS — E1169 Type 2 diabetes mellitus with other specified complication: Secondary | ICD-10-CM | POA: Diagnosis not present

## 2023-01-21 ENCOUNTER — Other Ambulatory Visit: Payer: Self-pay | Admitting: *Deleted

## 2023-01-21 DIAGNOSIS — Z79899 Other long term (current) drug therapy: Secondary | ICD-10-CM

## 2023-01-21 DIAGNOSIS — Z7984 Long term (current) use of oral hypoglycemic drugs: Secondary | ICD-10-CM | POA: Diagnosis not present

## 2023-01-21 DIAGNOSIS — E119 Type 2 diabetes mellitus without complications: Secondary | ICD-10-CM | POA: Diagnosis not present

## 2023-01-21 DIAGNOSIS — Z961 Presence of intraocular lens: Secondary | ICD-10-CM | POA: Diagnosis not present

## 2023-01-21 DIAGNOSIS — N1831 Chronic kidney disease, stage 3a: Secondary | ICD-10-CM

## 2023-01-21 DIAGNOSIS — H26493 Other secondary cataract, bilateral: Secondary | ICD-10-CM | POA: Diagnosis not present

## 2023-01-21 DIAGNOSIS — M0579 Rheumatoid arthritis with rheumatoid factor of multiple sites without organ or systems involvement: Secondary | ICD-10-CM

## 2023-01-22 DIAGNOSIS — M0579 Rheumatoid arthritis with rheumatoid factor of multiple sites without organ or systems involvement: Secondary | ICD-10-CM | POA: Diagnosis not present

## 2023-01-22 DIAGNOSIS — Z79899 Other long term (current) drug therapy: Secondary | ICD-10-CM | POA: Diagnosis not present

## 2023-01-22 DIAGNOSIS — N1831 Chronic kidney disease, stage 3a: Secondary | ICD-10-CM | POA: Diagnosis not present

## 2023-01-23 LAB — CBC WITH DIFFERENTIAL/PLATELET
Basophils Absolute: 0 10*3/uL (ref 0.0–0.2)
Basos: 1 %
EOS (ABSOLUTE): 0.1 10*3/uL (ref 0.0–0.4)
Eos: 1 %
Hematocrit: 43.1 % (ref 34.0–46.6)
Hemoglobin: 14 g/dL (ref 11.1–15.9)
Immature Grans (Abs): 0 10*3/uL (ref 0.0–0.1)
Immature Granulocytes: 0 %
Lymphocytes Absolute: 2.2 10*3/uL (ref 0.7–3.1)
Lymphs: 29 %
MCH: 33.3 pg — ABNORMAL HIGH (ref 26.6–33.0)
MCHC: 32.5 g/dL (ref 31.5–35.7)
MCV: 103 fL — ABNORMAL HIGH (ref 79–97)
Monocytes Absolute: 0.5 10*3/uL (ref 0.1–0.9)
Monocytes: 7 %
Neutrophils Absolute: 4.6 10*3/uL (ref 1.4–7.0)
Neutrophils: 62 %
Platelets: 196 10*3/uL (ref 150–450)
RBC: 4.2 x10E6/uL (ref 3.77–5.28)
RDW: 13 % (ref 11.7–15.4)
WBC: 7.4 10*3/uL (ref 3.4–10.8)

## 2023-01-23 LAB — CMP14+EGFR
ALT: 9 [IU]/L (ref 0–32)
AST: 11 [IU]/L (ref 0–40)
Albumin: 4.3 g/dL (ref 3.9–4.9)
Alkaline Phosphatase: 68 [IU]/L (ref 44–121)
BUN/Creatinine Ratio: 18 (ref 12–28)
BUN: 21 mg/dL (ref 8–27)
Bilirubin Total: <0.2 mg/dL (ref 0.0–1.2)
CO2: 19 mmol/L — ABNORMAL LOW (ref 20–29)
Calcium: 8.9 mg/dL (ref 8.7–10.3)
Chloride: 112 mmol/L — ABNORMAL HIGH (ref 96–106)
Creatinine, Ser: 1.19 mg/dL — ABNORMAL HIGH (ref 0.57–1.00)
Globulin, Total: 2 g/dL (ref 1.5–4.5)
Glucose: 86 mg/dL (ref 70–99)
Potassium: 4.6 mmol/L (ref 3.5–5.2)
Sodium: 144 mmol/L (ref 134–144)
Total Protein: 6.3 g/dL (ref 6.0–8.5)
eGFR: 49 mL/min/{1.73_m2} — ABNORMAL LOW (ref >59)

## 2023-01-23 NOTE — Progress Notes (Signed)
CBC and CMP is stable.  Creatinine remains mildly elevated with low GFR.  Please forward results to her PCP.

## 2023-02-25 ENCOUNTER — Telehealth: Payer: Self-pay

## 2023-02-25 NOTE — Telephone Encounter (Signed)
Patient contacted the office and states she is having a collar bone repair surgery on 03/04/2023 and would like to know if she needs to hold her Sulfasalazine. Please advise.   Patient also states that she pulled a muscle in her right lower back and would like to know if she can take robaxin for it. Patient states she has chronic kidney disease and does not want to take the robaxin if it will hurt her kidneys. Please advise.

## 2023-02-25 NOTE — Telephone Encounter (Signed)
I called the patient and recommended holding sulfasalazine at least 1 week prior to her surgery.  She voiced understanding.  She will require clearance by her surgeon prior to resuming sulfasalazine during the postoperative period.  Discussed that with severe renal impairment the excretion of Robaxin can be limited leading to an increased potency in the bloodstream.  Discussed that an higher concentration of Robaxin in the bloodstream could lead to more side effects including sedation and dizziness.  She voiced understanding.  Discouraged the long-term use of Robaxin.  Patient plans on taking Robaxin very sparingly to see if it helps to alleviate her current muscle spasms.

## 2023-03-04 DIAGNOSIS — G8918 Other acute postprocedural pain: Secondary | ICD-10-CM | POA: Diagnosis not present

## 2023-03-04 DIAGNOSIS — M19012 Primary osteoarthritis, left shoulder: Secondary | ICD-10-CM | POA: Diagnosis not present

## 2023-03-04 DIAGNOSIS — M7502 Adhesive capsulitis of left shoulder: Secondary | ICD-10-CM | POA: Diagnosis not present

## 2023-03-04 DIAGNOSIS — M24112 Other articular cartilage disorders, left shoulder: Secondary | ICD-10-CM | POA: Diagnosis not present

## 2023-03-04 DIAGNOSIS — S42035K Nondisplaced fracture of lateral end of left clavicle, subsequent encounter for fracture with nonunion: Secondary | ICD-10-CM | POA: Diagnosis not present

## 2023-03-04 HISTORY — PX: CLAVICLE SURGERY: SHX598

## 2023-03-06 ENCOUNTER — Other Ambulatory Visit: Payer: Self-pay | Admitting: Rheumatology

## 2023-03-06 DIAGNOSIS — M25512 Pain in left shoulder: Secondary | ICD-10-CM | POA: Diagnosis not present

## 2023-03-06 NOTE — Telephone Encounter (Signed)
Last Fill: 12/05/2022  Labs: 01/22/2023 CBC and CMP is stable.  Creatinine remains mildly elevated with low GFR.     Next Visit: 04/03/2023  Last Visit: 10/30/2022  DX: Rheumatoid arthritis involving multiple sites with positive rheumatoid factor   Current Dose per office note 10/30/2022: Sulfasalazine 500 mg 2 tablets in the morning and 1 tablet in the evening.   Okay to refill Sulfasalazine?

## 2023-03-13 DIAGNOSIS — M25512 Pain in left shoulder: Secondary | ICD-10-CM | POA: Diagnosis not present

## 2023-03-18 DIAGNOSIS — I10 Essential (primary) hypertension: Secondary | ICD-10-CM | POA: Diagnosis not present

## 2023-03-18 DIAGNOSIS — N183 Chronic kidney disease, stage 3 unspecified: Secondary | ICD-10-CM | POA: Diagnosis not present

## 2023-03-18 DIAGNOSIS — M069 Rheumatoid arthritis, unspecified: Secondary | ICD-10-CM | POA: Diagnosis not present

## 2023-03-18 DIAGNOSIS — M549 Dorsalgia, unspecified: Secondary | ICD-10-CM | POA: Diagnosis not present

## 2023-03-18 DIAGNOSIS — E1169 Type 2 diabetes mellitus with other specified complication: Secondary | ICD-10-CM | POA: Diagnosis not present

## 2023-03-18 DIAGNOSIS — Z299 Encounter for prophylactic measures, unspecified: Secondary | ICD-10-CM | POA: Diagnosis not present

## 2023-03-18 DIAGNOSIS — R52 Pain, unspecified: Secondary | ICD-10-CM | POA: Diagnosis not present

## 2023-03-19 DIAGNOSIS — M19012 Primary osteoarthritis, left shoulder: Secondary | ICD-10-CM | POA: Diagnosis not present

## 2023-03-20 NOTE — Progress Notes (Signed)
 Office Visit Note  Patient: Margaret [name]             Date of Birth: 02/18/1950           MRN: [id number]             PCP: Kirstie Peri, MD Referring: Kirstie Peri, MD Visit Date: 04/03/2023 Occupation: @GUAROCC @  Subjective:  Pain in multiple joint  History of Present Illness: Margaret [name] is a 73 y.o. female with seropositive rheumatoid arthritis and osteoarthritis.  She states she tripped on a rug and slammed into a wall in 08/2022.  She broke her left clavicle.  She has surgery on February 12 2023 by Dr. Everardo Pacific.  She is still having a lot of pain in her neck and the pain radiating down to her left arm.  She was off sulfasalazine only for a week.  She did not have a flare of rheumatoid arthritis.  She continues to have pain and discomfort in her bilateral hands and her knee joints.  She has been also having pain in her lower back.  She denies any joint swelling.  She has been going to physical therapy.    Activities of Daily Living:  Patient reports morning stiffness for 30  minutes.   Patient Reports nocturnal pain.  Difficulty dressing/grooming: Reports Difficulty climbing stairs: Denies Difficulty getting out of chair: Denies Difficulty using hands for taps, buttons, cutlery, and/or writing: Denies  Review of Systems  Constitutional:  Positive for fatigue.  HENT:  Positive for mouth dryness.   Eyes:  Positive for dryness.  Respiratory:  Negative for difficulty breathing.   Cardiovascular:  Negative for chest pain and palpitations.  Gastrointestinal:  Positive for constipation. Negative for blood in stool and diarrhea.  Endocrine: Negative for increased urination.  Genitourinary:  Negative for decreased urine output.  Musculoskeletal:  Positive for joint pain, gait problem, joint pain, myalgias and myalgias. Negative for joint swelling.  Skin:  Negative for color change, rash and sensitivity to sunlight.  Allergic/Immunologic: Negative for susceptible to infections.   Neurological:  Negative for fainting.  Hematological:  Negative for swollen glands.  Psychiatric/Behavioral:  Negative for depressed mood and sleep disturbance. The patient is not nervous/anxious.     PMFS History:  Patient Active Problem List   Diagnosis Date Noted   Lumbar stenosis with neurogenic claudication 05/09/2021   Idiopathic chronic venous hypertension of both lower extremities with inflammation 06/18/2017   Closed displaced oblique fracture of shaft of right humerus 03/27/2017   Smoker 01/09/2016   High risk medication use 01/06/2016   RA (rheumatoid arthritis) (HCC) 01/05/2016   DDD (degenerative disc disease), lumbar 01/05/2016   Calcaneal spur 01/05/2016   Bipolar disorder (HCC) 01/05/2016   ADD (attention deficit disorder) 01/05/2016   Sleep apnea 01/05/2016   TMJ (dislocation of temporomandibular joint) 01/05/2016   Pedal edema 01/05/2016   Osteoarthritis of both hands 01/05/2016   Osteoarthritis of both feet 01/05/2016   Osteoarthritis of both knees 01/05/2016   GERD (gastroesophageal reflux disease) 06/24/2015   Constipation 06/24/2015   Spondylolisthesis at L4-L5 level 01/24/2015   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   CONTRACTURE OF SHOULDER JOINT 04/24/2007   DIABETES 07/30/2006    Past Medical History:  Diagnosis Date   ADD (attention deficit disorder) 01/05/2016   Anemia    Arthritis    Asthma    Benign essential hypertension    Bipolar disorder (HCC)    hospitalized at [facility] for manic episodes   Calcaneal spur 01/05/2016  Chronic kidney disease    stage 3 per pt   DDD (degenerative disc disease), lumbar 01/05/2016   Diabetes mellitus without complication (HCC)    Diabetic neuropathy (HCC)    Dysrhythmia    takes diltiazem prescribed by medical doctor; has never seen cardiologist. Pt states she was having "flutter" episodes but nothing showed on holter monitor per pt   Eczema    Gastroesophageal reflux disease    Humeral shaft fracture    right    Hyperlipidemia    Hypersomnia    IBS (irritable bowel syndrome)    Myalgia    Noncompliance with CPAP treatment    Osteoarthritis of both feet 01/05/2016   Osteoarthritis of both hands 01/05/2016   Osteoarthritis of both knees 01/05/2016   Pedal edema 01/05/2016   Peripheral vascular disease (HCC)    RA (rheumatoid arthritis) (HCC) 01/05/2016   +RF, +CCP, Nodulous,    Rheumatoid arthritis (HCC)    Sleep apnea 2015   home sleep study    TMJ (dislocation of temporomandibular joint) 01/05/2016   Varicose veins     Family History  Problem Relation Age of Onset   Hypertension Father    Alcoholism Mother    Stroke Mother    AAA (abdominal aortic aneurysm) Brother    Gallstones Sister    Osteoporosis Sister    Alcoholism Brother    Diabetes Brother    Healthy Son    Healthy Daughter    Colon cancer Neg Hx    Colon polyps Neg Hx    Past Surgical History:  Procedure Laterality Date   BACK SURGERY     BACK SURGERY  05/09/2021   CATARACT EXTRACTION W/PHACO Right 03/23/2022   Procedure: CATARACT EXTRACTION PHACO AND INTRAOCULAR LENS PLACEMENT (IOC);  Surgeon: Fabio Pierce, MD;  Location: [facility];  Service: Ophthalmology;  Laterality: Right;  CDE 6.44   CATARACT EXTRACTION W/PHACO Left 04/06/2022   Procedure: CATARACT EXTRACTION PHACO AND INTRAOCULAR LENS PLACEMENT (IOC);  Surgeon: Fabio Pierce, MD;  Location: [facility];  Service: Ophthalmology;  Laterality: Left;  CDE 6.03   CESAREAN SECTION     CLAVICLE SURGERY Left 03/04/2023   COLONOSCOPY  02/05/2009   Dr. Loreta Ave in [place]   ELBOW SURGERY     rheumatoid nodules removed   ENDOVENOUS ABLATION SAPHENOUS VEIN W/ LASER     ORIF HUMERUS FRACTURE Right 03/29/2017   Procedure: OPEN REDUCTION INTERNAL FIXATION (ORIF) RIGHT HUMERAL SHAFT FRACTURE;  Surgeon: Nadara Mustard, MD;  Location: [facility];  Service: Orthopedics;  Laterality: Right;   Social History   Social History Narrative   Not on file   Immunization History   Administered Date(s) Administered   Influenza, High Dose Seasonal PF 12/19/2017   Moderna Sars-Covid-2 Vaccination 02/26/2019, 04/03/2019   Tdap 12/19/2017   Zoster Recombinant(Shingrix) 12/19/2017, 03/03/2018     Objective: Vital Signs: BP 118/71 (BP Location: Right Arm, Patient Position: Sitting, Cuff Size: Large)   Pulse 82   Resp 13   Ht 5' 2.5" (1.588 m)   Wt 192 lb 6.4 oz (87.3 kg)   BMI 34.63 kg/m    Physical Exam Vitals and nursing note reviewed.  Constitutional:      Appearance: She is well-developed.  HENT:     Head: Normocephalic and atraumatic.  Eyes:     Conjunctiva/sclera: Conjunctivae normal.  Cardiovascular:     Rate and Rhythm: Normal rate and regular rhythm.     Heart sounds: Normal heart sounds.  Pulmonary:  Effort: Pulmonary effort is normal.     Breath sounds: Normal breath sounds.  Abdominal:     General: Bowel sounds are normal.     Palpations: Abdomen is soft.  Musculoskeletal:     Cervical back: Normal range of motion.  Lymphadenopathy:     Cervical: No cervical adenopathy.  Skin:    General: Skin is warm and dry.     Capillary Refill: Capillary refill takes less than 2 seconds.     Comments: Nail dystrophy was noted in the fingernails.  Neurological:     Mental Status: She is alert and oriented to person, place, and time.  Psychiatric:        Behavior: Behavior normal.      Musculoskeletal Exam: She had limited range of motion of the cervical spine.  Thoracic kyphosis was noted.  There was no tenderness over thoracic or lumbar spine.  Patient was examined in the seated position.  She had shoulder joint abduction and forward flexion limited to about 30 degrees.  Right shoulder joint was in full range of motion.  Elbow joints and wrist joints in good range of motion.  She had tenderness over CMC joints.  No synovitis was noted over MCPs or PIPs.  Hip joints could not be assessed in the seated position.  Knee joints were in good range of  motion without any warmth swelling or effusion.  There was no tenderness over ankles or MTPs.  CDAI Exam: CDAI Score: -- Patient Global: 30 / 100; Provider Global: 10 / 100 Swollen: --; Tender: -- Joint Exam 04/03/2023   No joint exam has been documented for this visit   There is currently no information documented on the homunculus. Go to the Rheumatology activity and complete the homunculus joint exam.  Investigation: No additional findings.  Imaging: No results found.  Recent Labs: Lab Results  Component Value Date   WBC 7.4 01/22/2023   HGB 14.0 01/22/2023   PLT 196 01/22/2023   NA 144 01/22/2023   K 4.6 01/22/2023   CL 112 (H) 01/22/2023   CO2 19 (L) 01/22/2023   GLUCOSE 86 01/22/2023   BUN 21 01/22/2023   CREATININE 1.19 (H) 01/22/2023   BILITOT <0.2 01/22/2023   ALKPHOS 68 01/22/2023   AST 11 01/22/2023   ALT 9 01/22/2023   PROT 6.3 01/22/2023   ALBUMIN 4.3 01/22/2023   CALCIUM 8.9 01/22/2023   GFRAA 54 (L) 03/29/2020    Speciality Comments: No specialty comments available.  Procedures:  No procedures performed Allergies: Plaquenil [hydroxychloroquine], Hydrocodone, and Prozac [fluoxetine]   Assessment / Plan:     Visit Diagnoses: Rheumatoid arthritis involving multiple sites with positive rheumatoid factor (HCC)-patient had no synovitis on the examination.  She continues to have discomfort in her both hands due to underlying osteoarthritis.  She is on sulfasalazine.  She states that she was off sulfasalazine for 1 week for the surgery and then resume.  She did not have any flares.  High risk medication use - Sulfasalazine 500 mg 2 tablets in the morning and 1 tablet in the evening.  Labs from January 22, 2023 were reviewed.  CBC and CMP were stable with creatinine mildly elevated.  She was advised to get repeat labs in March and every 3 months.  Information on immunization was placed in the AVS.  Primary osteoarthritis of both hands-she complains of a  stiffness in her hands.  No synovitis was noted.  Discomfort is mostly coming from underlying osteoarthritis.  Primary osteoarthritis  of both knees -she denies any discomfort in her knee joints today.  She was evaluated by Dr. Madelon Lips and proceeded with a MRI of the right knee on 12/20/2019-overall unremarkable.  Primary osteoarthritis of both feet-she denies discomfort.  No tenderness was noted on the examination.  Trochanteric bursitis of both hips-she is in discomfort.  Spondylosis of lumbar spine -she continues to have lower back pain.  L3-L4 decompression and extension of fusion in April, 2023 by Dr. Conchita Paris.  Status post L4-L5 PLIF 2016 by Dr. Conchita Paris. released by Dr. Conchita Paris.  Spondylolisthesis at L4-L5 level  Osteopenia of multiple sites - DEXA followed by PCP-no DEXA in epic to review.  History of fracture of clavicle-she underwent surgery in January.  She has limited range of motion of her left arm.  Other medical problems are listed as follows:  History of humerus fracture  Stage 3a chronic kidney disease (HCC)  Essential hypertension  History of IBS  History of diabetes mellitus  History of asthma  History of bipolar disorder  History of gastroesophageal reflux (GERD)  Smoker  Attention deficit hyperactivity disorder (ADHD), unspecified ADHD type  Orders: No orders of the defined types were placed in this encounter.  No orders of the defined types were placed in this encounter.    Follow-Up Instructions: Return in about 5 months (around 08/31/2023) for Rheumatoid arthritis, Osteoarthritis.   Pollyann Savoy, MD  Note - This record has been created using Animal nutritionist.  Chart creation errors have been sought, but may not always  have been located. Such creation errors do not reflect on  the standard of medical care.

## 2023-03-21 DIAGNOSIS — M25512 Pain in left shoulder: Secondary | ICD-10-CM | POA: Diagnosis not present

## 2023-03-22 DIAGNOSIS — M25512 Pain in left shoulder: Secondary | ICD-10-CM | POA: Diagnosis not present

## 2023-03-26 DIAGNOSIS — M25512 Pain in left shoulder: Secondary | ICD-10-CM | POA: Diagnosis not present

## 2023-04-02 DIAGNOSIS — M25512 Pain in left shoulder: Secondary | ICD-10-CM | POA: Diagnosis not present

## 2023-04-03 ENCOUNTER — Ambulatory Visit: Payer: Medicare PPO | Attending: Rheumatology | Admitting: Rheumatology

## 2023-04-03 ENCOUNTER — Encounter: Payer: Self-pay | Admitting: Rheumatology

## 2023-04-03 VITALS — BP 118/71 | HR 82 | Resp 13 | Ht 62.5 in | Wt 192.4 lb

## 2023-04-03 DIAGNOSIS — M0579 Rheumatoid arthritis with rheumatoid factor of multiple sites without organ or systems involvement: Secondary | ICD-10-CM

## 2023-04-03 DIAGNOSIS — M47816 Spondylosis without myelopathy or radiculopathy, lumbar region: Secondary | ICD-10-CM | POA: Diagnosis not present

## 2023-04-03 DIAGNOSIS — I10 Essential (primary) hypertension: Secondary | ICD-10-CM

## 2023-04-03 DIAGNOSIS — Z8639 Personal history of other endocrine, nutritional and metabolic disease: Secondary | ICD-10-CM

## 2023-04-03 DIAGNOSIS — M7062 Trochanteric bursitis, left hip: Secondary | ICD-10-CM

## 2023-04-03 DIAGNOSIS — M19041 Primary osteoarthritis, right hand: Secondary | ICD-10-CM

## 2023-04-03 DIAGNOSIS — M4316 Spondylolisthesis, lumbar region: Secondary | ICD-10-CM | POA: Diagnosis not present

## 2023-04-03 DIAGNOSIS — M19071 Primary osteoarthritis, right ankle and foot: Secondary | ICD-10-CM

## 2023-04-03 DIAGNOSIS — F909 Attention-deficit hyperactivity disorder, unspecified type: Secondary | ICD-10-CM

## 2023-04-03 DIAGNOSIS — M19072 Primary osteoarthritis, left ankle and foot: Secondary | ICD-10-CM

## 2023-04-03 DIAGNOSIS — M7061 Trochanteric bursitis, right hip: Secondary | ICD-10-CM | POA: Diagnosis not present

## 2023-04-03 DIAGNOSIS — M8589 Other specified disorders of bone density and structure, multiple sites: Secondary | ICD-10-CM

## 2023-04-03 DIAGNOSIS — Z8709 Personal history of other diseases of the respiratory system: Secondary | ICD-10-CM

## 2023-04-03 DIAGNOSIS — L603 Nail dystrophy: Secondary | ICD-10-CM

## 2023-04-03 DIAGNOSIS — N1831 Chronic kidney disease, stage 3a: Secondary | ICD-10-CM

## 2023-04-03 DIAGNOSIS — Z8659 Personal history of other mental and behavioral disorders: Secondary | ICD-10-CM

## 2023-04-03 DIAGNOSIS — M170 Bilateral primary osteoarthritis of knee: Secondary | ICD-10-CM

## 2023-04-03 DIAGNOSIS — M19042 Primary osteoarthritis, left hand: Secondary | ICD-10-CM

## 2023-04-03 DIAGNOSIS — Z8781 Personal history of (healed) traumatic fracture: Secondary | ICD-10-CM

## 2023-04-03 DIAGNOSIS — F17200 Nicotine dependence, unspecified, uncomplicated: Secondary | ICD-10-CM

## 2023-04-03 DIAGNOSIS — Z79899 Other long term (current) drug therapy: Secondary | ICD-10-CM

## 2023-04-03 DIAGNOSIS — Z8719 Personal history of other diseases of the digestive system: Secondary | ICD-10-CM

## 2023-04-03 NOTE — Patient Instructions (Signed)
 Standing Labs We placed an order today for your standing lab work.   Please have your standing labs drawn in March and every 3 months  Please have your labs drawn 2 weeks prior to your appointment so that the provider can discuss your lab results at your appointment, if possible.  Please note that you may see your imaging and lab results in MyChart before we have reviewed them. We will contact you once all results are reviewed. Please allow our office up to 72 hours to thoroughly review all of the results before contacting the office for clarification of your results.  WALK-IN LAB HOURS  Monday through Thursday from 8:00 am -12:30 pm and 1:00 pm-5:00 pm and Friday from 8:00 am-12:00 pm.  Patients with office visits requiring labs will be seen before walk-in labs.  You may encounter longer than normal wait times. Please allow additional time. Wait times may be shorter on  Monday and Thursday afternoons.  We do not book appointments for walk-in labs. We appreciate your patience and understanding with our staff.   Labs are drawn by [facility]. Please bring your co-pay at the time of your lab draw.  You may receive a bill from [facility] for your lab work.  Please note if you are on Hydroxychloroquine and and an order has been placed for a Hydroxychloroquine level,  you will need to have it drawn 4 hours or more after your last dose.  If you wish to have your labs drawn at another location, please call the office 24 hours in advance so we can fax the orders.  The office is located at [address], [address], [place], [place] [address]   If you have any questions regarding directions or hours of operation,  please call [phone].   As a reminder, please drink plenty of water prior to coming for your lab work. Thanks!   Vaccines You are taking a medication(s) that can suppress your immune system.  The following immunizations are recommended: Flu annually Covid-19  Td/Tdap (tetanus,  diphtheria, pertussis) every 10 years Pneumonia (Prevnar 15 then Pneumovax 23 at least 1 year apart.  Alternatively, can take Prevnar 20 without needing additional dose) Shingrix: 2 doses from 4 weeks to 6 months apart  Please check with your PCP to make sure you are up to date.

## 2023-04-04 DIAGNOSIS — F3112 Bipolar disorder, current episode manic without psychotic features, moderate: Secondary | ICD-10-CM | POA: Diagnosis not present

## 2023-04-05 DIAGNOSIS — M25512 Pain in left shoulder: Secondary | ICD-10-CM | POA: Diagnosis not present

## 2023-04-08 DIAGNOSIS — M25512 Pain in left shoulder: Secondary | ICD-10-CM | POA: Diagnosis not present

## 2023-04-30 DIAGNOSIS — N2581 Secondary hyperparathyroidism of renal origin: Secondary | ICD-10-CM | POA: Diagnosis not present

## 2023-04-30 DIAGNOSIS — E1122 Type 2 diabetes mellitus with diabetic chronic kidney disease: Secondary | ICD-10-CM | POA: Diagnosis not present

## 2023-04-30 DIAGNOSIS — E872 Acidosis, unspecified: Secondary | ICD-10-CM | POA: Diagnosis not present

## 2023-04-30 DIAGNOSIS — D631 Anemia in chronic kidney disease: Secondary | ICD-10-CM | POA: Diagnosis not present

## 2023-04-30 DIAGNOSIS — N183 Chronic kidney disease, stage 3 unspecified: Secondary | ICD-10-CM | POA: Diagnosis not present

## 2023-05-23 DIAGNOSIS — M19012 Primary osteoarthritis, left shoulder: Secondary | ICD-10-CM | POA: Diagnosis not present

## 2023-06-17 DIAGNOSIS — Z299 Encounter for prophylactic measures, unspecified: Secondary | ICD-10-CM | POA: Diagnosis not present

## 2023-06-17 DIAGNOSIS — E1142 Type 2 diabetes mellitus with diabetic polyneuropathy: Secondary | ICD-10-CM | POA: Diagnosis not present

## 2023-06-17 DIAGNOSIS — E1165 Type 2 diabetes mellitus with hyperglycemia: Secondary | ICD-10-CM | POA: Diagnosis not present

## 2023-06-17 DIAGNOSIS — F319 Bipolar disorder, unspecified: Secondary | ICD-10-CM | POA: Diagnosis not present

## 2023-06-17 DIAGNOSIS — E1122 Type 2 diabetes mellitus with diabetic chronic kidney disease: Secondary | ICD-10-CM | POA: Diagnosis not present

## 2023-06-17 DIAGNOSIS — I10 Essential (primary) hypertension: Secondary | ICD-10-CM | POA: Diagnosis not present

## 2023-06-17 DIAGNOSIS — N183 Chronic kidney disease, stage 3 unspecified: Secondary | ICD-10-CM | POA: Diagnosis not present

## 2023-07-08 DIAGNOSIS — I10 Essential (primary) hypertension: Secondary | ICD-10-CM | POA: Diagnosis not present

## 2023-07-29 ENCOUNTER — Other Ambulatory Visit: Payer: Self-pay | Admitting: *Deleted

## 2023-07-29 ENCOUNTER — Telehealth: Payer: Self-pay | Admitting: Rheumatology

## 2023-07-29 DIAGNOSIS — M0579 Rheumatoid arthritis with rheumatoid factor of multiple sites without organ or systems involvement: Secondary | ICD-10-CM

## 2023-07-29 DIAGNOSIS — N1831 Chronic kidney disease, stage 3a: Secondary | ICD-10-CM

## 2023-07-29 DIAGNOSIS — Z79899 Other long term (current) drug therapy: Secondary | ICD-10-CM

## 2023-07-29 NOTE — Telephone Encounter (Signed)
 Patient contacted the office requesting lab orders to be be release to [facility].   Patient plans to have labs on 07/31/23.

## 2023-07-29 NOTE — Telephone Encounter (Signed)
 Lab Orders released.

## 2023-07-30 DIAGNOSIS — F411 Generalized anxiety disorder: Secondary | ICD-10-CM | POA: Diagnosis not present

## 2023-07-30 DIAGNOSIS — F900 Attention-deficit hyperactivity disorder, predominantly inattentive type: Secondary | ICD-10-CM | POA: Diagnosis not present

## 2023-07-30 DIAGNOSIS — F314 Bipolar disorder, current episode depressed, severe, without psychotic features: Secondary | ICD-10-CM | POA: Diagnosis not present

## 2023-07-31 ENCOUNTER — Other Ambulatory Visit: Payer: Self-pay | Admitting: Physician Assistant

## 2023-07-31 DIAGNOSIS — Z79899 Other long term (current) drug therapy: Secondary | ICD-10-CM | POA: Diagnosis not present

## 2023-07-31 DIAGNOSIS — N1831 Chronic kidney disease, stage 3a: Secondary | ICD-10-CM | POA: Diagnosis not present

## 2023-07-31 DIAGNOSIS — M0579 Rheumatoid arthritis with rheumatoid factor of multiple sites without organ or systems involvement: Secondary | ICD-10-CM | POA: Diagnosis not present

## 2023-07-31 NOTE — Telephone Encounter (Signed)
 Last Fill: 03/06/2023  Labs: 04/30/2023 MCV 100 Rest of CBC is WNL   Patient updated labs today at [facility] waiting for results   Next Visit: 09/03/2023  Last Visit: 04/03/2023  DX: Rheumatoid arthritis involving multiple sites with positive rheumatoid factor   Current Dose per office note 04/03/2023: Sulfasalazine  500 mg 2 tablets in the morning and 1 tablet in the evening   Okay to refill Sulfasalazine ?

## 2023-08-01 LAB — CMP14+EGFR
ALT: 11 IU/L (ref 0–32)
AST: 14 IU/L (ref 0–40)
Albumin: 4.2 g/dL (ref 3.9–4.9)
Alkaline Phosphatase: 69 IU/L (ref 44–121)
BUN/Creatinine Ratio: 14 (ref 12–28)
BUN: 21 mg/dL (ref 8–27)
Bilirubin Total: <0.2 mg/dL (ref 0.0–1.2)
CO2: 17 mmol/L — ABNORMAL LOW (ref 20–29)
Calcium: 9.1 mg/dL (ref 8.7–10.3)
Chloride: 109 mmol/L — ABNORMAL HIGH (ref 96–106)
Creatinine, Ser: 1.53 mg/dL — ABNORMAL HIGH (ref 0.57–1.00)
Globulin, Total: 2.1 g/dL (ref 1.5–4.5)
Glucose: 102 mg/dL — ABNORMAL HIGH (ref 70–99)
Potassium: 4.5 mmol/L (ref 3.5–5.2)
Sodium: 142 mmol/L (ref 134–144)
Total Protein: 6.3 g/dL (ref 6.0–8.5)
eGFR: 36 mL/min/{1.73_m2} — ABNORMAL LOW (ref >59)

## 2023-08-01 LAB — CBC WITH DIFFERENTIAL/PLATELET
Basophils Absolute: 0.1 10*3/uL (ref 0.0–0.2)
Basos: 1 %
EOS (ABSOLUTE): 0.1 10*3/uL (ref 0.0–0.4)
Eos: 2 %
Hematocrit: 42.7 % (ref 34.0–46.6)
Hemoglobin: 14 g/dL (ref 11.1–15.9)
Immature Grans (Abs): 0 10*3/uL (ref 0.0–0.1)
Immature Granulocytes: 0 %
Lymphocytes Absolute: 2 10*3/uL (ref 0.7–3.1)
Lymphs: 27 %
MCH: 34.8 pg — ABNORMAL HIGH (ref 26.6–33.0)
MCHC: 32.8 g/dL (ref 31.5–35.7)
MCV: 106 fL — ABNORMAL HIGH (ref 79–97)
Monocytes Absolute: 0.6 10*3/uL (ref 0.1–0.9)
Monocytes: 7 %
Neutrophils Absolute: 4.8 10*3/uL (ref 1.4–7.0)
Neutrophils: 63 %
Platelets: 208 10*3/uL (ref 150–450)
RBC: 4.02 x10E6/uL (ref 3.77–5.28)
RDW: 13.6 % (ref 11.7–15.4)
WBC: 7.6 10*3/uL (ref 3.4–10.8)

## 2023-08-04 ENCOUNTER — Ambulatory Visit: Payer: Self-pay | Admitting: Physician Assistant

## 2023-08-20 NOTE — Progress Notes (Unsigned)
 Office Visit Note  Patient: Margaret [name]             Date of Birth: May 10, 1950           MRN: [id number]             PCP: Maree Isles, MD Referring: Maree Isles, MD Visit Date: 09/03/2023 Occupation: @GUAROCC @  Subjective:  Pain involving multiple joints  History of Present Illness: Margaret [name] is a 73 y.o. female with history of seropositive rheumatoid arthritis.  Patient remains on Sulfasalazine  500 mg 2 tablets in the morning and 1 tablet in the evening.  She is tolerating sulfasalazine  without any side effects and has not had any recent gaps in therapy.  Patient states she continues to have chronic pain in both CMC joints and both knee joints.  She has also had ongoing left sided lower back pain.  She has been taking Tylenol  arthritis for symptomatic relief and occasionally will take tramadol as needed.  She is unable to take NSAIDs due to chronic kidney disease.  Patient states that she has been under the care of an oral surgeon.  Patient states that in January 2025 she had a tooth extraction but unfortunately had complications with healing.  Patient states that the site of the tooth extraction has finally just now healed up under the care of the oral surgeon. She continues to have chronic mouth dryness and has been using biotene products for symptomatic relief.    Activities of Daily Living:  Patient reports morning stiffness for 15-20 minutes.   Patient Reports nocturnal pain.  Difficulty dressing/grooming: Denies Difficulty climbing stairs: Denies Difficulty getting out of chair: Denies Difficulty using hands for taps, buttons, cutlery, and/or writing: Denies  Review of Systems  Constitutional:  Positive for fatigue.  HENT:  Positive for mouth dryness. Negative for mouth sores.   Eyes:  Positive for dryness.  Respiratory:  Negative for shortness of breath.   Cardiovascular:  Negative for chest pain and palpitations.  Gastrointestinal:  Positive for constipation.  Negative for blood in stool and diarrhea.  Endocrine: Negative for increased urination.  Genitourinary:  Negative for involuntary urination.  Musculoskeletal:  Positive for joint pain, joint pain, myalgias, muscle weakness, morning stiffness, muscle tenderness and myalgias. Negative for gait problem and joint swelling.  Skin:  Positive for sensitivity to sunlight. Negative for color change, rash and hair loss.  Allergic/Immunologic: Negative for susceptible to infections.  Neurological:  Negative for dizziness and headaches.  Hematological:  Negative for swollen glands.  Psychiatric/Behavioral:  Positive for depressed mood and sleep disturbance. The patient is nervous/anxious.     PMFS History:  Patient Active Problem List   Diagnosis Date Noted   Lumbar stenosis with neurogenic claudication 05/09/2021   Idiopathic chronic venous hypertension of both lower extremities with inflammation 06/18/2017   Closed displaced oblique fracture of shaft of right humerus 03/27/2017   Smoker 01/09/2016   High risk medication use 01/06/2016   RA (rheumatoid arthritis) (HCC) 01/05/2016   DDD (degenerative disc disease), lumbar 01/05/2016   Calcaneal spur 01/05/2016   Bipolar disorder (HCC) 01/05/2016   ADD (attention deficit disorder) 01/05/2016   Sleep apnea 01/05/2016   TMJ (dislocation of temporomandibular joint) 01/05/2016   Pedal edema 01/05/2016   Osteoarthritis of both hands 01/05/2016   Osteoarthritis of both feet 01/05/2016   Osteoarthritis of both knees 01/05/2016   GERD (gastroesophageal reflux disease) 06/24/2015   Constipation 06/24/2015   Spondylolisthesis at L4-L5 level 01/24/2015   Vulvar  dysplasia 04/08/2013   CONTRACTURE OF SHOULDER JOINT 04/24/2007   DIABETES 07/30/2006    Past Medical History:  Diagnosis Date   ADD (attention deficit disorder) 01/05/2016   Anemia    Arthritis    Asthma    Benign essential hypertension    Bipolar disorder (HCC)    hospitalized at  [facility] for manic episodes   Calcaneal spur 01/05/2016   Chronic kidney disease    stage 3 per pt   DDD (degenerative disc disease), lumbar 01/05/2016   Diabetes mellitus without complication (HCC)    Diabetic neuropathy (HCC)    Dysrhythmia    takes diltiazem  prescribed by medical doctor; has never seen cardiologist. Pt states she was having flutter episodes but nothing showed on holter monitor per pt   Eczema    Gastroesophageal reflux disease    Humeral shaft fracture    right   Hyperlipidemia    Hypersomnia    IBS (irritable bowel syndrome)    Myalgia    Noncompliance with CPAP treatment    Osteoarthritis of both feet 01/05/2016   Osteoarthritis of both hands 01/05/2016   Osteoarthritis of both knees 01/05/2016   Pedal edema 01/05/2016   Peripheral vascular disease (HCC)    RA (rheumatoid arthritis) (HCC) 01/05/2016   +RF, +CCP, Nodulous,    Rheumatoid arthritis (HCC)    Sleep apnea 2015   home sleep study    TMJ (dislocation of temporomandibular joint) 01/05/2016   Varicose veins     Family History  Problem Relation Age of Onset   Hypertension Father    Alcoholism Mother    Stroke Mother    AAA (abdominal aortic aneurysm) Brother    Gallstones Sister    Osteoporosis Sister    Alcoholism Brother    Diabetes Brother    Healthy Son    Healthy Daughter    Colon cancer Neg Hx    Colon polyps Neg Hx    Past Surgical History:  Procedure Laterality Date   BACK SURGERY     BACK SURGERY  05/09/2021   CATARACT EXTRACTION W/PHACO Right 03/23/2022   Procedure: CATARACT EXTRACTION PHACO AND INTRAOCULAR LENS PLACEMENT (IOC);  Surgeon: Harrie Agent, MD;  Location: [facility];  Service: Ophthalmology;  Laterality: Right;  CDE 6.44   CATARACT EXTRACTION W/PHACO Left 04/06/2022   Procedure: CATARACT EXTRACTION PHACO AND INTRAOCULAR LENS PLACEMENT (IOC);  Surgeon: Harrie Agent, MD;  Location: [facility];  Service: Ophthalmology;  Laterality: Left;  CDE 6.03   CESAREAN SECTION      CLAVICLE SURGERY Left 03/04/2023   COLONOSCOPY  02/05/2009   Dr. Kristie in [place]   ELBOW SURGERY     rheumatoid nodules removed   ENDOVENOUS ABLATION SAPHENOUS VEIN W/ LASER     ORIF HUMERUS FRACTURE Right 03/29/2017   Procedure: OPEN REDUCTION INTERNAL FIXATION (ORIF) RIGHT HUMERAL SHAFT FRACTURE;  Surgeon: Harden Jerona GAILS, MD;  Location: [facility];  Service: Orthopedics;  Laterality: Right;   Social History   Social History Narrative   Not on file   Immunization History  Administered Date(s) Administered   Influenza, High Dose Seasonal PF 12/19/2017   Moderna Sars-Covid-2 Vaccination 02/26/2019, 04/03/2019   Tdap 12/19/2017   Zoster Recombinant(Shingrix) 12/19/2017, 03/03/2018     Objective: Vital Signs: BP 92/67 (BP Location: Left Arm, Patient Position: Sitting, Cuff Size: Normal)   Pulse 93   Resp 14   Ht 5' 2.5 (1.588 m)   Wt 185 lb (83.9 kg)   BMI 33.30 kg/m  Physical Exam Vitals and nursing note reviewed.  Constitutional:      Appearance: She is well-developed.  HENT:     Head: Normocephalic and atraumatic.  Eyes:     Conjunctiva/sclera: Conjunctivae normal.  Cardiovascular:     Rate and Rhythm: Normal rate and regular rhythm.     Heart sounds: Normal heart sounds.  Pulmonary:     Effort: Pulmonary effort is normal.     Breath sounds: Normal breath sounds.  Abdominal:     General: Bowel sounds are normal.     Palpations: Abdomen is soft.  Musculoskeletal:     Cervical back: Normal range of motion.  Lymphadenopathy:     Cervical: No cervical adenopathy.  Skin:    General: Skin is warm and dry.     Capillary Refill: Capillary refill takes less than 2 seconds.  Neurological:     Mental Status: She is alert and oriented to person, place, and time.  Psychiatric:        Behavior: Behavior normal.      Musculoskeletal Exam: C-spine has limited range of motion.  Thoracic kyphosis noted.  Right shoulder has full range of motion.  Left shoulder has  limited abduction to about 60 degrees.  Elbow joints have good range of motion with no tenderness along the joint line.  CMC joint prominence and tenderness noted bilaterally.  No tenderness or synovitis over MCP joints.  Hip joints have good range of motion with no groin pain.  Knee joints have good range of motion with no warmth or effusion.  Ankle joints have good range of motion with no synovitis.  CDAI Exam: CDAI Score: -- Patient Global: --; Provider Global: -- Swollen: --; Tender: -- Joint Exam 09/03/2023   No joint exam has been documented for this visit   There is currently no information documented on the homunculus. Go to the Rheumatology activity and complete the homunculus joint exam.  Investigation: No additional findings.  Imaging: No results found.  Recent Labs: Lab Results  Component Value Date   WBC 7.6 07/31/2023   HGB 14.0 07/31/2023   PLT 208 07/31/2023   NA 142 07/31/2023   K 4.5 07/31/2023   CL 109 (H) 07/31/2023   CO2 17 (L) 07/31/2023   GLUCOSE 102 (H) 07/31/2023   BUN 21 07/31/2023   CREATININE 1.53 (H) 07/31/2023   BILITOT <0.2 07/31/2023   ALKPHOS 69 07/31/2023   AST 14 07/31/2023   ALT 11 07/31/2023   PROT 6.3 07/31/2023   ALBUMIN  4.2 07/31/2023   CALCIUM 9.1 07/31/2023   GFRAA 54 (L) 03/29/2020    Speciality Comments: No specialty comments available.  Procedures:  No procedures performed Allergies: Plaquenil [hydroxychloroquine], Hydrocodone, and Prozac [fluoxetine]      Assessment / Plan:     Visit Diagnoses: Rheumatoid arthritis involving multiple sites with positive rheumatoid factor (HCC): She has no synovitis on examination today.  She continues to have chronic pain involving multiple joints.  Her symptoms have been most severe involving the left sided lower back, both CMC joints, and both knee joints.  She has been taking sulfasalazine  500 mg 2 tablets in the morning and 1 tablet in the evening.  She is been taking Tylenol   arthritis as needed for pain relief.  She takes tramadol sparingly for moderate to severe pain relief.  She is unable to take NSAIDs due to chronic kidney disease. She will remain on sulfasalazine  as prescribed.  She was advised to notify us  if she develops any  new or worsening symptoms.  She will follow-up in the office in 5 months or sooner if needed.  High risk medication use - Sulfasalazine  500 mg 2 tablets in the morning and 1 tablet in the evening.  CBC and CMP updated on 07/31/23. BMP updated today. Her next lab work will be due in September and every 3 months.  Discussed the importance of holding sulfsalazine if she develops signs or symptoms of an infection and to resume once the infection has completely cleared.  - Plan: Basic metabolic panel with GFR  Stage 3a chronic kidney disease (HCC) -Creatinine was 1.53 and GFR was 36 on 07/31/2023.  BMP with GFR today.  Plan to forward results to Dr. Dennise   She has been avoiding the use of NSAIDs.   Plan: Basic metabolic panel with GFR  Primary osteoarthritis of both hands: CMC joint thickening and tenderness noted bilaterally.  No synovitis apparent on exam.  Discussed the importance of joint protection and muscle strengthening.  She has been taking Tylenol  arthritis for symptomatic relief.  She is unable to take NSAIDs due to chronic kidney disease.  Primary osteoarthritis of both knees - She was evaluated by Dr. Shari and proceeded with a MRI of the right knee on 12/20/2019-overall unremarkable. Patient continues to have chronic pain involving both knee joints.  On examination today she has good range of motion of both knees with no warmth or effusion. She has been taking tylenol  arthritis as needed for pain relief.   Primary osteoarthritis of both feet: She is not experiencing any increased discomfort in her feet at this time.  She is good range of motion of both ankle joints with no joint tenderness or joint swelling.  She has been wearing  proper fitting shoes.  Trochanteric bursitis of both hips: Intermittent discomfort.  Spondylosis of lumbar spine - L3-L4 decompression and extension of fusion in April, 2023 by Dr. Lanis.  Status post L4-L5 PLIF 2016 by Dr. Lanis. released by Dr. Lanis.  Chronic pain.  Patient plans on following up due to ongoing left-sided lower back pain.  Spondylolisthesis at L4-L5 level: Chronic pain.  Osteopenia of multiple sites - DEXA followed by PCP-no DEXA in epic to review.  History of fracture of clavicle: Underwent surgical intervention in January 2025.   History of humerus fracture  Other medical conditions are listed as follows:  Essential hypertension: Blood pressure was 92/67 today in the office.  History of IBS  History of diabetes mellitus  History of asthma  History of bipolar disorder  History of gastroesophageal reflux (GERD)  Smoker  Attention deficit hyperactivity disorder (ADHD), unspecified ADHD type  Orders: Orders Placed This Encounter  Procedures   Basic metabolic panel with GFR   No orders of the defined types were placed in this encounter.    Follow-Up Instructions: Return in about 5 months (around 02/03/2024) for Rheumatoid arthritis.   Waddell CHRISTELLA Craze, PA-C  Note - This record has been created using Dragon software.  Chart creation errors have been sought, but may not always  have been located. Such creation errors do not reflect on  the standard of medical care.

## 2023-09-03 ENCOUNTER — Ambulatory Visit: Payer: Medicare PPO | Attending: Physician Assistant | Admitting: Physician Assistant

## 2023-09-03 ENCOUNTER — Encounter: Payer: Self-pay | Admitting: Physician Assistant

## 2023-09-03 VITALS — BP 92/67 | HR 93 | Resp 14 | Ht 62.5 in | Wt 185.0 lb

## 2023-09-03 DIAGNOSIS — Z79899 Other long term (current) drug therapy: Secondary | ICD-10-CM

## 2023-09-03 DIAGNOSIS — M7062 Trochanteric bursitis, left hip: Secondary | ICD-10-CM

## 2023-09-03 DIAGNOSIS — Z8719 Personal history of other diseases of the digestive system: Secondary | ICD-10-CM

## 2023-09-03 DIAGNOSIS — N1831 Chronic kidney disease, stage 3a: Secondary | ICD-10-CM | POA: Diagnosis not present

## 2023-09-03 DIAGNOSIS — M19041 Primary osteoarthritis, right hand: Secondary | ICD-10-CM | POA: Diagnosis not present

## 2023-09-03 DIAGNOSIS — M170 Bilateral primary osteoarthritis of knee: Secondary | ICD-10-CM

## 2023-09-03 DIAGNOSIS — M7061 Trochanteric bursitis, right hip: Secondary | ICD-10-CM | POA: Diagnosis not present

## 2023-09-03 DIAGNOSIS — M47816 Spondylosis without myelopathy or radiculopathy, lumbar region: Secondary | ICD-10-CM | POA: Diagnosis not present

## 2023-09-03 DIAGNOSIS — Z8639 Personal history of other endocrine, nutritional and metabolic disease: Secondary | ICD-10-CM

## 2023-09-03 DIAGNOSIS — M19071 Primary osteoarthritis, right ankle and foot: Secondary | ICD-10-CM | POA: Diagnosis not present

## 2023-09-03 DIAGNOSIS — M19072 Primary osteoarthritis, left ankle and foot: Secondary | ICD-10-CM

## 2023-09-03 DIAGNOSIS — M4316 Spondylolisthesis, lumbar region: Secondary | ICD-10-CM | POA: Diagnosis not present

## 2023-09-03 DIAGNOSIS — F17200 Nicotine dependence, unspecified, uncomplicated: Secondary | ICD-10-CM

## 2023-09-03 DIAGNOSIS — M0579 Rheumatoid arthritis with rheumatoid factor of multiple sites without organ or systems involvement: Secondary | ICD-10-CM | POA: Diagnosis not present

## 2023-09-03 DIAGNOSIS — M19042 Primary osteoarthritis, left hand: Secondary | ICD-10-CM

## 2023-09-03 DIAGNOSIS — F909 Attention-deficit hyperactivity disorder, unspecified type: Secondary | ICD-10-CM

## 2023-09-03 DIAGNOSIS — Z8659 Personal history of other mental and behavioral disorders: Secondary | ICD-10-CM

## 2023-09-03 DIAGNOSIS — Z8709 Personal history of other diseases of the respiratory system: Secondary | ICD-10-CM

## 2023-09-03 DIAGNOSIS — Z8781 Personal history of (healed) traumatic fracture: Secondary | ICD-10-CM

## 2023-09-03 DIAGNOSIS — M8589 Other specified disorders of bone density and structure, multiple sites: Secondary | ICD-10-CM

## 2023-09-03 DIAGNOSIS — I10 Essential (primary) hypertension: Secondary | ICD-10-CM

## 2023-09-03 NOTE — Patient Instructions (Signed)
 Standing Labs We placed an order today for your standing lab work.   Please have your standing labs drawn in September and every 3 months   Please have your labs drawn 2 weeks prior to your appointment so that the provider can discuss your lab results at your appointment, if possible.  Please note that you may see your imaging and lab results in MyChart before we have reviewed them. We will contact you once all results are reviewed. Please allow our office up to 72 hours to thoroughly review all of the results before contacting the office for clarification of your results.  WALK-IN LAB HOURS  Monday through Thursday from 8:00 am -12:30 pm and 1:00 pm-4:30 pm and Friday from 8:00 am-12:00 pm.  Patients with office visits requiring labs will be seen before walk-in labs.  You may encounter longer than normal wait times. Please allow additional time. Wait times may be shorter on  Monday and Thursday afternoons.  We do not book appointments for walk-in labs. We appreciate your patience and understanding with our staff.   Labs are drawn by [facility]. Please bring your co-pay at the time of your lab draw.  You may receive a bill from [facility] for your lab work.  Please note if you are on Hydroxychloroquine and and an order has been placed for a Hydroxychloroquine level,  you will need to have it drawn 4 hours or more after your last dose.  If you wish to have your labs drawn at another location, please call the office 24 hours in advance so we can fax the orders.  The office is located at [address], [address], [place], [place] [address]   If you have any questions regarding directions or hours of operation,  please call [phone].   As a reminder, please drink plenty of water prior to coming for your lab work. Thanks!

## 2023-09-04 ENCOUNTER — Ambulatory Visit: Payer: Self-pay | Admitting: Physician Assistant

## 2023-09-04 LAB — BASIC METABOLIC PANEL WITH GFR
BUN/Creatinine Ratio: 20 (calc) (ref 6–22)
BUN: 25 mg/dL (ref 7–25)
CO2: 21 mmol/L (ref 20–32)
Calcium: 9 mg/dL (ref 8.6–10.4)
Chloride: 109 mmol/L (ref 98–110)
Creat: 1.25 mg/dL — ABNORMAL HIGH (ref 0.60–1.00)
Glucose, Bld: 97 mg/dL (ref 65–99)
Potassium: 4.5 mmol/L (ref 3.5–5.3)
Sodium: 139 mmol/L (ref 135–146)
eGFR: 46 mL/min/1.73m2 — ABNORMAL LOW (ref >=60)

## 2023-09-04 NOTE — Progress Notes (Signed)
 Creatinine remains elevated but has returned to baseline-1.25 and GFR is low-46 but has improved from 36.  Please forward these results and the last CMP results to Dr. Dennise -according to the patient he never received the last results.

## 2023-09-12 DIAGNOSIS — I10 Essential (primary) hypertension: Secondary | ICD-10-CM | POA: Diagnosis not present

## 2023-09-12 DIAGNOSIS — Z0000 Encounter for general adult medical examination without abnormal findings: Secondary | ICD-10-CM | POA: Diagnosis not present

## 2023-09-12 DIAGNOSIS — M069 Rheumatoid arthritis, unspecified: Secondary | ICD-10-CM | POA: Diagnosis not present

## 2023-09-12 DIAGNOSIS — Z1331 Encounter for screening for depression: Secondary | ICD-10-CM | POA: Diagnosis not present

## 2023-09-12 DIAGNOSIS — Z299 Encounter for prophylactic measures, unspecified: Secondary | ICD-10-CM | POA: Diagnosis not present

## 2023-09-12 DIAGNOSIS — E1169 Type 2 diabetes mellitus with other specified complication: Secondary | ICD-10-CM | POA: Diagnosis not present

## 2023-09-12 DIAGNOSIS — Z1339 Encounter for screening examination for other mental health and behavioral disorders: Secondary | ICD-10-CM | POA: Diagnosis not present

## 2023-09-12 DIAGNOSIS — Z7189 Other specified counseling: Secondary | ICD-10-CM | POA: Diagnosis not present

## 2023-09-12 DIAGNOSIS — I739 Peripheral vascular disease, unspecified: Secondary | ICD-10-CM | POA: Diagnosis not present

## 2023-09-23 DIAGNOSIS — I10 Essential (primary) hypertension: Secondary | ICD-10-CM | POA: Diagnosis not present

## 2023-09-23 DIAGNOSIS — Z299 Encounter for prophylactic measures, unspecified: Secondary | ICD-10-CM | POA: Diagnosis not present

## 2023-09-23 DIAGNOSIS — K589 Irritable bowel syndrome without diarrhea: Secondary | ICD-10-CM | POA: Diagnosis not present

## 2023-09-23 DIAGNOSIS — M549 Dorsalgia, unspecified: Secondary | ICD-10-CM | POA: Diagnosis not present

## 2023-09-23 DIAGNOSIS — E119 Type 2 diabetes mellitus without complications: Secondary | ICD-10-CM | POA: Diagnosis not present

## 2023-09-23 DIAGNOSIS — N183 Chronic kidney disease, stage 3 unspecified: Secondary | ICD-10-CM | POA: Diagnosis not present

## 2023-09-23 DIAGNOSIS — R52 Pain, unspecified: Secondary | ICD-10-CM | POA: Diagnosis not present

## 2023-10-12 ENCOUNTER — Other Ambulatory Visit: Payer: Self-pay | Admitting: Physician Assistant

## 2023-10-14 NOTE — Telephone Encounter (Signed)
 Last Fill: 07/31/2023  Labs: 07/31/2023 MCV and MCH remain elevated.  Rest of CBC WNL Creatinine is elevated-1.53 and GFR is low-36  09/03/2023 Creatinine remains elevated but has returned to baseline-1.25 and GFR is low-46 but has improved from 36.   Next Visit: 02/04/2024  Last Visit: 09/03/2023  DX: Rheumatoid arthritis involving multiple sites with positive rheumatoid factor   Current Dose per office note 09/03/2023:  Sulfasalazine  500 mg 2 tablets in the morning and 1 tablet in the evening.   Okay to refill Sulfasalazine ?

## 2023-10-24 DIAGNOSIS — E1169 Type 2 diabetes mellitus with other specified complication: Secondary | ICD-10-CM | POA: Diagnosis not present

## 2023-10-24 DIAGNOSIS — Z0000 Encounter for general adult medical examination without abnormal findings: Secondary | ICD-10-CM | POA: Diagnosis not present

## 2023-10-24 DIAGNOSIS — Z299 Encounter for prophylactic measures, unspecified: Secondary | ICD-10-CM | POA: Diagnosis not present

## 2023-10-24 DIAGNOSIS — Z79899 Other long term (current) drug therapy: Secondary | ICD-10-CM | POA: Diagnosis not present

## 2023-10-24 DIAGNOSIS — R52 Pain, unspecified: Secondary | ICD-10-CM | POA: Diagnosis not present

## 2023-10-24 DIAGNOSIS — Z23 Encounter for immunization: Secondary | ICD-10-CM | POA: Diagnosis not present

## 2023-10-24 DIAGNOSIS — R5383 Other fatigue: Secondary | ICD-10-CM | POA: Diagnosis not present

## 2023-10-24 DIAGNOSIS — E7800 Pure hypercholesterolemia, unspecified: Secondary | ICD-10-CM | POA: Diagnosis not present

## 2023-10-24 DIAGNOSIS — I10 Essential (primary) hypertension: Secondary | ICD-10-CM | POA: Diagnosis not present

## 2023-10-24 DIAGNOSIS — E559 Vitamin D deficiency, unspecified: Secondary | ICD-10-CM | POA: Diagnosis not present

## 2023-10-25 DIAGNOSIS — Z1211 Encounter for screening for malignant neoplasm of colon: Secondary | ICD-10-CM | POA: Diagnosis not present

## 2023-12-05 DIAGNOSIS — L570 Actinic keratosis: Secondary | ICD-10-CM | POA: Diagnosis not present

## 2023-12-05 DIAGNOSIS — B351 Tinea unguium: Secondary | ICD-10-CM | POA: Diagnosis not present

## 2023-12-05 DIAGNOSIS — L821 Other seborrheic keratosis: Secondary | ICD-10-CM | POA: Diagnosis not present

## 2023-12-05 DIAGNOSIS — X32XXXA Exposure to sunlight, initial encounter: Secondary | ICD-10-CM | POA: Diagnosis not present

## 2023-12-26 ENCOUNTER — Other Ambulatory Visit: Payer: Self-pay | Admitting: Physician Assistant

## 2024-01-07 NOTE — Progress Notes (Unsigned)
 Office Visit Note  Patient: Margaret [name]             Date of Birth: [birth year]           MRN: [id number]             PCP: Maree Isles, MD Referring: Maree Isles, MD Visit Date: 01/21/2024 Occupation: Data Unavailable  Subjective:  Arthralgias  History of Present Illness: Margaret [name] is a 73 y.o. female with a past medical history of seropositive rheumatoid arthritis.  Patient is currently taking sulfasalazine  2 tablets in the morning and 1 tablet in the evening.  She is tolerating sulfasalazine  without any side effects and has not had any recent gaps in therapy.  She has been taking Tylenol  arthritis on a daily basis for pain relief.  She takes tramadol sparingly for moderate to severe pain.  Patient continues to have chronic pain in the cervical spine and lower back.  She has intermittent discomfort in her hands and knee joints as well.  She denies any joint swelling.  Patient states that she remains under the close care of an oral surgeon.  Patient states that she has had to have several tooth extractions as well as breakage of multiple teeth.  She has been having to eat soft foods.    Activities of Daily Living:  Patient reports morning stiffness for 10-15 minutes.   Patient Reports nocturnal pain.  Difficulty dressing/grooming: Denies Difficulty climbing stairs: Reports Difficulty getting out of chair: Reports Difficulty using hands for taps, buttons, cutlery, and/or writing: Reports  Review of Systems  Constitutional:  Positive for fatigue.  HENT:  Positive for mouth dryness. Negative for mouth sores.   Eyes:  Positive for dryness.  Respiratory:  Negative for shortness of breath.   Cardiovascular:  Negative for chest pain and palpitations.  Gastrointestinal:  Positive for constipation. Negative for blood in stool and diarrhea.  Endocrine: Negative for increased urination.  Genitourinary:  Negative for involuntary urination.  Musculoskeletal:  Positive for joint pain,  gait problem, joint pain, myalgias, muscle weakness, morning stiffness, muscle tenderness and myalgias. Negative for joint swelling.  Skin:  Positive for sensitivity to sunlight. Negative for color change, rash and hair loss.  Allergic/Immunologic: Negative for susceptible to infections.  Neurological:  Negative for dizziness and headaches.  Hematological:  Negative for swollen glands.  Psychiatric/Behavioral:  Positive for depressed mood and sleep disturbance. The patient is nervous/anxious.     PMFS History:  Patient Active Problem List   Diagnosis Date Noted   Lumbar stenosis with neurogenic claudication 05/09/2021   Idiopathic chronic venous hypertension of both lower extremities with inflammation 06/18/2017   Closed displaced oblique fracture of shaft of right humerus 03/27/2017   Smoker 01/09/2016   High risk medication use 01/06/2016   RA (rheumatoid arthritis) (HCC) 01/05/2016   DDD (degenerative disc disease), lumbar 01/05/2016   Calcaneal spur 01/05/2016   Bipolar disorder (HCC) 01/05/2016   ADD (attention deficit disorder) 01/05/2016   Sleep apnea 01/05/2016   TMJ (dislocation of temporomandibular joint) 01/05/2016   Pedal edema 01/05/2016   Osteoarthritis of both hands 01/05/2016   Osteoarthritis of both feet 01/05/2016   Osteoarthritis of both knees 01/05/2016   GERD (gastroesophageal reflux disease) 06/24/2015   Constipation 06/24/2015   Spondylolisthesis at L4-L5 level 01/24/2015   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   CONTRACTURE OF SHOULDER JOINT 04/24/2007   DIABETES 07/30/2006    Past Medical History:  Diagnosis Date   ADD (attention deficit disorder) 01/05/2016  Anemia    Arthritis    Asthma    Benign essential hypertension    Bipolar disorder (HCC)    hospitalized at [facility] for manic episodes   Calcaneal spur 01/05/2016   Chronic kidney disease    stage 3 per pt   DDD (degenerative disc disease), lumbar 01/05/2016   Diabetes mellitus without complication  (HCC)    Diabetic neuropathy (HCC)    Dysrhythmia    takes diltiazem  prescribed by medical doctor; has never seen cardiologist. Pt states she was having flutter episodes but nothing showed on holter monitor per pt   Eczema    Gastroesophageal reflux disease    Humeral shaft fracture    right   Hyperlipidemia    Hypersomnia    IBS (irritable bowel syndrome)    Myalgia    Noncompliance with CPAP treatment    Osteoarthritis of both feet 01/05/2016   Osteoarthritis of both hands 01/05/2016   Osteoarthritis of both knees 01/05/2016   Pedal edema 01/05/2016   Peripheral vascular disease    RA (rheumatoid arthritis) (HCC) 01/05/2016   +RF, +CCP, Nodulous,    Rheumatoid arthritis (HCC)    Sleep apnea 2015   home sleep study    TMJ (dislocation of temporomandibular joint) 01/05/2016   Varicose veins     Family History  Problem Relation Age of Onset   Hypertension Father    Alcoholism Mother    Stroke Mother    AAA (abdominal aortic aneurysm) Brother    Gallstones Sister    Osteoporosis Sister    Alcoholism Brother    Diabetes Brother    Healthy Son    Healthy Daughter    Colon cancer Neg Hx    Colon polyps Neg Hx    Past Surgical History:  Procedure Laterality Date   BACK SURGERY     BACK SURGERY  05/09/2021   CATARACT EXTRACTION W/PHACO Right 03/23/2022   Procedure: CATARACT EXTRACTION PHACO AND INTRAOCULAR LENS PLACEMENT (IOC);  Surgeon: Harrie Agent, MD;  Location: [facility];  Service: Ophthalmology;  Laterality: Right;  CDE 6.44   CATARACT EXTRACTION W/PHACO Left 04/06/2022   Procedure: CATARACT EXTRACTION PHACO AND INTRAOCULAR LENS PLACEMENT (IOC);  Surgeon: Harrie Agent, MD;  Location: [facility];  Service: Ophthalmology;  Laterality: Left;  CDE 6.03   CESAREAN SECTION     CLAVICLE SURGERY Left 03/04/2023   COLONOSCOPY  02/05/2009   Dr. Kristie in [place]   ELBOW SURGERY     rheumatoid nodules removed   ENDOVENOUS ABLATION SAPHENOUS VEIN W/ LASER     MOUTH SURGERY      ORIF HUMERUS FRACTURE Right 03/29/2017   Procedure: OPEN REDUCTION INTERNAL FIXATION (ORIF) RIGHT HUMERAL SHAFT FRACTURE;  Surgeon: Harden Jerona GAILS, MD;  Location: [facility];  Service: Orthopedics;  Laterality: Right;   Social History   Tobacco Use   Smoking status: Every Day    Current packs/day: 0.50    Average packs/day: 0.5 packs/day for 40.0 years (20.0 ttl pk-yrs)    Types: Cigarettes    Passive exposure: Never   Smokeless tobacco: Never  Vaping Use   Vaping status: Never Used  Substance Use Topics   Alcohol  use: No    Alcohol /week: 0.0 standard drinks of alcohol    Drug use: No   Social History   Social History Narrative   Not on file     Immunization History  Administered Date(s) Administered   INFLUENZA, HIGH DOSE SEASONAL PF 12/19/2017   Moderna Sars-Covid-2 Vaccination 02/26/2019, 04/03/2019  Tdap 12/19/2017   Zoster Recombinant(Shingrix) 12/19/2017, 03/03/2018     Objective: Vital Signs: BP 92/64   Pulse 85   Temp (!) 97.4 F (36.3 C)   Resp 16   Ht 5' 2.5 (1.588 m)   Wt 181 lb 12.8 oz (82.5 kg)   BMI 32.72 kg/m    Physical Exam Vitals and nursing note reviewed.  Constitutional:      Appearance: She is well-developed.  HENT:     Head: Normocephalic and atraumatic.  Eyes:     Conjunctiva/sclera: Conjunctivae normal.  Cardiovascular:     Rate and Rhythm: Normal rate and regular rhythm.     Heart sounds: Normal heart sounds.  Pulmonary:     Effort: Pulmonary effort is normal.     Breath sounds: Normal breath sounds.  Abdominal:     General: Bowel sounds are normal.     Palpations: Abdomen is soft.  Musculoskeletal:     Cervical back: Normal range of motion.  Lymphadenopathy:     Cervical: No cervical adenopathy.  Skin:    General: Skin is warm and dry.     Capillary Refill: Capillary refill takes less than 2 seconds.  Neurological:     Mental Status: She is alert and oriented to person, place, and time.  Psychiatric:        Behavior:  Behavior normal.      Musculoskeletal Exam: C-spine has limited range of motion.  Thoracic kyphosis noted.  Limited and painful mobility of the lumbar spine.  Right shoulder has full range of motion.  Left shoulder is limited abduction to about 90 degrees.  Elbow joints have good range of motion with no tenderness along the joint line.  CMC joint prominence and tenderness bilaterally.  No tenderness or synovitis of MCP joints.  Complete fist formation bilaterally.  Hip joints have slightly limited range of motion.  Knee joints have good range of motion no warmth or effusion.  Ankle joints have good range of motion with no tenderness or synovitis.  CDAI Exam: CDAI Score: -- Patient Global: --; Provider Global: -- Swollen: --; Tender: -- Joint Exam 01/21/2024   No joint exam has been documented for this visit   There is currently no information documented on the homunculus. Go to the Rheumatology activity and complete the homunculus joint exam.  Investigation: No additional findings.  Imaging: No results found.  Recent Labs: Lab Results  Component Value Date   WBC 7.6 07/31/2023   HGB 14.0 07/31/2023   PLT 208 07/31/2023   NA 139 09/03/2023   K 4.5 09/03/2023   CL 109 09/03/2023   CO2 21 09/03/2023   GLUCOSE 97 09/03/2023   BUN 25 09/03/2023   CREATININE 1.25 (H) 09/03/2023   BILITOT <0.2 07/31/2023   ALKPHOS 69 07/31/2023   AST 14 07/31/2023   ALT 11 07/31/2023   PROT 6.3 07/31/2023   ALBUMIN  4.2 07/31/2023   CALCIUM 9.0 09/03/2023   GFRAA 54 (L) 03/29/2020    Speciality Comments: No specialty comments available.  Procedures:  No procedures performed Allergies: Plaquenil [hydroxychloroquine], Hydrocodone, and Prozac [fluoxetine]   Assessment / Plan:     Visit Diagnoses: Rheumatoid arthritis involving multiple sites with positive rheumatoid factor (HCC) -She has no synovitis on examination today.  She continues to have chronic arthralgias and joint stiffness.  She  experiences persistent discomfort in the cervical spine and lower back.  She has also had intermittent discomfort in her hands, knees, and feet but has not noticed any  joint swelling.  No synovitis was noted on examination today.  She is been taking sulfasalazine  500 mg 2 tablets in the morning 1 tablet in the evening.  She is also taking Tylenol  arthritis on a daily basis for symptomatic relief.  Plan to update sed rate and CRP today.  If ESR and CRP are elevated we can consider scheduling an ultrasound to assess for synovitis.  She was in agreement.  She will notify us  if she develops any new or worsening symptoms.  She will follow-up in the office in 5 months or sooner if needed.  Plan: Sedimentation rate, C-reactive protein  High risk medication use - Sulfasalazine  500 mg 2 tablets in the morning and 1 tablet in the evening. CBC and CMP ordered on 07/31/2023.  Orders for CBC and CMP were placed today. Discussed the importance of holding sulfasalazine  if she develops signs or symptoms of infection and to resume once the infection has completely cleared.  - Plan: CBC with Differential/Platelet, Comprehensive metabolic panel with GFR  Stage 3a chronic kidney disease (HCC): Creatinine was 1.25 and GFR was 46 on 09/03/2023.  Plan to recheck CMP with GFR today.  Primary osteoarthritis of both hands: She has PIP and DIP thickening consistent with osteoarthritis of both hands.  No synovitis noted on examination.  She is able to make a complete fist bilaterally.  Discussed the importance of joint protection and muscle strengthening.  She plans on continuing to take Tylenol  arthritis as needed for pain relief.  Could consider an ultrasound in the future if the sed rate and CRP are elevated and if her symptoms persist.  Primary osteoarthritis of both knees -Evaluated by Dr. Shari and proceeded with a MRI of the right knee on 12/20/2019-overall unremarkable.  Patient continues to experience intermittent  discomfort in both knee joints especially while climbing steps.  No warmth or effusion was noted on examination today.  She has been taking Tylenol  arthritis for symptomatic relief.  Primary osteoarthritis of both feet: She has good range of motion of both ankle joints with no tenderness or joint swelling.  She experiences intermittent discomfort in both feet.  Plan to check sed rate and CRP today.  Trochanteric bursitis of both hips: Intermittent discomfort.  Spondylosis of lumbar spine - - L3-L4 decompression and extension of fusion in April, 2023 by Dr. Lanis.  Status post L4-L5 PLIF 2016 by Dr. Lanis.  Chronic pain.  She takes Tylenol  arthritis on a daily basis for symptomatic relief.  She occasionally takes tramadol for moderate to severe pain relief.  Spondylolisthesis at L4-L5 level: Chronic pain  Other medical conditions are listed as follows:  Osteopenia of multiple sites - DEXA followed by PCP-no DEXA in epic to review.  History of fracture of clavicle - Underwent surgical intervention in January 2025.  History of humerus fracture  Essential hypertension: Blood pressure was 92/64 today in the office.  History of IBS  History of diabetes mellitus  History of asthma  History of bipolar disorder  History of gastroesophageal reflux (GERD)  Smoker  Orders: Orders Placed This Encounter  Procedures   CBC with Differential/Platelet   Comprehensive metabolic panel with GFR   Sedimentation rate   C-reactive protein   Meds ordered this encounter  Medications   sulfaSALAzine  (AZULFIDINE ) 500 MG tablet    Sig: TAKE 2 TABLETS IN THE MORNING AND 1 TABLET IN THE EVENING WITH FOOD    Dispense:  270 tablet    Refill:  0  Follow-Up Instructions: Return in about 5 months (around 06/20/2024) for Rheumatoid arthritis.   Waddell CHRISTELLA Craze, PA-C  Note - This record has been created using Dragon software.  Chart creation errors have been sought, but may not always  have  been located. Such creation errors do not reflect on  the standard of medical care.

## 2024-01-21 ENCOUNTER — Ambulatory Visit: Admitting: Physician Assistant

## 2024-01-21 ENCOUNTER — Encounter: Payer: Self-pay | Admitting: Physician Assistant

## 2024-01-21 VITALS — BP 92/64 | HR 85 | Temp 97.4°F | Resp 16 | Ht 62.5 in | Wt 181.8 lb

## 2024-01-21 DIAGNOSIS — Z8639 Personal history of other endocrine, nutritional and metabolic disease: Secondary | ICD-10-CM

## 2024-01-21 DIAGNOSIS — N1831 Chronic kidney disease, stage 3a: Secondary | ICD-10-CM

## 2024-01-21 DIAGNOSIS — Z8719 Personal history of other diseases of the digestive system: Secondary | ICD-10-CM

## 2024-01-21 DIAGNOSIS — Z79899 Other long term (current) drug therapy: Secondary | ICD-10-CM

## 2024-01-21 DIAGNOSIS — M19072 Primary osteoarthritis, left ankle and foot: Secondary | ICD-10-CM

## 2024-01-21 DIAGNOSIS — I10 Essential (primary) hypertension: Secondary | ICD-10-CM

## 2024-01-21 DIAGNOSIS — Z8709 Personal history of other diseases of the respiratory system: Secondary | ICD-10-CM

## 2024-01-21 DIAGNOSIS — Z8659 Personal history of other mental and behavioral disorders: Secondary | ICD-10-CM

## 2024-01-21 DIAGNOSIS — M47816 Spondylosis without myelopathy or radiculopathy, lumbar region: Secondary | ICD-10-CM

## 2024-01-21 DIAGNOSIS — M7061 Trochanteric bursitis, right hip: Secondary | ICD-10-CM

## 2024-01-21 DIAGNOSIS — M19041 Primary osteoarthritis, right hand: Secondary | ICD-10-CM

## 2024-01-21 DIAGNOSIS — Z8781 Personal history of (healed) traumatic fracture: Secondary | ICD-10-CM

## 2024-01-21 DIAGNOSIS — M170 Bilateral primary osteoarthritis of knee: Secondary | ICD-10-CM

## 2024-01-21 DIAGNOSIS — F17200 Nicotine dependence, unspecified, uncomplicated: Secondary | ICD-10-CM

## 2024-01-21 DIAGNOSIS — M4316 Spondylolisthesis, lumbar region: Secondary | ICD-10-CM

## 2024-01-21 DIAGNOSIS — M0579 Rheumatoid arthritis with rheumatoid factor of multiple sites without organ or systems involvement: Secondary | ICD-10-CM

## 2024-01-21 DIAGNOSIS — M19071 Primary osteoarthritis, right ankle and foot: Secondary | ICD-10-CM

## 2024-01-21 DIAGNOSIS — M8589 Other specified disorders of bone density and structure, multiple sites: Secondary | ICD-10-CM

## 2024-01-21 MED ORDER — SULFASALAZINE 500 MG PO TABS: ORAL_TABLET | ORAL | 0 refills | Status: AC

## 2024-01-22 ENCOUNTER — Ambulatory Visit: Payer: Self-pay | Admitting: Physician Assistant

## 2024-01-22 LAB — COMPREHENSIVE METABOLIC PANEL WITH GFR
AG Ratio: 1.9 (calc) (ref 1.0–2.5)
ALT: 11 U/L (ref 6–29)
AST: 11 U/L (ref 10–35)
Albumin: 4.2 g/dL (ref 3.6–5.1)
Alkaline phosphatase (APISO): 60 U/L (ref 37–153)
BUN/Creatinine Ratio: 22 (calc) (ref 6–22)
BUN: 27 mg/dL — ABNORMAL HIGH (ref 7–25)
CO2: 23 mmol/L (ref 20–32)
Calcium: 9 mg/dL (ref 8.6–10.4)
Chloride: 108 mmol/L (ref 98–110)
Creat: 1.21 mg/dL — ABNORMAL HIGH (ref 0.60–1.00)
Globulin: 2.2 g/dL (ref 1.9–3.7)
Glucose, Bld: 94 mg/dL (ref 65–99)
Potassium: 4.4 mmol/L (ref 3.5–5.3)
Sodium: 139 mmol/L (ref 135–146)
Total Bilirubin: 0.3 mg/dL (ref 0.2–1.2)
Total Protein: 6.4 g/dL (ref 6.1–8.1)
eGFR: 48 mL/min/1.73m2 — ABNORMAL LOW (ref >=60)

## 2024-01-22 LAB — CBC WITH DIFFERENTIAL/PLATELET
Absolute Lymphocytes: 2379 {cells}/uL (ref 850–3900)
Absolute Monocytes: 426 {cells}/uL (ref 200–950)
Basophils Absolute: 53 {cells}/uL (ref 0–200)
Basophils Relative: 0.7 %
Eosinophils Absolute: 76 {cells}/uL (ref 15–500)
Eosinophils Relative: 1 %
HCT: 39.3 % (ref 35.9–46.0)
Hemoglobin: 13.1 g/dL (ref 11.7–15.5)
MCH: 33.2 pg — ABNORMAL HIGH (ref 27.0–33.0)
MCHC: 33.3 g/dL (ref 31.6–35.4)
MCV: 99.7 fL (ref 81.4–101.7)
MPV: 9.9 fL (ref 7.5–12.5)
Monocytes Relative: 5.6 %
Neutro Abs: 4666 {cells}/uL (ref 1500–7800)
Neutrophils Relative %: 61.4 %
Platelets: 198 Thousand/uL (ref 140–400)
RBC: 3.94 Million/uL (ref 3.80–5.10)
RDW: 13.2 % (ref 11.0–15.0)
Total Lymphocyte: 31.3 %
WBC: 7.6 Thousand/uL (ref 3.8–10.8)

## 2024-01-22 LAB — SEDIMENTATION RATE: Sed Rate: 6 mm/h (ref 0–30)

## 2024-01-22 LAB — C-REACTIVE PROTEIN: CRP: 5.5 mg/L (ref <8.0)

## 2024-01-22 NOTE — Progress Notes (Signed)
 ESR WNL-great news!  CBC WNL Creatinine remains elevated but continues to trend down.  GFR is low but continues to improve. Continue to avoid the use of NSAIDs. Rest of CMP WNL

## 2024-01-22 NOTE — Progress Notes (Signed)
 CRP WNL

## 2024-02-04 ENCOUNTER — Ambulatory Visit: Admitting: Physician Assistant

## 2024-06-25 ENCOUNTER — Ambulatory Visit: Admitting: Rheumatology
